# Patient Record
Sex: Male | Born: 1955 | Race: White | Hispanic: No | Marital: Married | Smoking: Never smoker
Health system: Southern US, Community
[De-identification: ages and names within clinical notes are randomized; demographics above are authoritative.]

## PROBLEM LIST (undated history)

## (undated) DIAGNOSIS — I10 Essential (primary) hypertension: Secondary | ICD-10-CM

## (undated) DIAGNOSIS — N289 Disorder of kidney and ureter, unspecified: Secondary | ICD-10-CM

## (undated) DIAGNOSIS — IMO0001 Reserved for inherently not codable concepts without codable children: Secondary | ICD-10-CM

## (undated) DIAGNOSIS — G4733 Obstructive sleep apnea (adult) (pediatric): Secondary | ICD-10-CM

## (undated) DIAGNOSIS — R748 Abnormal levels of other serum enzymes: Secondary | ICD-10-CM

## (undated) DIAGNOSIS — R161 Splenomegaly, not elsewhere classified: Secondary | ICD-10-CM

## (undated) DIAGNOSIS — R3915 Urgency of urination: Secondary | ICD-10-CM

## (undated) DIAGNOSIS — I351 Nonrheumatic aortic (valve) insufficiency: Secondary | ICD-10-CM

## (undated) DIAGNOSIS — E781 Pure hyperglyceridemia: Secondary | ICD-10-CM

## (undated) DIAGNOSIS — M1990 Unspecified osteoarthritis, unspecified site: Secondary | ICD-10-CM

## (undated) DIAGNOSIS — R7303 Prediabetes: Secondary | ICD-10-CM

## (undated) DIAGNOSIS — I2510 Atherosclerotic heart disease of native coronary artery without angina pectoris: Secondary | ICD-10-CM

## (undated) DIAGNOSIS — Z9989 Dependence on other enabling machines and devices: Secondary | ICD-10-CM

## (undated) DIAGNOSIS — Z87442 Personal history of urinary calculi: Secondary | ICD-10-CM

## (undated) DIAGNOSIS — N5089 Other specified disorders of the male genital organs: Secondary | ICD-10-CM

## (undated) HISTORY — PX: UVULOPALATOPHARYNGOPLASTY: SHX827

## (undated) HISTORY — PX: APPENDECTOMY: SHX54

## (undated) HISTORY — PX: TONSILLECTOMY: SUR1361

## (undated) HISTORY — PX: COLONOSCOPY: SHX174

## (undated) HISTORY — PX: LITHOTRIPSY: SUR834

---

## 2005-06-23 ENCOUNTER — Ambulatory Visit: Admission: RE | Admit: 2005-06-23 | Discharge: 2005-06-23 | Payer: Self-pay | Admitting: Otolaryngology

## 2005-07-03 ENCOUNTER — Ambulatory Visit: Payer: Self-pay | Admitting: Pulmonary Disease

## 2006-04-01 ENCOUNTER — Ambulatory Visit (HOSPITAL_COMMUNITY): Admission: RE | Admit: 2006-04-01 | Discharge: 2006-04-01 | Payer: Self-pay | Admitting: Unknown Physician Specialty

## 2006-12-29 ENCOUNTER — Ambulatory Visit: Payer: Self-pay | Admitting: Internal Medicine

## 2006-12-29 ENCOUNTER — Encounter (INDEPENDENT_AMBULATORY_CARE_PROVIDER_SITE_OTHER): Payer: Self-pay | Admitting: *Deleted

## 2006-12-29 ENCOUNTER — Ambulatory Visit (HOSPITAL_COMMUNITY): Admission: RE | Admit: 2006-12-29 | Discharge: 2006-12-29 | Payer: Self-pay | Admitting: Internal Medicine

## 2008-11-18 HISTORY — PX: KNEE ARTHROSCOPY: SUR90

## 2010-01-15 ENCOUNTER — Inpatient Hospital Stay (HOSPITAL_COMMUNITY): Admission: EM | Admit: 2010-01-15 | Discharge: 2010-01-20 | Payer: Self-pay | Admitting: Emergency Medicine

## 2010-01-15 ENCOUNTER — Ambulatory Visit (HOSPITAL_COMMUNITY): Admission: RE | Admit: 2010-01-15 | Discharge: 2010-01-15 | Payer: Self-pay | Admitting: Preventative Medicine

## 2010-01-16 ENCOUNTER — Encounter (INDEPENDENT_AMBULATORY_CARE_PROVIDER_SITE_OTHER): Payer: Self-pay | Admitting: General Surgery

## 2010-01-16 HISTORY — PX: OTHER SURGICAL HISTORY: SHX169

## 2010-07-17 ENCOUNTER — Encounter (HOSPITAL_COMMUNITY): Admission: RE | Admit: 2010-07-17 | Discharge: 2010-08-16 | Payer: Self-pay | Admitting: Orthopedic Surgery

## 2010-07-26 ENCOUNTER — Ambulatory Visit (HOSPITAL_COMMUNITY): Admission: RE | Admit: 2010-07-26 | Discharge: 2010-07-26 | Payer: Self-pay | Admitting: General Surgery

## 2010-08-20 ENCOUNTER — Ambulatory Visit (HOSPITAL_COMMUNITY): Admission: RE | Admit: 2010-08-20 | Discharge: 2010-08-20 | Payer: Self-pay | Admitting: Orthopedic Surgery

## 2010-09-12 ENCOUNTER — Encounter (HOSPITAL_COMMUNITY): Admission: RE | Admit: 2010-09-12 | Discharge: 2010-10-12 | Payer: Self-pay | Admitting: Orthopedic Surgery

## 2010-10-01 ENCOUNTER — Emergency Department (HOSPITAL_COMMUNITY): Admission: EM | Admit: 2010-10-01 | Discharge: 2010-10-02 | Payer: Self-pay | Admitting: Emergency Medicine

## 2010-10-02 ENCOUNTER — Ambulatory Visit (HOSPITAL_COMMUNITY): Admission: RE | Admit: 2010-10-02 | Discharge: 2010-10-02 | Payer: Self-pay | Admitting: Urology

## 2010-10-03 ENCOUNTER — Emergency Department (HOSPITAL_COMMUNITY): Admission: EM | Admit: 2010-10-03 | Discharge: 2010-10-03 | Payer: Self-pay | Admitting: Emergency Medicine

## 2010-11-27 ENCOUNTER — Ambulatory Visit (HOSPITAL_COMMUNITY)
Admission: RE | Admit: 2010-11-27 | Discharge: 2010-11-27 | Payer: Self-pay | Source: Home / Self Care | Attending: Urology | Admitting: Urology

## 2010-11-30 ENCOUNTER — Ambulatory Visit (HOSPITAL_COMMUNITY)
Admission: RE | Admit: 2010-11-30 | Discharge: 2010-11-30 | Payer: Self-pay | Source: Home / Self Care | Attending: Urology | Admitting: Urology

## 2011-01-29 LAB — URINE MICROSCOPIC-ADD ON

## 2011-01-29 LAB — URINALYSIS, ROUTINE W REFLEX MICROSCOPIC
Leukocytes, UA: NEGATIVE
Nitrite: NEGATIVE
Specific Gravity, Urine: 1.02 (ref 1.005–1.030)
Urobilinogen, UA: 0.2 mg/dL (ref 0.0–1.0)

## 2011-02-07 LAB — CROSSMATCH
ABO/RH(D): A NEG
Antibody Screen: NEGATIVE

## 2011-02-07 LAB — CBC
MCHC: 35.1 g/dL (ref 30.0–36.0)
MCV: 92.5 fL (ref 78.0–100.0)
Platelets: 260 10*3/uL (ref 150–400)

## 2011-02-07 LAB — DIFFERENTIAL
Eosinophils Relative: 0 % (ref 0–5)
Lymphocytes Relative: 17 % (ref 12–46)
Lymphs Abs: 2 10*3/uL (ref 0.7–4.0)
Neutro Abs: 7.9 10*3/uL — ABNORMAL HIGH (ref 1.7–7.7)

## 2011-02-07 LAB — URINALYSIS, ROUTINE W REFLEX MICROSCOPIC
Ketones, ur: NEGATIVE mg/dL
Leukocytes, UA: NEGATIVE
Nitrite: NEGATIVE

## 2011-02-07 LAB — COMPREHENSIVE METABOLIC PANEL
AST: 43 U/L — ABNORMAL HIGH (ref 0–37)
Albumin: 3.8 g/dL (ref 3.5–5.2)
CO2: 26 mEq/L (ref 19–32)
Calcium: 9.4 mg/dL (ref 8.4–10.5)
Creatinine, Ser: 1.33 mg/dL (ref 0.4–1.5)
GFR calc Af Amer: >60 mL/min (ref >60)
GFR calc non Af Amer: 56 mL/min — ABNORMAL LOW (ref >60)

## 2011-02-07 LAB — ABO/RH: ABO/RH(D): A NEG

## 2011-02-11 LAB — BASIC METABOLIC PANEL
BUN: 11 mg/dL (ref 6–23)
BUN: 9 mg/dL (ref 6–23)
CO2: 29 mEq/L (ref 19–32)
CO2: 31 mEq/L (ref 19–32)
Calcium: 8.6 mg/dL (ref 8.4–10.5)
Chloride: 100 mEq/L (ref 96–112)
Chloride: 103 mEq/L (ref 96–112)
Creatinine, Ser: 1.02 mg/dL (ref 0.4–1.5)
Creatinine, Ser: 1.05 mg/dL (ref 0.4–1.5)
GFR calc Af Amer: >60 mL/min (ref >60)
GFR calc Af Amer: >60 mL/min (ref >60)
GFR calc non Af Amer: >60 mL/min (ref >60)
Glucose, Bld: 109 mg/dL — ABNORMAL HIGH (ref 70–99)
Potassium: 4.1 mEq/L (ref 3.5–5.1)
Sodium: 137 mEq/L (ref 135–145)

## 2011-02-11 LAB — CBC
HCT: 37.6 % — ABNORMAL LOW (ref 39.0–52.0)
Hemoglobin: 14.2 g/dL (ref 13.0–17.0)
MCHC: 34.7 g/dL (ref 30.0–36.0)
MCV: 93.1 fL (ref 78.0–100.0)
MCV: 93.3 fL (ref 78.0–100.0)
Platelets: 210 10*3/uL (ref 150–400)
Platelets: 300 10*3/uL (ref 150–400)
RBC: 3.79 MIL/uL — ABNORMAL LOW (ref 4.22–5.81)
RBC: 4.38 MIL/uL (ref 4.22–5.81)
RDW: 12.8 % (ref 11.5–15.5)
WBC: 9.3 10*3/uL (ref 4.0–10.5)

## 2011-02-11 LAB — DIFFERENTIAL
Basophils Absolute: 0 10*3/uL (ref 0.0–0.1)
Basophils Absolute: 0 10*3/uL (ref 0.0–0.1)
Basophils Relative: 0 % (ref 0–1)
Basophils Relative: 0 % (ref 0–1)
Eosinophils Absolute: 0.1 10*3/uL (ref 0.0–0.7)
Eosinophils Absolute: 0.1 10*3/uL (ref 0.0–0.7)
Eosinophils Relative: 1 % (ref 0–5)
Lymphocytes Relative: 17 % (ref 12–46)
Monocytes Absolute: 1 10*3/uL (ref 0.1–1.0)
Monocytes Absolute: 1 10*3/uL (ref 0.1–1.0)
Monocytes Absolute: 1 10*3/uL (ref 0.1–1.0)
Monocytes Relative: 11 % (ref 3–12)
Monocytes Relative: 16 % — ABNORMAL HIGH (ref 3–12)

## 2011-02-11 LAB — PHOSPHORUS: Phosphorus: 3.2 mg/dL (ref 2.3–4.6)

## 2011-02-11 LAB — ALBUMIN: Albumin: 2.8 g/dL — ABNORMAL LOW (ref 3.5–5.2)

## 2011-04-05 NOTE — Op Note (Signed)
NAME:  [name], [name]              ACCOUNT NO.:  [id number]   MEDICAL RECORD NO.:  [id number]          PATIENT TYPE:  AMB   LOCATION:  DAY                           FACILITY:  [facility]   PHYSICIAN:  R. Roetta Sessions, M.D. DATE OF BIRTH:  1956-01-22   DATE OF PROCEDURE:  12/29/2006  DATE OF DISCHARGE:                               OPERATIVE REPORT   PROCEDURE:  Screening colonoscopy, cold biopsy.   INDICATIONS FOR PROCEDURE:  The patient is a 55 year old gentleman sent  over courtesy Dr. Yetta Numbers for colorectal cancer screening.  He is  devoid of any lower GI tract symptoms, he has never had his lower GI  tract imaged.  There is no family history colorectal neoplasia.  Colonoscopy is now being done as screening maneuver.  This approach has  discussed the patient at length.  Potential risks, benefits and was have  been reviewed, questions answered, he is agreeable.  Please see  documentation in the medical record.   PROCEDURE NOTE:  O2 saturation, blood pressure, pulse and respirations  monitored throughout the entire procedure.   CONSCIOUS SEDATION:  Versed 5 mg IV, Demerol 100 grams IV in divided  doses.   INSTRUMENT USED:  Pentax video chip system.   FINDINGS:  Digital rectal exam revealed no abnormalities.   ENDOSCOPIC FINDINGS:  Prep was good.  Examination of colonic mucosa was  undertaken from the rectosigmoid junction where scope was advanced  through the left, transverse, right colon to area of appendiceal  orifice, ileocecal valve and cecum.  These structures well seen  photographed for the record.  From this level scope was slowly  withdrawn.  All previously mucosal surfaces were again seen.  The  patient was noted have a diminutive 3 mm polyp in the mid sigmoid which  was cold biopsied/removed.  The remainder of colonic mucosa appeared  normal.  Scope was pulled down to the rectum.  Thorough examination of  rectal mucosa including a retroflex view of anal verge was  undertaken.  The patient had normal-appearing rectal mucosa.  The patient tolerated  the procedure well and was reacted in endoscopy.   IMPRESSION:  Normal rectum.  Diminutive sigmoid polyp, status post cold  biopsy removal.  Remainder of colon mucosa appeared normal.   RECOMMENDATIONS:  1. Follow-up on path.  2. Further recommendations to follow.      Jonathon Bellows, M.D.  Electronically Signed     RMR/MEDQ  D:  12/29/2006  T:  12/29/2006  Job:  [id number]   cc:   Kirk Ruths, M.D.  Fax: [phone]

## 2011-04-05 NOTE — Procedures (Signed)
NAME:  [name], [name]              ACCOUNT NO.:  [id number]   MEDICAL RECORD NO.:  [id number]          PATIENT TYPE:  OUT   LOCATION:  SLEEP LAB                     FACILITY:  [facility]   PHYSICIAN:  Marcelyn Bruins, M.D. [facility] DATE OF BIRTH:  26-Oct-1956   DATE OF STUDY:  06/23/2005                              NOCTURNAL POLYSOMNOGRAM   REFERRING PHYSICIAN:  Lucky Cowboy, MD   DATE OF STUDY:  June 23, 2005.   INDICATIONS FOR STUDY:  Hypersomnia with sleep apnea.   EPWORTH SCORE:  10.   SLEEP ARCHITECTURE:  The patient has total sleep time of 265 minutes with  adequate slow wave sleep but decreased REM. Sleep onset was prolonged at 45  minutes, and REM onset was fairly short at 48 minutes. Sleep efficiency was  only 69%.   IMPRESSION:  1.  Split night study reveals moderate obstructive sleep apnea with 59      obstructive events in the first 103 minutes of sleep. This gave the      patient a respiratory disturbance index of 34 events per hour and O2      desaturation as low as 85%. Events were not positional, but they were      associated with moderate to loud snoring. By protocol, the patient was      then placed on a medium Respironics mask of unknown style. CPAP pressure      was then started, and ultimately the patient required 7 cm of water      pressure in order to control both obstructive events and snoring.  2.  No clinically significant cardiac arrhythmias.                                            ______________________________  Marcelyn Bruins, M.D. [facility]  Diplomate, American Board of Sleep  Medicine     KC/MEDQ  D:  07/03/2005 11:39:14  T:  07/03/2005 12:33:00  Job:  [id number]

## 2011-09-19 ENCOUNTER — Ambulatory Visit: Payer: BC Managed Care – PPO | Attending: Otolaryngology | Admitting: Audiology

## 2011-09-19 DIAGNOSIS — Z0389 Encounter for observation for other suspected diseases and conditions ruled out: Secondary | ICD-10-CM | POA: Insufficient documentation

## 2011-09-19 DIAGNOSIS — Z0110 Encounter for examination of ears and hearing without abnormal findings: Secondary | ICD-10-CM | POA: Insufficient documentation

## 2014-03-10 ENCOUNTER — Other Ambulatory Visit: Payer: Self-pay | Admitting: Urology

## 2014-03-16 ENCOUNTER — Encounter (HOSPITAL_BASED_OUTPATIENT_CLINIC_OR_DEPARTMENT_OTHER): Payer: Self-pay | Admitting: *Deleted

## 2014-03-16 NOTE — Progress Notes (Signed)
NPO AFTER MN. ARRIVE AT 8469. NEEDS HG. WILL TAKE FENOFIBRATE AM DOS W/ SIPS OF WATER.

## 2014-03-18 ENCOUNTER — Encounter (HOSPITAL_BASED_OUTPATIENT_CLINIC_OR_DEPARTMENT_OTHER)
Admission: RE | Disposition: A | Discharge status: Home / Self Care | Payer: Self-pay | Source: Ambulatory Visit | Attending: Urology

## 2014-03-18 ENCOUNTER — Ambulatory Visit (HOSPITAL_BASED_OUTPATIENT_CLINIC_OR_DEPARTMENT_OTHER): Payer: BC Managed Care – PPO | Admitting: Anesthesiology

## 2014-03-18 ENCOUNTER — Encounter (HOSPITAL_BASED_OUTPATIENT_CLINIC_OR_DEPARTMENT_OTHER): Payer: BC Managed Care – PPO | Admitting: Anesthesiology

## 2014-03-18 ENCOUNTER — Ambulatory Visit (HOSPITAL_BASED_OUTPATIENT_CLINIC_OR_DEPARTMENT_OTHER)
Admission: RE | Admit: 2014-03-18 | Discharge: 2014-03-18 | Disposition: A | Discharge status: Home / Self Care | Payer: BC Managed Care – PPO | Source: Ambulatory Visit | Attending: Urology | Admitting: Urology

## 2014-03-18 ENCOUNTER — Encounter (HOSPITAL_BASED_OUTPATIENT_CLINIC_OR_DEPARTMENT_OTHER): Payer: Self-pay

## 2014-03-18 DIAGNOSIS — E7800 Pure hypercholesterolemia, unspecified: Secondary | ICD-10-CM | POA: Insufficient documentation

## 2014-03-18 DIAGNOSIS — N4340 Spermatocele of epididymis, unspecified: Secondary | ICD-10-CM | POA: Insufficient documentation

## 2014-03-18 DIAGNOSIS — N433 Hydrocele, unspecified: Secondary | ICD-10-CM

## 2014-03-18 DIAGNOSIS — G4730 Sleep apnea, unspecified: Secondary | ICD-10-CM | POA: Insufficient documentation

## 2014-03-18 DIAGNOSIS — Z79899 Other long term (current) drug therapy: Secondary | ICD-10-CM | POA: Insufficient documentation

## 2014-03-18 HISTORY — DX: Pure hyperglyceridemia: E78.1

## 2014-03-18 HISTORY — DX: Other specified disorders of the male genital organs: N50.89

## 2014-03-18 HISTORY — DX: Obstructive sleep apnea (adult) (pediatric): Z99.89

## 2014-03-18 HISTORY — DX: Urgency of urination: R39.15

## 2014-03-18 HISTORY — DX: Obstructive sleep apnea (adult) (pediatric): G47.33

## 2014-03-18 HISTORY — PX: HYDROCELE EXCISION: SHX482

## 2014-03-18 LAB — POCT HEMOGLOBIN-HEMACUE: Hemoglobin: 15.1 g/dL (ref 13.0–17.0)

## 2014-03-18 SURGERY — HYDROCELECTOMY
Anesthesia: General | Site: Scrotum | Laterality: Right

## 2014-03-18 MED ORDER — CEFAZOLIN SODIUM-DEXTROSE 2-3 GM-% IV SOLR
2.0000 g | INTRAVENOUS | Status: AC
  Administered 2014-03-18: 2 g via INTRAVENOUS
  Filled 2014-03-18: qty 50

## 2014-03-18 MED ORDER — FENTANYL CITRATE 0.05 MG/ML IJ SOLN
25.0000 ug | INTRAMUSCULAR | Status: DC | PRN
  Administered 2014-03-18: 25 ug via INTRAVENOUS
  Administered 2014-03-18: 50 ug via INTRAVENOUS
  Filled 2014-03-18: qty 1

## 2014-03-18 MED ORDER — LACTATED RINGERS IV SOLN
INTRAVENOUS | Status: DC
  Administered 2014-03-18 (×2): via INTRAVENOUS
  Filled 2014-03-18: qty 1000

## 2014-03-18 MED ORDER — FENTANYL CITRATE 0.05 MG/ML IJ SOLN
INTRAMUSCULAR | Status: AC
  Filled 2014-03-18: qty 4

## 2014-03-18 MED ORDER — OXYCODONE-ACETAMINOPHEN 5-325 MG PO TABS
1.0000 | ORAL_TABLET | ORAL | Status: DC | PRN
  Administered 2014-03-18: 1 via ORAL
  Filled 2014-03-18: qty 2

## 2014-03-18 MED ORDER — ONDANSETRON HCL 4 MG/2ML IJ SOLN
INTRAMUSCULAR | Status: DC | PRN
  Administered 2014-03-18: 4 mg via INTRAVENOUS

## 2014-03-18 MED ORDER — BACITRACIN-NEOMYCIN-POLYMYXIN 400-5-5000 EX OINT
TOPICAL_OINTMENT | CUTANEOUS | Status: DC | PRN
  Administered 2014-03-18: 1 via TOPICAL

## 2014-03-18 MED ORDER — LIDOCAINE HCL (CARDIAC) 20 MG/ML IV SOLN
INTRAVENOUS | Status: DC | PRN
  Administered 2014-03-18: 60 mg via INTRAVENOUS

## 2014-03-18 MED ORDER — OXYCODONE-ACETAMINOPHEN 10-325 MG PO TABS: 1.0000 | ORAL_TABLET | ORAL | Status: DC | PRN

## 2014-03-18 MED ORDER — FENTANYL CITRATE 0.05 MG/ML IJ SOLN
INTRAMUSCULAR | Status: DC | PRN
  Administered 2014-03-18 (×4): 50 ug via INTRAVENOUS

## 2014-03-18 MED ORDER — MIDAZOLAM HCL 5 MG/5ML IJ SOLN
INTRAMUSCULAR | Status: DC | PRN
  Administered 2014-03-18: 2 mg via INTRAVENOUS

## 2014-03-18 MED ORDER — FENTANYL CITRATE 0.05 MG/ML IJ SOLN
INTRAMUSCULAR | Status: AC
  Filled 2014-03-18: qty 2

## 2014-03-18 MED ORDER — BUPIVACAINE-EPINEPHRINE 0.5% -1:200000 IJ SOLN
INTRAMUSCULAR | Status: DC | PRN
  Administered 2014-03-18: 10 mL

## 2014-03-18 MED ORDER — PROMETHAZINE HCL 25 MG/ML IJ SOLN
6.2500 mg | INTRAMUSCULAR | Status: DC | PRN
  Filled 2014-03-18: qty 1

## 2014-03-18 MED ORDER — KETOROLAC TROMETHAMINE 30 MG/ML IJ SOLN
INTRAMUSCULAR | Status: DC | PRN
  Administered 2014-03-18: 30 mg via INTRAVENOUS

## 2014-03-18 MED ORDER — KETOROLAC TROMETHAMINE 30 MG/ML IJ SOLN
15.0000 mg | Freq: Once | INTRAMUSCULAR | Status: DC | PRN
  Filled 2014-03-18: qty 1

## 2014-03-18 MED ORDER — MIDAZOLAM HCL 2 MG/2ML IJ SOLN
INTRAMUSCULAR | Status: AC
  Filled 2014-03-18: qty 2

## 2014-03-18 MED ORDER — DEXAMETHASONE SODIUM PHOSPHATE 10 MG/ML IJ SOLN
INTRAMUSCULAR | Status: DC | PRN
  Administered 2014-03-18: 10 mg via INTRAVENOUS

## 2014-03-18 MED ORDER — PROPOFOL 10 MG/ML IV BOLUS
INTRAVENOUS | Status: DC | PRN
  Administered 2014-03-18: 200 mg via INTRAVENOUS

## 2014-03-18 MED ORDER — OXYCODONE-ACETAMINOPHEN 5-325 MG PO TABS
ORAL_TABLET | ORAL | Status: AC
  Filled 2014-03-18: qty 1

## 2014-03-18 MED ORDER — SODIUM CHLORIDE 0.9 % IR SOLN
Status: DC | PRN
  Administered 2014-03-18: 1

## 2014-03-18 SURGICAL SUPPLY — 45 items
BANDAGE GAUZE ELAST BULKY 4 IN (GAUZE/BANDAGES/DRESSINGS) ×2 IMPLANT
BLADE SURG 15 STRL LF DISP TIS (BLADE) ×1 IMPLANT
BLADE SURG 15 STRL SS (BLADE) ×1
BLADE SURG ROTATE 9660 (MISCELLANEOUS) ×2 IMPLANT
BNDG GAUZE ELAST 4 BULKY (GAUZE/BANDAGES/DRESSINGS) ×2 IMPLANT
CANISTER SUCTION 1200CC (MISCELLANEOUS) ×2 IMPLANT
CANISTER SUCTION 2500CC (MISCELLANEOUS) IMPLANT
CLEANER CAUTERY TIP 5X5 PAD (MISCELLANEOUS) ×1 IMPLANT
CLOTH BEACON ORANGE TIMEOUT ST (SAFETY) ×2 IMPLANT
COVER MAYO STAND STRL (DRAPES) ×2 IMPLANT
COVER TABLE BACK 60X90 (DRAPES) ×2 IMPLANT
DISSECTOR ROUND CHERRY 3/8 STR (MISCELLANEOUS) IMPLANT
DRAIN PENROSE 18X1/4 LTX STRL (WOUND CARE) IMPLANT
DRAPE PED LAPAROTOMY (DRAPES) ×2 IMPLANT
ELECT REM PT RETURN 9FT ADLT (ELECTROSURGICAL) ×2
ELECTRODE REM PT RTRN 9FT ADLT (ELECTROSURGICAL) ×1 IMPLANT
GLOVE BIO SURGEON STRL SZ8 (GLOVE) ×2 IMPLANT
GLOVE BIOGEL M 7.0 STRL (GLOVE) ×2 IMPLANT
GLOVE INDICATOR 8.0 STRL GRN (GLOVE) ×2 IMPLANT
GOWN PREVENTION PLUS LG XLONG (DISPOSABLE) IMPLANT
GOWN STRL REIN XL XLG (GOWN DISPOSABLE) IMPLANT
GOWN STRL REUS W/TWL 2XL LVL3 (GOWN DISPOSABLE) ×2 IMPLANT
GOWN STRL REUS W/TWL XL LVL3 (GOWN DISPOSABLE) ×2 IMPLANT
NEEDLE HYPO 25X1 1.5 SAFETY (NEEDLE) ×2 IMPLANT
NS IRRIG 500ML POUR BTL (IV SOLUTION) ×2 IMPLANT
PACK BASIN DAY SURGERY FS (CUSTOM PROCEDURE TRAY) ×2 IMPLANT
PAD CLEANER CAUTERY TIP 5X5 (MISCELLANEOUS) ×1
PENCIL BUTTON HOLSTER BLD 10FT (ELECTRODE) ×2 IMPLANT
SPONGE GAUZE 4X4 12PLY (GAUZE/BANDAGES/DRESSINGS) ×2 IMPLANT
SUPPORT SCROTAL LG STRP (MISCELLANEOUS) ×2 IMPLANT
SUT CHROMIC 3 0 SH 27 (SUTURE) ×6 IMPLANT
SUT ETHILON 3 0 PS 1 (SUTURE) IMPLANT
SUT SILK 4 0 (SUTURE) ×1
SUT SILK 4 0 TIES 17X18 (SUTURE) IMPLANT
SUT SILK 4-0 18XBRD TIE 12 (SUTURE) ×1 IMPLANT
SUT VIC AB 4-0 RB1 27 (SUTURE)
SUT VIC AB 4-0 RB1 27X BRD (SUTURE) IMPLANT
SUT VICRYL 6 0 RB 1 (SUTURE) IMPLANT
SYR CONTROL 10ML LL (SYRINGE) ×2 IMPLANT
TOWEL OR 17X24 6PK STRL BLUE (TOWEL DISPOSABLE) ×4 IMPLANT
TRAY DSU PREP LF (CUSTOM PROCEDURE TRAY) ×2 IMPLANT
TUBE CONNECTING 12X1/4 (SUCTIONS) ×2 IMPLANT
WATER STERILE IRR 3000ML UROMA (IV SOLUTION) IMPLANT
WATER STERILE IRR 500ML POUR (IV SOLUTION) IMPLANT
YANKAUER SUCT BULB TIP NO VENT (SUCTIONS) ×2 IMPLANT

## 2014-03-18 NOTE — Transfer of Care (Signed)
Immediate Anesthesia Transfer of Care Note  Patient: [name]  Procedure(s) Performed: Procedure(s):  RIGHT SPERMACELECTOMY (Right)  Patient Location: PACU  Anesthesia Type:General  Level of Consciousness: sedated  Airway & Oxygen Therapy: Patient Spontanous Breathing and Patient connected to nasal cannula oxygen  Post-op Assessment: Report given to PACU RN and Post -op Vital signs reviewed and stable  Post vital signs: stable  Complications: No apparent anesthesia complications

## 2014-03-18 NOTE — H&P (Signed)
History of Present Illness Right spermatocele: He was seen and evaluated by Dr. Terance Hart for this in 12/07. It transilluminated and the testicles were normal.    Microscopic hematuria: This was evaluated in 1/08 with a renal ultrasound which was normal and cystoscopy which also revealed no abnormality.  Interval history: Over the years he said he has begun to develop some discomfort. Over the past weekend he said he has noticed increased pressure. It is worsened by crossing his legs and when he sleeps. He thinks right hemiscrotal swelling has increased in size as well. It is not associated with any voiding symptoms. It's moderately severe.   Past Medical History Problems  1. History of hypercholesterolemia (V12.29) 2. History of sleep apnea (V13.89)  Surgical History Problems  1. History of Sinus Surgery 2. History of Tonsillectomy 3. History of Uvulectomy  Current Meds 1. Fenofibrate 160 MG Oral Tablet;  Therapy: (Recorded:20Apr2015) to Recorded 2. TraZODone HCl - 50 MG Oral Tablet;  Therapy: (Recorded:20Apr2015) to Recorded  Allergies Medication  1. No Known Drug Allergies  Family History Problems  1. Family history of Family Health Status Number Of Children   2 sons 2. Family history of Heart problem : Father  Social History Problems    Alcohol Use   Caffeine Use   3-5 per day   Family history of Death In The Family Mother   age 37, heart attack   Marital History - Currently Married   Never a smoker   Never smoker   Occupation:   Mudlogger   Denied: Tobacco Use (V15.82)   Two children  Review of Systems Genitourinary, constitutional, skin, eye, otolaryngeal, hematologic/lymphatic, cardiovascular, pulmonary, endocrine, musculoskeletal, gastrointestinal, neurological and psychiatric system(s) were reviewed and pertinent findings if present are noted.  Genitourinary: post-void dribbling, scrotal pain and scrotal swelling.  Gastrointestinal:  constipation.  ENT: sinus problems.  Musculoskeletal: back pain.    Vitals Vital Signs  Height: 6 ft 2 in Weight: 245 lb  BMI Calculated: 31.46 BSA Calculated: 2.37 Blood Pressure: 157 / 96 Temperature: 97.9 F Heart Rate: 94  Physical Exam Constitutional: Well nourished and well developed . No acute distress.  ENT:. The ears and nose are normal in appearance.  Neck: The appearance of the neck is normal and no neck mass is present.  Pulmonary: No respiratory distress and normal respiratory rhythm and effort.  Cardiovascular: Heart rate and rhythm are normal . No peripheral edema.  Abdomen: The abdomen is soft and nontender. No masses are palpated. No CVA tenderness. No hernias are palpable. No hepatosplenomegaly noted.  Genitourinary: Examination of the penis demonstrates no discharge, no masses, no lesions and a normal meatus. The penis is circumcised. The scrotum is without lesions. Examination of the right scrotum demonstrates a hydrocele. The right epididymis is palpably normal and non-tender. The left epididymis is palpably normal and non-tender. The right testis is non-tender and without masses. The left testis is non-tender and without masses.  Lymphatics: The femoral and inguinal nodes are not enlarged or tender.  Skin: Normal skin turgor, no visible rash and no visible skin lesions.  Neuro/Psych:. Mood and affect are appropriate.    Results/Data Urine  COLOR YELLOW  APPEARANCE CLEAR  SPECIFIC GRAVITY <1.005  pH 6.0  GLUCOSE NEG mg/dL BILIRUBIN NEG  KETONE NEG mg/dL BLOOD LARGE  PROTEIN NEG mg/dL UROBILINOGEN 0.2 mg/dL NITRITE NEG  LEUKOCYTE ESTERASE NEG  SQUAMOUS EPITHELIAL/HPF RARE  WBC 0-2 WBC/hpf RBC 0-2 RBC/hpf BACTERIA FEW  CRYSTALS NONE SEEN  CASTS NONE SEEN  Old records or history reviewed: Old chart as above.  The following clinical lab reports were reviewed:  UA: No red blood cells were seen microscopically.    Assessment  By exam he seems to have  a right hydrocele although by Dr. Elam Dutch evaluation and was described as a right spermatocele. I have discussed with the patient the fact that both of these conditions are treated in a similar fashion. We discussed aspiration which has a high probability of recurrence.  I also discussed surgical correction which would include scrotal exploration with a midline incision and either the removal of his hydrocele or spermatocele. We discussed the potential risks and complications, the probability of success, the outpatient nature of the procedure and the anticipated postoperative course. He understands and would like to proceed.   Plan  He will be scheduled for scrotal exploration and removal of either his right hydrocele or his right spermatocele.

## 2014-03-18 NOTE — Anesthesia Postprocedure Evaluation (Signed)
  Anesthesia Post-op Note  Patient: [name]  Procedure(s) Performed: Procedure(s) (LRB):  RIGHT SPERMACELECTOMY (Right)  Patient Location: PACU  Anesthesia Type: General  Level of Consciousness: awake and alert   Airway and Oxygen Therapy: Patient Spontanous Breathing  Post-op Pain: mild  Post-op Assessment: Post-op Vital signs reviewed, Patient's Cardiovascular Status Stable, Respiratory Function Stable, Patent Airway and No signs of Nausea or vomiting  Last Vitals:  Filed Vitals:   03/18/14 1245  BP: 145/81  Pulse: 61  Temp: 36.5 C  Resp: 18    Post-op Vital Signs: stable   Complications: No apparent anesthesia complications

## 2014-03-18 NOTE — Discharge Instructions (Signed)
°  HOME CARE INSTRUCTIONS FOR SCROTAL PROCEDURES ° °Wound Care & Hygiene: °You may apply an ice bag to the scrotum for the first 24 hours.  This may help decrease swelling and soreness.  You may have a dressing held in place by an athletic supporter.  You may remove the dressing in 24 hours and shower in 48 hours.  Continue to use the athletic supporter or tight briefs for at least a week. ° °Activity: °Rest today - not necessarily flat bed rest.  Just take it easy.  You should not do strenuous activities until your follow-up visit with your doctor.  You may resume light activity in 48 hours. ° °Return to Work: ° °Your doctor will advise you of this depending on the type of work you do ° °Diet: °Drink liquids or eat a light diet this evening.  You may resume a regular diet tomorrow. ° °General Expectations: °You may have a small amount of bleeding.  The scrotum may be swollen or bruised for about a week. ° °Call your Doctor if these occur: ° -persistent or heavy bleeding ° -temperature of 101 degrees or more ° -severe pain, not relieved by your pain medication ° °Return to Doctor's Office:  *** °Call to set up and appointment. ° °Patient Signature:  __________________________________________________ ° °Nurse's Signature:  __________________________________________________ ° °Post Anesthesia Home Care Instructions ° °Activity: °Get plenty of rest for the remainder of the day. A responsible adult should stay with you for 24 hours following the procedure.  °For the next 24 hours, DO NOT: °-Drive a car °-Operate machinery °-Drink alcoholic beverages °-Take any medication unless instructed by your physician °-Make any legal decisions or sign important papers. ° °Meals: °Start with liquid foods such as gelatin or soup. Progress to regular foods as tolerated. Avoid greasy, spicy, heavy foods. If nausea and/or vomiting occur, drink only clear liquids until the nausea and/or vomiting subsides. Call your physician if vomiting  continues. ° °Special Instructions/Symptoms: °Your throat may feel dry or sore from the anesthesia or the breathing tube placed in your throat during surgery. If this causes discomfort, gargle with warm salt water. The discomfort should disappear within 24 hours. ° °

## 2014-03-18 NOTE — Anesthesia Preprocedure Evaluation (Signed)
Anesthesia Evaluation  Patient identified by MRN, date of birth, ID band Patient awake    Reviewed: Allergy & Precautions, H&P , NPO status , Patient's Chart, lab work & pertinent test results  Airway Mallampati: II TM Distance: <3 FB Neck ROM: Full    Dental no notable dental hx.    Pulmonary sleep apnea and Continuous Positive Airway Pressure Ventilation ,  breath sounds clear to auscultation  Pulmonary exam normal       Cardiovascular negative cardio ROS  Rhythm:Regular Rate:Normal     Neuro/Psych negative neurological ROS  negative psych ROS   GI/Hepatic negative GI ROS, Neg liver ROS,   Endo/Other  negative endocrine ROS  Renal/GU negative Renal ROS  negative genitourinary   Musculoskeletal negative musculoskeletal ROS (+)   Abdominal   Peds negative pediatric ROS (+)  Hematology negative hematology ROS (+)   Anesthesia Other Findings   Reproductive/Obstetrics negative OB ROS                           Anesthesia Physical Anesthesia Plan  ASA: II  Anesthesia Plan: General   Post-op Pain Management:    Induction: Intravenous  Airway Management Planned: LMA  Additional Equipment:   Intra-op Plan:   Post-operative Plan: Extubation in OR  Informed Consent: I have reviewed the patients History and Physical, chart, labs and discussed the procedure including the risks, benefits and alternatives for the proposed anesthesia with the patient or authorized representative who has indicated his/her understanding and acceptance.   Dental advisory given  Plan Discussed with: CRNA and Surgeon  Anesthesia Plan Comments:         Anesthesia Quick Evaluation

## 2014-03-18 NOTE — Op Note (Signed)
PATIENT:  [name]  PRE-OPERATIVE DIAGNOSIS: Righ versus spermatocele  POST-OPERATIVE DIAGNOSIS:  Multiple right spermatoceles  PROCEDURE:  Procedure(s): Right spermatocelectomy  SURGEON:  Claybon Jabs  INDICATION: Mr. is a 58 year old male patient who initially was found to have what was felt to be a spermatocele on the right-hand side by Dr. Terance Hart in 12/07. Over the years he began to develop some discomfort in the right hemiscrotum and noticed an increase in size. It was causing discomfort when he crosses his legs and when he sleeps. On examination he appeared to have a right hydrocele. We therefore discussed surgical management of the cystic fluid collection in the right hemiscrotum. Or  ANESTHESIA:  General  EBL:  Minimal  DRAINS: None  LOCAL MEDICATIONS USED:  1/2% Marcaine with epinephrine  SPECIMEN:  None  DISPOSITION OF SPECIMEN:  N/A  Description of procedure: After informed consent the patient was brought to the major OR, placed on the table and administered general anesthesia. His genitalia was then sterilely prepped and draped. An official timeout was then performed.  A midline median raphae scrotal incision was then made and carried down over the right-sided fluid collection. The tissue over the cystic structure was cleared using a combination of sharp and blunt technique. The the cyst was then opened, drained of clear fluid and delivered through the incision. The excess after draining 1 cystic structure there appeared to be a second and it was drained as well. This allowed me to visualize several smaller cysts associated with the epididymis. Based on my intraoperative findings it appeared that these were all spermatoceles/epididymal cysts. The excess tissue was excised with the Bovie electrocautery and then I cauterized the edges. I then reapproximated the edges posteriorly behind the epididymis with a running, locking 3-0 chromic suture. The appendix testis was  removed with the Bovie.   His testicle was then replaced in the normal anatomic position and his right hemiscrotum. I then closed the deep scrotal tissue with running 3-0 chromic suture in a locking fashion. I injected half percent Marcaine with epinephrine in the subcutaneous tissue and closed the skin with running 3-0 chromic. Neosporin, a sterile gauze dressing, fluff Kerlix and a scrotal support were applied. The patient tolerated the procedure well no intraoperative complications. Needle sponge and instrument counts were correct at the end of the operation.   PLAN OF CARE: Discharge to home after PACU  PATIENT DISPOSITION:  PACU - hemodynamically stable.

## 2014-03-21 ENCOUNTER — Encounter (HOSPITAL_BASED_OUTPATIENT_CLINIC_OR_DEPARTMENT_OTHER): Payer: Self-pay | Admitting: Urology

## 2015-01-26 ENCOUNTER — Other Ambulatory Visit (HOSPITAL_COMMUNITY): Payer: Self-pay | Admitting: Orthopaedic Surgery

## 2015-01-27 ENCOUNTER — Encounter (HOSPITAL_COMMUNITY): Payer: Self-pay | Admitting: *Deleted

## 2015-01-29 MED ORDER — CEFAZOLIN SODIUM-DEXTROSE 2-3 GM-% IV SOLR
2.0000 g | INTRAVENOUS | Status: AC
  Administered 2015-01-30: 2 g via INTRAVENOUS
  Filled 2015-01-29: qty 50

## 2015-01-30 ENCOUNTER — Ambulatory Visit (HOSPITAL_COMMUNITY): Payer: Worker's Compensation

## 2015-01-30 ENCOUNTER — Encounter (HOSPITAL_COMMUNITY)
Admission: RE | Disposition: A | Discharge status: Home / Self Care | Payer: Self-pay | Source: Ambulatory Visit | Attending: Orthopaedic Surgery

## 2015-01-30 ENCOUNTER — Ambulatory Visit (HOSPITAL_COMMUNITY): Payer: Worker's Compensation | Admitting: Anesthesiology

## 2015-01-30 ENCOUNTER — Encounter (HOSPITAL_COMMUNITY): Payer: Self-pay | Admitting: *Deleted

## 2015-01-30 ENCOUNTER — Ambulatory Visit (HOSPITAL_COMMUNITY)
Admission: RE | Admit: 2015-01-30 | Discharge: 2015-01-31 | Disposition: A | Discharge status: Home / Self Care | Payer: Worker's Compensation | Source: Ambulatory Visit | Attending: Orthopaedic Surgery | Admitting: Orthopaedic Surgery

## 2015-01-30 DIAGNOSIS — S46219A Strain of muscle, fascia and tendon of other parts of biceps, unspecified arm, initial encounter: Secondary | ICD-10-CM

## 2015-01-30 DIAGNOSIS — G4730 Sleep apnea, unspecified: Secondary | ICD-10-CM | POA: Diagnosis not present

## 2015-01-30 DIAGNOSIS — S46211A Strain of muscle, fascia and tendon of other parts of biceps, right arm, initial encounter: Secondary | ICD-10-CM | POA: Insufficient documentation

## 2015-01-30 DIAGNOSIS — X58XXXA Exposure to other specified factors, initial encounter: Secondary | ICD-10-CM | POA: Diagnosis not present

## 2015-01-30 DIAGNOSIS — Z419 Encounter for procedure for purposes other than remedying health state, unspecified: Secondary | ICD-10-CM

## 2015-01-30 HISTORY — DX: Personal history of urinary calculi: Z87.442

## 2015-01-30 HISTORY — PX: DISTAL BICEPS TENDON REPAIR: SHX1461

## 2015-01-30 HISTORY — DX: Reserved for inherently not codable concepts without codable children: IMO0001

## 2015-01-30 LAB — CBC
HCT: 43.8 % (ref 39.0–52.0)
Hemoglobin: 15.5 g/dL (ref 13.0–17.0)
MCH: 31.3 pg (ref 26.0–34.0)
MCHC: 35.4 g/dL (ref 30.0–36.0)
MCV: 88.5 fL (ref 78.0–100.0)
Platelets: 231 10*3/uL (ref 150–400)
RBC: 4.95 MIL/uL (ref 4.22–5.81)
RDW: 12.9 % (ref 11.5–15.5)
WBC: 7.9 10*3/uL (ref 4.0–10.5)

## 2015-01-30 LAB — COMPREHENSIVE METABOLIC PANEL
ALBUMIN: 4 g/dL (ref 3.5–5.2)
ALT: 42 U/L (ref 0–53)
ANION GAP: 9 (ref 5–15)
AST: 28 U/L (ref 0–37)
Alkaline Phosphatase: 49 U/L (ref 39–117)
BILIRUBIN TOTAL: 0.8 mg/dL (ref 0.3–1.2)
BUN: 13 mg/dL (ref 6–23)
CALCIUM: 9.6 mg/dL (ref 8.4–10.5)
CO2: 26 mmol/L (ref 19–32)
CREATININE: 1.1 mg/dL (ref 0.50–1.35)
Chloride: 105 mmol/L (ref 96–112)
GFR calc Af Amer: 84 mL/min — ABNORMAL LOW (ref >90)
GFR calc non Af Amer: 72 mL/min — ABNORMAL LOW (ref >90)
GLUCOSE: 92 mg/dL (ref 70–99)
Potassium: 3.8 mmol/L (ref 3.5–5.1)
Sodium: 140 mmol/L (ref 135–145)
Total Protein: 7.2 g/dL (ref 6.0–8.3)

## 2015-01-30 LAB — PROTIME-INR
INR: 1.09 (ref 0.00–1.49)
Prothrombin Time: 14.2 seconds (ref 11.6–15.2)

## 2015-01-30 SURGERY — REPAIR, TENDON, BICEPS, DISTAL
Anesthesia: General | Site: Arm Upper | Laterality: Right

## 2015-01-30 MED ORDER — EPHEDRINE SULFATE 50 MG/ML IJ SOLN
INTRAMUSCULAR | Status: DC | PRN
  Administered 2015-01-30: 10 mg via INTRAVENOUS

## 2015-01-30 MED ORDER — OXYCODONE-ACETAMINOPHEN 10-325 MG PO TABS: 1.0000 | ORAL_TABLET | Freq: Four times a day (QID) | ORAL | Status: DC | PRN

## 2015-01-30 MED ORDER — METHOCARBAMOL 1000 MG/10ML IJ SOLN
500.0000 mg | Freq: Four times a day (QID) | INTRAMUSCULAR | Status: DC | PRN
  Filled 2015-01-30: qty 5

## 2015-01-30 MED ORDER — TRAZODONE HCL 50 MG PO TABS
50.0000 mg | ORAL_TABLET | Freq: Every evening | ORAL | Status: DC | PRN
  Filled 2015-01-30: qty 1

## 2015-01-30 MED ORDER — NEOMYCIN-POLYMYXIN-HC 1 % OT SOLN
3.0000 [drp] | Freq: Every day | OTIC | Status: DC | PRN
  Filled 2015-01-30: qty 10

## 2015-01-30 MED ORDER — METHOCARBAMOL 500 MG PO TABS
500.0000 mg | ORAL_TABLET | Freq: Four times a day (QID) | ORAL | Status: DC | PRN
  Administered 2015-01-30 – 2015-01-31 (×2): 500 mg via ORAL
  Filled 2015-01-30: qty 1

## 2015-01-30 MED ORDER — ARTIFICIAL TEARS OP OINT
TOPICAL_OINTMENT | OPHTHALMIC | Status: AC
  Filled 2015-01-30: qty 3.5

## 2015-01-30 MED ORDER — ROCURONIUM BROMIDE 50 MG/5ML IV SOLN
INTRAVENOUS | Status: AC
  Filled 2015-01-30: qty 1

## 2015-01-30 MED ORDER — BUPIVACAINE HCL (PF) 0.5 % IJ SOLN
INTRAMUSCULAR | Status: DC | PRN
  Administered 2015-01-30: 10 mL

## 2015-01-30 MED ORDER — FENTANYL CITRATE 0.05 MG/ML IJ SOLN
INTRAMUSCULAR | Status: AC
  Filled 2015-01-30: qty 2

## 2015-01-30 MED ORDER — ONDANSETRON HCL 4 MG/2ML IJ SOLN
INTRAMUSCULAR | Status: AC
  Filled 2015-01-30: qty 2

## 2015-01-30 MED ORDER — PHENYLEPHRINE HCL 10 MG/ML IJ SOLN
INTRAMUSCULAR | Status: AC
  Filled 2015-01-30: qty 1

## 2015-01-30 MED ORDER — GLYCOPYRROLATE 0.2 MG/ML IJ SOLN
INTRAMUSCULAR | Status: AC
  Filled 2015-01-30: qty 2

## 2015-01-30 MED ORDER — ONDANSETRON HCL 4 MG/2ML IJ SOLN
INTRAMUSCULAR | Status: DC | PRN
  Administered 2015-01-30: 4 mg via INTRAVENOUS

## 2015-01-30 MED ORDER — BUPIVACAINE HCL (PF) 0.5 % IJ SOLN
INTRAMUSCULAR | Status: AC
  Filled 2015-01-30: qty 10

## 2015-01-30 MED ORDER — PROPOFOL 10 MG/ML IV BOLUS
INTRAVENOUS | Status: AC
  Filled 2015-01-30: qty 20

## 2015-01-30 MED ORDER — PROMETHAZINE HCL 25 MG/ML IJ SOLN: 6.2500 mg | INTRAMUSCULAR | Status: DC | PRN

## 2015-01-30 MED ORDER — KETOROLAC TROMETHAMINE 30 MG/ML IJ SOLN
INTRAMUSCULAR | Status: AC
  Filled 2015-01-30: qty 1

## 2015-01-30 MED ORDER — MEPERIDINE HCL 25 MG/ML IJ SOLN: 6.2500 mg | INTRAMUSCULAR | Status: DC | PRN

## 2015-01-30 MED ORDER — FENTANYL CITRATE 0.05 MG/ML IJ SOLN
INTRAMUSCULAR | Status: AC
  Filled 2015-01-30: qty 5

## 2015-01-30 MED ORDER — ONDANSETRON HCL 4 MG PO TABS: 4.0000 mg | ORAL_TABLET | Freq: Four times a day (QID) | ORAL | Status: DC | PRN

## 2015-01-30 MED ORDER — METOCLOPRAMIDE HCL 5 MG/ML IJ SOLN
5.0000 mg | Freq: Three times a day (TID) | INTRAMUSCULAR | Status: DC | PRN
  Filled 2015-01-30: qty 2

## 2015-01-30 MED ORDER — KETOROLAC TROMETHAMINE 30 MG/ML IJ SOLN
30.0000 mg | Freq: Once | INTRAMUSCULAR | Status: AC
  Administered 2015-01-30: 30 mg via INTRAVENOUS

## 2015-01-30 MED ORDER — EPHEDRINE SULFATE 50 MG/ML IJ SOLN
INTRAMUSCULAR | Status: AC
  Filled 2015-01-30: qty 1

## 2015-01-30 MED ORDER — MIDAZOLAM HCL 2 MG/2ML IJ SOLN
INTRAMUSCULAR | Status: AC
  Filled 2015-01-30: qty 2

## 2015-01-30 MED ORDER — FENOFIBRATE 160 MG PO TABS
160.0000 mg | ORAL_TABLET | Freq: Every day | ORAL | Status: DC
  Administered 2015-01-31: 160 mg via ORAL
  Filled 2015-01-30: qty 1

## 2015-01-30 MED ORDER — METOCLOPRAMIDE HCL 5 MG PO TABS
5.0000 mg | ORAL_TABLET | Freq: Three times a day (TID) | ORAL | Status: DC | PRN
Start: 2015-01-30 — End: 2015-01-31
  Filled 2015-01-30: qty 2

## 2015-01-30 MED ORDER — STERILE WATER FOR INJECTION IJ SOLN
INTRAMUSCULAR | Status: AC
  Filled 2015-01-30: qty 10

## 2015-01-30 MED ORDER — IBUPROFEN 200 MG PO TABS: 200.0000 mg | ORAL_TABLET | Freq: Every day | ORAL | Status: DC | PRN

## 2015-01-30 MED ORDER — METHOCARBAMOL 500 MG PO TABS
ORAL_TABLET | ORAL | Status: AC
  Filled 2015-01-30: qty 1

## 2015-01-30 MED ORDER — ARTIFICIAL TEARS OP OINT
TOPICAL_OINTMENT | OPHTHALMIC | Status: DC | PRN
  Administered 2015-01-30: 1 via OPHTHALMIC

## 2015-01-30 MED ORDER — FENTANYL CITRATE 0.05 MG/ML IJ SOLN
INTRAMUSCULAR | Status: DC | PRN
  Administered 2015-01-30 (×2): 100 ug via INTRAVENOUS

## 2015-01-30 MED ORDER — MIDAZOLAM HCL 5 MG/5ML IJ SOLN
INTRAMUSCULAR | Status: DC | PRN
  Administered 2015-01-30: 2 mg via INTRAVENOUS

## 2015-01-30 MED ORDER — METHOCARBAMOL 500 MG PO TABS: 500.0000 mg | ORAL_TABLET | Freq: Four times a day (QID) | ORAL | Status: DC

## 2015-01-30 MED ORDER — LIDOCAINE HCL (CARDIAC) 20 MG/ML IV SOLN
INTRAVENOUS | Status: AC
  Filled 2015-01-30: qty 5

## 2015-01-30 MED ORDER — LACTATED RINGERS IV SOLN: INTRAVENOUS | Status: DC

## 2015-01-30 MED ORDER — VECURONIUM BROMIDE 10 MG IV SOLR
INTRAVENOUS | Status: AC
  Filled 2015-01-30: qty 10

## 2015-01-30 MED ORDER — OXYCODONE-ACETAMINOPHEN 10-325 MG PO TABS: 1.0000 | ORAL_TABLET | ORAL | Status: DC | PRN

## 2015-01-30 MED ORDER — PROPOFOL 10 MG/ML IV BOLUS
INTRAVENOUS | Status: DC | PRN
  Administered 2015-01-30: 200 mg via INTRAVENOUS

## 2015-01-30 MED ORDER — HYDROMORPHONE HCL 1 MG/ML IJ SOLN: 0.5000 mg | INTRAMUSCULAR | Status: DC | PRN

## 2015-01-30 MED ORDER — LIDOCAINE HCL (CARDIAC) 20 MG/ML IV SOLN
INTRAVENOUS | Status: DC | PRN
  Administered 2015-01-30: 50 mg via INTRAVENOUS

## 2015-01-30 MED ORDER — ONDANSETRON HCL 4 MG/2ML IJ SOLN: 4.0000 mg | Freq: Four times a day (QID) | INTRAMUSCULAR | Status: DC | PRN

## 2015-01-30 MED ORDER — SUCCINYLCHOLINE CHLORIDE 20 MG/ML IJ SOLN
INTRAMUSCULAR | Status: DC | PRN
  Administered 2015-01-30: 120 mg via INTRAVENOUS

## 2015-01-30 MED ORDER — 0.9 % SODIUM CHLORIDE (POUR BTL) OPTIME
TOPICAL | Status: DC | PRN
  Administered 2015-01-30: 1000 mL

## 2015-01-30 MED ORDER — FENTANYL CITRATE 0.05 MG/ML IJ SOLN
25.0000 ug | INTRAMUSCULAR | Status: DC | PRN
  Administered 2015-01-30 (×3): 50 ug via INTRAVENOUS

## 2015-01-30 MED ORDER — LACTATED RINGERS IV SOLN
INTRAVENOUS | Status: DC
  Administered 2015-01-30 (×2): via INTRAVENOUS

## 2015-01-30 SURGICAL SUPPLY — 70 items
BANDAGE ELASTIC 3 VELCRO ST LF (GAUZE/BANDAGES/DRESSINGS) ×3 IMPLANT
BANDAGE ELASTIC 4 VELCRO ST LF (GAUZE/BANDAGES/DRESSINGS) ×3 IMPLANT
BLADE SURG ROTATE 9660 (MISCELLANEOUS) ×3 IMPLANT
BNDG ESMARK 4X9 LF (GAUZE/BANDAGES/DRESSINGS) ×3 IMPLANT
BNDG GAUZE ELAST 4 BULKY (GAUZE/BANDAGES/DRESSINGS) ×6 IMPLANT
BUTTON DISTAL BICEPS (Orthopedic Implant) ×3 IMPLANT
CLIP TI MEDIUM 6 (CLIP) IMPLANT
CLIP TI WIDE RED SMALL 6 (CLIP) IMPLANT
CLOSURE WOUND 1/2 X4 (GAUZE/BANDAGES/DRESSINGS) ×2
CORDS BIPOLAR (ELECTRODE) ×3 IMPLANT
COVER SURGICAL LIGHT HANDLE (MISCELLANEOUS) ×3 IMPLANT
CUFF TOURNIQUET SINGLE 18IN (TOURNIQUET CUFF) ×3 IMPLANT
CUFF TOURNIQUET SINGLE 24IN (TOURNIQUET CUFF) IMPLANT
DECANTER SPIKE VIAL GLASS SM (MISCELLANEOUS) IMPLANT
DRAPE INCISE IOBAN 66X45 STRL (DRAPES) IMPLANT
DRAPE OEC MINIVIEW 54X84 (DRAPES) IMPLANT
DRAPE U-SHAPE 47X51 STRL (DRAPES) ×3 IMPLANT
DRSG ADAPTIC 3X8 NADH LF (GAUZE/BANDAGES/DRESSINGS) ×3 IMPLANT
DRSG PAD ABDOMINAL 8X10 ST (GAUZE/BANDAGES/DRESSINGS) ×3 IMPLANT
ELECT REM PT RETURN 9FT ADLT (ELECTROSURGICAL) ×3
ELECTRODE REM PT RTRN 9FT ADLT (ELECTROSURGICAL) ×1 IMPLANT
GAUZE SPONGE 4X4 12PLY STRL (GAUZE/BANDAGES/DRESSINGS) ×3 IMPLANT
GAUZE XEROFORM 1X8 LF (GAUZE/BANDAGES/DRESSINGS) ×3 IMPLANT
GLOVE BIOGEL PI IND STRL 8 (GLOVE) ×1 IMPLANT
GLOVE BIOGEL PI INDICATOR 8 (GLOVE) ×2
GLOVE ORTHO TXT STRL SZ7.5 (GLOVE) ×3 IMPLANT
GOWN STRL REUS W/ TWL LRG LVL3 (GOWN DISPOSABLE) ×1 IMPLANT
GOWN STRL REUS W/ TWL XL LVL3 (GOWN DISPOSABLE) ×1 IMPLANT
GOWN STRL REUS W/TWL LRG LVL3 (GOWN DISPOSABLE) ×2
GOWN STRL REUS W/TWL XL LVL3 (GOWN DISPOSABLE) ×2
INSERTER BUTTON (SYSTAGENIX WOUND MANAGEMENT) ×3 IMPLANT
KIT BASIN OR (CUSTOM PROCEDURE TRAY) ×3 IMPLANT
KIT ROOM TURNOVER OR (KITS) ×3 IMPLANT
LOOP VESSEL MAXI BLUE (MISCELLANEOUS) IMPLANT
MANIFOLD NEPTUNE II (INSTRUMENTS) IMPLANT
NEEDLE 1/2 CIR CATGUT .05X1.09 (NEEDLE) IMPLANT
NEEDLE HYPO 25GX1X1/2 BEV (NEEDLE) IMPLANT
NS IRRIG 1000ML POUR BTL (IV SOLUTION) ×3 IMPLANT
PACK ORTHO EXTREMITY (CUSTOM PROCEDURE TRAY) ×3 IMPLANT
PAD ARMBOARD 7.5X6 YLW CONV (MISCELLANEOUS) ×6 IMPLANT
PAD CAST 4YDX4 CTTN HI CHSV (CAST SUPPLIES) ×2 IMPLANT
PADDING CAST ABS 4INX4YD NS (CAST SUPPLIES) ×2
PADDING CAST ABS COTTON 4X4 ST (CAST SUPPLIES) ×1 IMPLANT
PADDING CAST COTTON 4X4 STRL (CAST SUPPLIES) ×4
PIN DRILL ACL TIGHTROPE 4MM (PIN) ×3 IMPLANT
SLING ARM FOAM STRAP XLG (SOFTGOODS) ×3 IMPLANT
SPLINT FIBERGLASS 4X30 (CAST SUPPLIES) ×3 IMPLANT
SPONGE GAUZE 4X4 12PLY STER LF (GAUZE/BANDAGES/DRESSINGS) ×3 IMPLANT
SPONGE LAP 4X18 X RAY DECT (DISPOSABLE) ×3 IMPLANT
STRIP CLOSURE SKIN 1/2X4 (GAUZE/BANDAGES/DRESSINGS) ×4 IMPLANT
SUT 2 FIBERLOOP 20 STRT BLUE (SUTURE)
SUT ETHIBOND NAB CT1 #1 30IN (SUTURE) IMPLANT
SUT ETHILON 3 0 PS 1 (SUTURE) ×6 IMPLANT
SUT FIBERWIRE #2 38 T-5 BLUE (SUTURE)
SUT FIBERWIRE 2-0 18 17.9 3/8 (SUTURE)
SUT PROLENE 4 0 PS 2 18 (SUTURE) IMPLANT
SUT SILK 3 0 (SUTURE) ×2
SUT SILK 3-0 18XBRD TIE 12 (SUTURE) ×1 IMPLANT
SUT VIC AB 3-0 FS2 27 (SUTURE) IMPLANT
SUTURE 2 FIBERLOOP 20 STRT BLU (SUTURE) IMPLANT
SUTURE FIBERWR #2 38 T-5 BLUE (SUTURE) IMPLANT
SUTURE FIBERWR 2-0 18 17.9 3/8 (SUTURE) IMPLANT
SYR CONTROL 10ML LL (SYRINGE) IMPLANT
TOWEL OR 17X24 6PK STRL BLUE (TOWEL DISPOSABLE) ×6 IMPLANT
TOWEL OR 17X26 10 PK STRL BLUE (TOWEL DISPOSABLE) ×3 IMPLANT
TUBE CONNECTING 12'X1/4 (SUCTIONS) ×1
TUBE CONNECTING 12X1/4 (SUCTIONS) ×2 IMPLANT
UNDERPAD 30X30 INCONTINENT (UNDERPADS AND DIAPERS) ×3 IMPLANT
WATER STERILE IRR 1000ML POUR (IV SOLUTION) IMPLANT
YANKAUER SUCT BULB TIP NO VENT (SUCTIONS) ×3 IMPLANT

## 2015-01-30 NOTE — H&P (Signed)
[name] is an 59 y.o. male.   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  Patient had persistent weakness, pain with attempts at flexion.  MRI scan has been obtained on 12/27/2014, which shows distal biceps tendon rupture with 5 cm proximal retraction.      SOCIAL HISTORY:  Married to his wife, [name].  Does not smoke.  Rarely drinks.   FAMILY HISTORY:   Noncontributory.   CURRENT MEDICATIONS:  Fenofibrate, also fish oil.  He has not had any antibiotics in the last 6 months.   ALLERGIES:  None.    Past Medical History  Diagnosis Date  . Scrotal swelling   . Hypertriglyceridemia   . OSA on CPAP     POST  SLEEP APNEA SURGERY --- PER SLEEP STUDY 06-26-15-2006  MODERATE OSA   USES CPAP DAILY  . Urgency of urination     OCCASIONAL  . Shortness of breath dyspnea     with exertion  . History of kidney stones     Past Surgical History  Procedure Laterality Date  . 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 Uvulopalatopharyngoplasty  2000 (APPROX)    W/  DEVIATED SEPTUM REPAIR  AND TONSILLECTOMY  . Knee arthroscopy Right 2010  . Colonoscopy    . Hydrocele excision Right 03/18/2014    Procedure:  RIGHT SPERMACELECTOMY;  Surgeon: Claybon Jabs, MD;  Location: [facility];  Service: Urology;  Laterality: Right;  . Lithotripsy    . Appendectomy    . Tonsillectomy      History reviewed. No pertinent family history. Social History:  reports that he has never smoked. He has never used smokeless tobacco. He reports that he drinks alcohol. He reports that he does not use illicit drugs.  Allergies: No Known Allergies  No prescriptions prior to admission    No results found for this or any previous  visit (from the past 48 hour(s)). No results found.  ROS  Positive for sleep apnea.  He does use CPAP.  There were no vitals taken for this visit. Physical Exam   Height 6 feet 2 inches, weight 255 pounds, blood pressure 135/87.  Lungs:  Clear.  Heart:  Regular rate and rhythm.  Shoulder range of motion is full.  Proximal long head of the biceps is palpable in the bicipital groove.  Distal biceps tendon region is tender.  Palpable stump with some proximal pull back.  Lacertus appears to be intact.  He has pain with full extension of the elbow last 10 degrees.  Weakness with biceps flexion.  Some proximal migration of the muscle belly.  Medial and ulnar nerve sensation is intact.     ASSESSMENT:  Distal biceps tendon rupture from 11/03/2014.     PLAN:  We discussed options.  Plan would be repair back to bone with tight rope technique into the bicipital tuberosity of the radius.  Possible strip of Achilles tendon needed to bridge the gap discussed.  All questions answered.  He understands and requests we proceed. [name] M 01/30/2015, 7:30 AM

## 2015-01-30 NOTE — Anesthesia Procedure Notes (Signed)
Procedure Name: Intubation Date/Time: 01/30/2015 4:58 PM Performed by: Barrington Ellison Pre-anesthesia Checklist: Patient identified, Emergency Drugs available, Suction available, Patient being monitored and Timeout performed Patient Re-evaluated:Patient Re-evaluated prior to inductionOxygen Delivery Method: Circle system utilized Preoxygenation: Pre-oxygenation with 100% oxygen Intubation Type: IV induction Ventilation: Mask ventilation with difficulty, Two handed mask ventilation required and Oral airway inserted - appropriate to patient size Laryngoscope Size: Mac and 3 Grade View: Grade III Tube type: Oral Tube size: 7.5 mm Number of attempts: 2 Airway Equipment and Method: Stylet Placement Confirmation: ETT inserted through vocal cords under direct vision,  positive ETCO2 and breath sounds checked- equal and bilateral Secured at: 24 cm Tube secured with: Tape Dental Injury: Teeth and Oropharynx as per pre-operative assessment  Difficulty Due To: Difficult Airway- due to large tongue Future Recommendations: Recommend- induction with short-acting agent, and alternative techniques readily available Comments: Intubation by Dr. Lissa Hoard, anesthesiologist

## 2015-01-30 NOTE — Transfer of Care (Signed)
Immediate Anesthesia Transfer of Care Note  Patient: [name]  Procedure(s) Performed: Procedure(s): DISTAL BICEPS TENDON REPAIR (Right)  Patient Location: PACU  Anesthesia Type:General  Level of Consciousness: lethargic and responds to stimulation  Airway & Oxygen Therapy: Patient Spontanous Breathing and Patient connected to nasal cannula oxygen  Post-op Assessment: Report given to RN  Post vital signs: Reviewed and stable  Last Vitals:  Filed Vitals:   01/30/15 1313  BP: 153/87  Pulse: 67  Temp: 36.4 C  Resp: 20    Complications: No apparent anesthesia complications

## 2015-01-30 NOTE — Brief Op Note (Signed)
01/30/2015  6:51 PM  PATIENT:  [name]  59 y.o. male  PRE-OPERATIVE DIAGNOSIS:  Right Distal Biceps Rupture  POST-OPERATIVE DIAGNOSIS:  Right Distal Biceps Rupture  PROCEDURE:  Procedure(s): DISTAL BICEPS TENDON REPAIR (Right)  SURGEON:  Surgeon(s) and Role:    * Marybelle Killings, MD - Primary  PHYSICIAN ASSISTANT:   ASSISTANTS: none   ANESTHESIA:   local and general  EBL:  Total I/O In: 1000 [I.V.:1000] Out: -   BLOOD ADMINISTERED:none  DRAINS: none   LOCAL MEDICATIONS USED:  MARCAINE     SPECIMEN:  No Specimen  DISPOSITION OF SPECIMEN:  N/A  COUNTS:  YES  TOURNIQUET:   Total Tourniquet Time Documented: Upper Arm (Right) - 65 minutes Total: Upper Arm (Right) - 65 minutes   DICTATION: .Other Dictation: Dictation Number [id number]  PLAN OF CARE: Admit for overnight observation  PATIENT DISPOSITION:  PACU - hemodynamically stable.   Delay start of Pharmacological VTE agent (>24hrs) due to surgical blood loss or risk of bleeding: not applicable

## 2015-01-30 NOTE — Anesthesia Postprocedure Evaluation (Signed)
  Anesthesia Post-op Note  Patient: [name]  Procedure(s) Performed: Procedure(s): DISTAL BICEPS TENDON REPAIR (Right)  Patient Location: PACU  Anesthesia Type: General   Level of Consciousness: awake, alert  and oriented  Airway and Oxygen Therapy: Patient Spontanous Breathing  Post-op Pain: moderate  Post-op Assessment: Post-op Vital signs reviewed  Post-op Vital Signs: Reviewed  Last Vitals:  Filed Vitals:   01/30/15 2015  BP: 140/92  Pulse: 58  Temp:   Resp: 12    Complications: No apparent anesthesia complications

## 2015-01-30 NOTE — Anesthesia Preprocedure Evaluation (Addendum)
Anesthesia Evaluation  Patient identified by MRN, date of birth, ID band Patient awake    Reviewed: Allergy & Precautions, NPO status , Patient's Chart, lab work & pertinent test results  Airway Mallampati: II  TM Distance: >3 FB Neck ROM: Full    Dental no notable dental hx. (+) Teeth Intact, Dental Advisory Given   Pulmonary shortness of breath and with exertion, sleep apnea and Continuous Positive Airway Pressure Ventilation ,  breath sounds clear to auscultation  Pulmonary exam normal       Cardiovascular negative cardio ROS  Rhythm:Regular Rate:Normal     Neuro/Psych negative psych ROS   GI/Hepatic negative GI ROS, Neg liver ROS,   Endo/Other  negative endocrine ROS  Renal/GU negative Renal ROS     Musculoskeletal   Abdominal (+) + obese,   Peds  Hematology negative hematology ROS (+)   Anesthesia Other Findings   Reproductive/Obstetrics                          Anesthesia Physical Anesthesia Plan  ASA: II  Anesthesia Plan: General   Post-op Pain Management:    Induction: Intravenous  Airway Management Planned: LMA  Additional Equipment: None  Intra-op Plan:   Post-operative Plan: Extubation in OR  Informed Consent: I have reviewed the patients History and Physical, chart, labs and discussed the procedure including the risks, benefits and alternatives for the proposed anesthesia with the patient or authorized representative who has indicated his/her understanding and acceptance.   Dental advisory given  Plan Discussed with: CRNA  Anesthesia Plan Comments: (Discussed post op block if necessary)     Anesthesia Quick Evaluation

## 2015-01-30 NOTE — Interval H&P Note (Signed)
History and Physical Interval Note:  01/30/2015 1:38 PM  [name]  has presented today for surgery, with the diagnosis of Right Distal Biceps Rupture  The various methods of treatment have been discussed with the patient and family. After consideration of risks, benefits and other options for treatment, the patient has consented to  Procedure(s): [place] (Right) as a surgical intervention .  The patient's history has been reviewed, patient examined, no change in status, stable for surgery.  I have reviewed the patient's chart and labs.  Questions were answered to the patient's satisfaction.     [name] C

## 2015-01-31 ENCOUNTER — Encounter (HOSPITAL_COMMUNITY): Payer: Self-pay | Admitting: Orthopaedic Surgery

## 2015-01-31 DIAGNOSIS — S46211A Strain of muscle, fascia and tendon of other parts of biceps, right arm, initial encounter: Secondary | ICD-10-CM | POA: Diagnosis not present

## 2015-01-31 MED ORDER — OXYCODONE-ACETAMINOPHEN 5-325 MG PO TABS
1.0000 | ORAL_TABLET | ORAL | Status: DC | PRN
  Administered 2015-01-31: 2 via ORAL
  Filled 2015-01-31: qty 2

## 2015-01-31 MED ORDER — OXYCODONE-ACETAMINOPHEN 5-325 MG PO TABS: 1.0000 | ORAL_TABLET | ORAL | Status: DC | PRN

## 2015-01-31 MED ORDER — METHOCARBAMOL 500 MG PO TABS: 500.0000 mg | ORAL_TABLET | Freq: Four times a day (QID) | ORAL | Status: DC | PRN

## 2015-01-31 NOTE — Progress Notes (Signed)
Subjective: 1 Day Post-Op Procedure(s) (LRB): DISTAL BICEPS TENDON REPAIR (Right) Patient reports pain as moderate.    Objective: Vital signs in last 24 hours: Temp:  [97.2 F (36.2 C)-98 F (36.7 C)] 98 F (36.7 C) (03/15 0416) Pulse Rate:  [57-88] 58 (03/15 0416) Resp:  [10-20] 16 (03/15 0416) BP: (111-156)/(77-101) 111/77 mmHg (03/15 0416) SpO2:  [93 %-99 %] 99 % (03/15 0416)  Intake/Output from previous day: 03/14 0701 - 03/15 0700 In: 1000 [I.V.:1000] Out: -  Intake/Output this shift:     Recent Labs  01/30/15 1300  HGB 15.5    Recent Labs  01/30/15 1300  WBC 7.9  RBC 4.95  HCT 43.8  PLT 231    Recent Labs  01/30/15 1300  NA 140  K 3.8  CL 105  CO2 26  BUN 13  CREATININE 1.10  GLUCOSE 92  CALCIUM 9.6    Recent Labs  01/30/15 1300  INR 1.09    Neurologically intact  Assessment/Plan: 1 Day Post-Op Procedure(s) (LRB): DISTAL BICEPS TENDON REPAIR (Right) Plan:  Discharge Home. Office one week.  YATES,MARK C 01/31/2015, 7:51 AM

## 2015-01-31 NOTE — Progress Notes (Signed)
Patient alert and oriented, mae's well, voiding adequate amount of urine, swallowing without difficulty, no c/o pain. Patient discharged home with family. Script and discharged instructions given to patient. Patient and family stated understanding of d/c instructions given and has an appointment with MD. Aisha Derreck Wiltsey RN 

## 2015-01-31 NOTE — Op Note (Signed)
[name], [name]              ACCOUNT NO.:  [id number]  MEDICAL RECORD NO.:  [id number]  LOCATION:  3C09C                        FACILITY:  [facility]  PHYSICIAN:  [name], M.D.    DATE OF BIRTH:  06-17-1956  DATE OF PROCEDURE:  01/30/2015 DATE OF DISCHARGE:                              OPERATIVE REPORT   PREOPERATIVE DIAGNOSIS:  Right distal biceps tendon rupture.  POSTOPERATIVE DIAGNOSIS:  Right distal biceps tendon rupture.  PROCEDURE:  Right distal biceps tendon repair.  TightRope suture x1.  SURGEON:  [name], M.D.  TOURNIQUET TIME:  1 hour and 10 minutes.  COMPLICATIONS:  None.  INDICATIONS:  This 59 year old male ruptured his biceps at work with lifting and had sharp pain, some ecchymosis.  He had delayed treatment, eventually had an MRI scan which showed distal biceps tendon tear with retraction of slightly more than 6 cm.  Due to persistent pain with activity and weakness, he requested repair.  Long discussion with the patient that it might not be able to stretch back down and might require either graft or repair might not be able to be performed.  PROCEDURE:  After standard prepping and draping, preoperative Ancef prophylaxis, the patient in the supine position on the arm table, proximal arm tourniquet was applied as far as possible.  Standard prepping including the finger tips with DuraPrep, Ancef, prophylaxis, time-out procedure, extremity, U sheet followed by extremity sheet was applied.  Sterile skin marker was used for a Lazy-S shaped incision across the antecubital fossa.  Incision was made in the transverse branch between the basilic vein and the cephalic vein had to be divided in the antecubital fossa for access.  There was extensive fibrosis over the biceps tendon and __________ was still intact.  The medial antebrachial cutaneous terminal branch with musculocutaneous nerve was freed up.  Along the medial aspect of the biceps tendon, the  brachial artery was visualized as well as the median nerve.  There was thickness of the stump of the biceps tendon.  Small incision was made over the fibrous tissue, and then the stump of the tendon was visualized.  With careful sharp dissection, the scar tissue was removed from around it and was gradually advanced until it was lacking about 2 cm.  It was wide about 24 mm in width and was elected to do a V-Y pull down, so U-shaped flap was cut, flipped down which gave an extra 2.5 cm and then multiple #1 Ethibond sutures were passed in a horizontal mattress repairing the proximal areas and at the junction of where it had been flipped down. Next, a FiberWire was used with the long straight needle starting at the proximal area for good 5-8 cm.  The weave was run down to the tendon reinforcing it to the tip.  It was sized at 9 distally, then slightly until it was applied through an 8 reamer.  Distally, the interval medial to the brachioradialis with supinator along the edge and careful avoidance of the neurovascular structures __________ did not need to be divided.  The biceps tubercle on the radius was palpated, scraped with a Freer to remove the soft tissue and then the pin for  the TightRope was drilled directly in the mid portion of it under direct visualization with 2 Hohmann retractors, 1 on each side, confirmed with C-arm and then over drilled just in the proximal cortex.  The __________weave was placed using the suture that had been cut at the end after it weaved all the way through the tendon removing the straight needle.  It was passed under direct vision through the awl with some difficulty, originally went down the shaft and set about  a small tiny hole that had been made with a K-wire.  It was pulled out repeated, replaced on the plastic blue handle holder.  Tension was held and then it was passed out, checked under fluoroscopy and the button was fired and then it was cinched  down directly against the bone, confirmed under fluoroscopy directly against the bone.  It was then gradually advanced switching tension and filleted very down into the hole, tied down securely after passing a single needle through the stump and then tying it together.  Elbow was able to reach full extension.  With elbow flexion and extension, the biceps tendon slid nicely in its normal interval.  There was not undue tension on any adjacent neurovascular structures.  The musculocutaneous branch was intact.  Brachial artery was intact.  Tourniquet was deflated. Operative field was dry.  The only area with some oozing was coming from the hole that had been drilled into the radius with slight oozing around the stump where the tendon went into the bone.  Copious irrigation, reapproximation with interrupted 2-0 and then 3-0 nylon interrupted sutures.  Marcaine infiltration in the skin.  Xeroform, 4x4s, posterior splint after Webril and ABDs were applied immobilizing the wrist and elbow at 80 degrees.  The patient tolerated the procedure well.  He was transferred to recovery room in stable condition from stay overnight.     [name], M.D.     MCY/MEDQ  D:  01/30/2015  T:  01/31/2015  Job:  [id number]

## 2016-11-19 ENCOUNTER — Telehealth: Payer: Self-pay | Admitting: Internal Medicine

## 2016-11-19 NOTE — Telephone Encounter (Signed)
Letter mailed to call.  

## 2016-11-19 NOTE — Telephone Encounter (Signed)
RECALL FOR TCS °

## 2018-02-26 DIAGNOSIS — Z23 Encounter for immunization: Secondary | ICD-10-CM | POA: Diagnosis not present

## 2018-02-26 DIAGNOSIS — E6609 Other obesity due to excess calories: Secondary | ICD-10-CM | POA: Diagnosis not present

## 2018-02-26 DIAGNOSIS — M545 Low back pain: Secondary | ICD-10-CM | POA: Diagnosis not present

## 2018-02-26 DIAGNOSIS — Z6834 Body mass index (BMI) 34.0-34.9, adult: Secondary | ICD-10-CM | POA: Diagnosis not present

## 2018-03-04 ENCOUNTER — Encounter: Payer: Self-pay | Admitting: Internal Medicine

## 2018-04-08 ENCOUNTER — Ambulatory Visit (INDEPENDENT_AMBULATORY_CARE_PROVIDER_SITE_OTHER): Payer: BLUE CROSS/BLUE SHIELD

## 2018-04-08 ENCOUNTER — Ambulatory Visit: Payer: Self-pay

## 2018-04-08 DIAGNOSIS — Z1211 Encounter for screening for malignant neoplasm of colon: Secondary | ICD-10-CM

## 2018-04-08 MED ORDER — NA SULFATE-K SULFATE-MG SULF 17.5-3.13-1.6 GM/177ML PO SOLN: 1.0000 | ORAL | 0 refills | Status: DC

## 2018-04-08 NOTE — Patient Instructions (Signed)
[name]  02-22-1956 MRN: [id number]     Procedure Date: 06/05/18 Time to register: 6:30am Place to register: [facility] Procedure Time: 7:30am Scheduled provider: R. Garfield Cornea, MD    PREPARATION FOR COLONOSCOPY WITH SUPREP BOWEL PREP KIT  Note: Suprep Bowel Prep Kit is a split-dose (2day) regimen. Consumption of BOTH 6-ounce bottles is required for a complete prep.  Please notify us immediately if you are diabetic, take iron supplements, or if you are on Coumadin or any other blood thinners.                                                                                                                                                    2 DAYS BEFORE PROCEDURE:  DATE: 06/03/18  DAY: Wednesday Begin clear liquid diet AFTER your lunch meal. NO SOLID FOODS after this point.  1 DAY BEFORE PROCEDURE:  DATE: 06/04/18   DAY: Thursday Continue clear liquids the entire day - NO SOLID FOOD.     At 6:00pm: Complete steps 1 through 4 below, using ONE (1) 6-ounce bottle, before going to bed. Step 1:  Pour ONE (1) 6-ounce bottle of SUPREP liquid into the mixing container.  Step 2:  Add cool drinking water to the 16 ounce line on the container and mix.  Note: Dilute the solution concentrate as directed prior to use. Step 3:  DRINK ALL the liquid in the container. Step 4:  You MUST drink an additional two (2) or more 16 ounce containers of water  over the next one (1) hour.   Continue clear liquids only, until midnight. Do not eat or drink anything after midnight. EXCEPTION: If you take medications for your heart, blood pressure, or breathing, you may take these medications with a small amount of clear liquid.   DAY OF PROCEDURE:   DATE: 06/05/18   DAY: Friday    5 hours before your procedure at 2:30am: Step 1:  Pour ONE (1) 6-ounce bottle of SUPREP liquid into the mixing container.  Step 2:  Add cool drinking water to the 16 ounce line on the container and  mix.  Note: Dilute the solution concentrate as directed prior to use. Step 3:  DRINK ALL the liquid in the container. Step 4:  You MUST drink an additional two (2) or more 16 ounce containers of water over the next one (1) hour. You MUST complete the final glass of water at least 3 hours before your colonoscopy.   Nothing by mouth past 4:30am  You may take your morning medications with sip of water unless we have instructed otherwise.    Please see below for Dietary Information.  CLEAR LIQUIDS INCLUDE:  Water Jello (NOT red in color)   Ice Popsicles (NOT red in color)   Tea (sugar ok, no milk/cream) Powdered fruit flavored drinks  Coffee (sugar ok, no milk/cream) Gatorade/ Lemonade/ Kool-Aid  (NOT red in color)   Juice: apple, white grape, white cranberry Soft drinks  Clear bullion, consomme, broth (fat free beef/chicken/vegetable)  Carbonated beverages (any kind)  Strained chicken noodle soup Hard Candy   Remember: Clear liquids are liquids that will allow you to see your fingers on the other side of a clear glass. Be sure liquids are NOT red in color, and not cloudy, but CLEAR.  DO NOT EAT OR DRINK ANY OF THE FOLLOWING:  Dairy products of any kind   Cranberry juice Tomato juice / V8 juice   Grapefruit juice Orange juice     Red grape juice  Do not eat any solid foods, including such foods as: cereal, oatmeal, yogurt, fruits, vegetables, creamed soups, eggs, bread, crackers, pureed foods in a blender, etc.   HELPFUL HINTS FOR DRINKING PREP SOLUTION:   Make sure prep is extremely cold. Mix and refrigerate the the morning of the prep. You may also put in the freezer.   You may try mixing some Crystal Light or Country Time Lemonade if you prefer. Mix in small amounts; add more if necessary.  Try drinking through a straw  Rinse mouth with water or a mouthwash between glasses, to remove after-taste.  Try sipping on a cold beverage /ice/ popsicles between glasses of  prep.  Place a piece of sugar-free hard candy in mouth between glasses.  If you become nauseated, try consuming smaller amounts, or stretch out the time between glasses. Stop for 30-60 minutes, then slowly start back drinking.     OTHER INSTRUCTIONS  You will need a responsible adult at least 62 years of age to accompany you and drive you home. This person must remain in the waiting room during your procedure. The hospital will cancel your procedure if you do not have a responsible adult with you.   1. Wear loose fitting clothing that is easily removed. 2. Leave jewelry and other valuables at home.  3. Remove all body piercing jewelry and leave at home. 4. Total time from sign-in until discharge is approximately 2-3 hours. 5. You should go home directly after your procedure and rest. You can resume normal activities the day after your procedure. 6. The day of your procedure you should not:  Drive  Make legal decisions  Operate machinery  Drink alcohol  Return to work   You may call the office (Dept: [phone]) before 5:00pm, or page the doctor on call [phone]) after 5:00pm, for further instructions, if necessary.   Insurance Information YOU WILL NEED TO CHECK WITH YOUR INSURANCE COMPANY FOR THE BENEFITS OF COVERAGE YOU HAVE FOR THIS PROCEDURE.  UNFORTUNATELY, NOT ALL INSURANCE COMPANIES HAVE BENEFITS TO COVER ALL OR PART OF THESE TYPES OF PROCEDURES.  IT IS YOUR RESPONSIBILITY TO CHECK YOUR BENEFITS, HOWEVER, WE WILL BE GLAD TO ASSIST YOU WITH ANY CODES YOUR INSURANCE COMPANY MAY NEED.    PLEASE NOTE THAT MOST INSURANCE COMPANIES WILL NOT COVER A SCREENING COLONOSCOPY FOR PEOPLE UNDER THE AGE OF 50  IF YOU HAVE BCBS INSURANCE, YOU MAY HAVE BENEFITS FOR A SCREENING COLONOSCOPY BUT IF POLYPS ARE FOUND THE DIAGNOSIS WILL CHANGE AND THEN YOU MAY HAVE A DEDUCTIBLE THAT WILL NEED TO BE MET. SO PLEASE MAKE SURE YOU CHECK YOUR BENEFITS FOR A SCREENING COLONOSCOPY AS WELL AS A  DIAGNOSTIC COLONOSCOPY.

## 2018-04-08 NOTE — Progress Notes (Addendum)
Gastroenterology Pre-Procedure Review  Request Date:04/08/18 Requesting Physician: Larene Pickett- Delman Cheadle PA-C/Dr.Fusco ( no previous tcs)  PATIENT REVIEW QUESTIONS: The patient responded to the following health history questions as indicated:    1. Diabetes Melitis: no 2. Joint replacements in the past 12 months: no 3. Major health problems in the past 3 months: no 4. Has an artificial valve or MVP: no 5. Has a defibrillator: no 6. Has been advised in past to take antibiotics in advance of a procedure like teeth cleaning: no 7. Family history of colon cancer: no  8. Alcohol Use: no 9. History of sleep apnea: yes (cpap)  10. History of coronary artery or other vascular stents placed within the last 12 months: no 11. History of any prior anesthesia complications: no    MEDICATIONS & ALLERGIES:    Patient reports the following regarding taking any blood thinners:   Plavix? no Aspirin? no Coumadin? no Brilinta? no Xarelto? no Eliquis? no Pradaxa? no Savaysa? no Effient? no  Patient confirms/reports the following medications:  Current Outpatient Medications  Medication Sig Dispense Refill  . fenofibrate 160 MG tablet Take 160 mg by mouth daily.     Marland Kitchen ibuprofen (ADVIL,MOTRIN) 200 MG tablet Take 200 mg by mouth daily as needed (pain).    . Omega-3 Fatty Acids (FISH OIL) 1200 MG CAPS Take 1,200 mg by mouth daily.    . traZODone (DESYREL) 50 MG tablet Take 50 mg by mouth at bedtime as needed for sleep.      No current facility-administered medications for this visit.     Patient confirms/reports the following allergies:  No Known Allergies  No orders of the defined types were placed in this encounter.   AUTHORIZATION INFORMATION Primary Insurance: [place],  [place] #:[id number] Pre-Cert / Auth required: no Pre-Cert / Auth #: Helene Kelp [id number]   SCHEDULE INFORMATION: Procedure has been scheduled as follows:  Date: 06/05/18, Time: 7:30 Location: [facility] Dr.Rourk  This  Gastroenterology Pre-Precedure Review Form is being routed to the following provider(s): Neil Crouch, PA

## 2018-04-09 NOTE — Progress Notes (Signed)
Ok to schedule. Notify endo of sleep apnea. 

## 2018-04-10 NOTE — Progress Notes (Signed)
Spoke with Melanie in endo, she said we only have to notify them if it is a [facility] pt

## 2018-05-20 DIAGNOSIS — Z0001 Encounter for general adult medical examination with abnormal findings: Secondary | ICD-10-CM | POA: Diagnosis not present

## 2018-05-20 DIAGNOSIS — Z6834 Body mass index (BMI) 34.0-34.9, adult: Secondary | ICD-10-CM | POA: Diagnosis not present

## 2018-05-20 DIAGNOSIS — E782 Mixed hyperlipidemia: Secondary | ICD-10-CM | POA: Diagnosis not present

## 2018-05-20 DIAGNOSIS — L209 Atopic dermatitis, unspecified: Secondary | ICD-10-CM | POA: Diagnosis not present

## 2018-05-20 DIAGNOSIS — E6609 Other obesity due to excess calories: Secondary | ICD-10-CM | POA: Diagnosis not present

## 2018-05-20 DIAGNOSIS — E669 Obesity, unspecified: Secondary | ICD-10-CM | POA: Diagnosis not present

## 2018-05-20 DIAGNOSIS — H6091 Unspecified otitis externa, right ear: Secondary | ICD-10-CM | POA: Diagnosis not present

## 2018-05-27 DIAGNOSIS — E785 Hyperlipidemia, unspecified: Secondary | ICD-10-CM | POA: Diagnosis not present

## 2018-05-27 DIAGNOSIS — Z125 Encounter for screening for malignant neoplasm of prostate: Secondary | ICD-10-CM | POA: Diagnosis not present

## 2018-05-27 DIAGNOSIS — R945 Abnormal results of liver function studies: Secondary | ICD-10-CM | POA: Diagnosis not present

## 2018-05-27 DIAGNOSIS — E782 Mixed hyperlipidemia: Secondary | ICD-10-CM | POA: Diagnosis not present

## 2018-06-05 ENCOUNTER — Other Ambulatory Visit: Payer: Self-pay

## 2018-06-05 ENCOUNTER — Encounter (HOSPITAL_COMMUNITY): Payer: Self-pay | Admitting: *Deleted

## 2018-06-05 ENCOUNTER — Encounter (HOSPITAL_COMMUNITY)
Admission: RE | Disposition: A | Discharge status: Home / Self Care | Payer: Self-pay | Source: Ambulatory Visit | Attending: Internal Medicine

## 2018-06-05 ENCOUNTER — Ambulatory Visit (HOSPITAL_COMMUNITY)
Admission: RE | Admit: 2018-06-05 | Discharge: 2018-06-05 | Disposition: A | Discharge status: Home / Self Care | Payer: BLUE CROSS/BLUE SHIELD | Source: Ambulatory Visit | Attending: Internal Medicine | Admitting: Internal Medicine

## 2018-06-05 DIAGNOSIS — G4733 Obstructive sleep apnea (adult) (pediatric): Secondary | ICD-10-CM | POA: Insufficient documentation

## 2018-06-05 DIAGNOSIS — K5730 Diverticulosis of large intestine without perforation or abscess without bleeding: Secondary | ICD-10-CM | POA: Diagnosis not present

## 2018-06-05 DIAGNOSIS — Z79899 Other long term (current) drug therapy: Secondary | ICD-10-CM | POA: Diagnosis not present

## 2018-06-05 DIAGNOSIS — D122 Benign neoplasm of ascending colon: Secondary | ICD-10-CM | POA: Diagnosis not present

## 2018-06-05 DIAGNOSIS — Z1211 Encounter for screening for malignant neoplasm of colon: Secondary | ICD-10-CM | POA: Diagnosis not present

## 2018-06-05 DIAGNOSIS — D124 Benign neoplasm of descending colon: Secondary | ICD-10-CM | POA: Diagnosis not present

## 2018-06-05 DIAGNOSIS — K621 Rectal polyp: Secondary | ICD-10-CM | POA: Diagnosis not present

## 2018-06-05 DIAGNOSIS — E781 Pure hyperglyceridemia: Secondary | ICD-10-CM | POA: Diagnosis not present

## 2018-06-05 HISTORY — PX: COLONOSCOPY: SHX5424

## 2018-06-05 HISTORY — PX: POLYPECTOMY: SHX5525

## 2018-06-05 SURGERY — COLONOSCOPY
Anesthesia: Moderate Sedation

## 2018-06-05 MED ORDER — ONDANSETRON HCL 4 MG/2ML IJ SOLN
INTRAMUSCULAR | Status: AC
  Filled 2018-06-05: qty 2

## 2018-06-05 MED ORDER — SODIUM CHLORIDE 0.9 % IV SOLN
INTRAVENOUS | Status: DC
  Administered 2018-06-05: 1000 mL via INTRAVENOUS

## 2018-06-05 MED ORDER — MEPERIDINE HCL 100 MG/ML IJ SOLN
INTRAMUSCULAR | Status: AC
  Filled 2018-06-05: qty 2

## 2018-06-05 MED ORDER — MEPERIDINE HCL 100 MG/ML IJ SOLN
INTRAMUSCULAR | Status: DC | PRN
  Administered 2018-06-05 (×2): 25 mg via INTRAVENOUS

## 2018-06-05 MED ORDER — MIDAZOLAM HCL 5 MG/5ML IJ SOLN
INTRAMUSCULAR | Status: DC | PRN
  Administered 2018-06-05: 1 mg via INTRAVENOUS
  Administered 2018-06-05: 2 mg via INTRAVENOUS
  Administered 2018-06-05: 1 mg via INTRAVENOUS

## 2018-06-05 MED ORDER — MIDAZOLAM HCL 5 MG/5ML IJ SOLN
INTRAMUSCULAR | Status: AC
  Filled 2018-06-05: qty 10

## 2018-06-05 NOTE — Op Note (Signed)
[facility] Patient Name: [name] Procedure Date: 06/05/2018 7:32 AM MRN: [id number] Date of Birth: November 04, 1956 Attending MD: Norvel Richards , MD CSN: [id number] Age: 62 Admit Type: Outpatient Procedure:                Colonoscopy Indications:              Screening for colorectal malignant neoplasm Providers:                Norvel Richards, MD, Lurline Del, RN, Nelma Rothman, Technician Referring MD:              Medicines:                Midazolam 4 mg IV, Meperidine 50 mg IV Complications:            No immediate complications. Estimated Blood Loss:     Estimated blood loss was minimal. Procedure:                Pre-Anesthesia Assessment:                           - Prior to the procedure, a History and Physical                            was performed, and patient medications and                            allergies were reviewed. The patient's tolerance of                            previous anesthesia was also reviewed. The risks                            and benefits of the procedure and the sedation                            options and risks were discussed with the patient.                            All questions were answered, and informed consent                            was obtained. Prior Anticoagulants: The patient has                            taken no previous anticoagulant or antiplatelet                            agents. ASA Grade Assessment: II - A patient with                            mild systemic disease. After reviewing the risks  and benefits, the patient was deemed in                            satisfactory condition to undergo the procedure.                           After obtaining informed consent, the colonoscope                            was passed under direct vision. Throughout the                            procedure, the patient's blood pressure, pulse, and               oxygen saturations were monitored continuously. The                            CF-HQ190L ([id number]) scope was introduced through                            the anus and advanced to the the ileocolonic                            anastomosis. The colonoscopy was performed without                            difficulty. The patient tolerated the procedure                            well. The quality of the bowel preparation was                            adequate. Scope In: 7:43:10 AM Scope Out: 8:06:22 AM Scope Withdrawal Time: 0 hours 16 minutes 25 seconds  Total Procedure Duration: 0 hours 23 minutes 12 seconds  Findings:      The perianal and digital rectal examinations were normal.      Multiple medium-mouthed diverticula were found in the sigmoid colon and       descending colon.      Three semi-pedunculated polyps were found in the rectum, descending       colon and ascending colon. The polyps were 7 to 10 mm in size. These       polyps were removed with a hot snare. Resection and retrieval were       complete. Estimated blood loss was minimal. larger ascending colon polyp       was cold snared as current did not travel to the loop for hot snare       polypectomy. I elected to go ahead and place one clip on this       polypectomy site.      Four sessile polyps were found in the descending colon and ascending       colon. The polyps were 5 to 6 mm in size. These polyps were removed with       a cold snare. Resection and retrieval were complete. Estimated blood       loss was minimal.  The exam was otherwise without abnormality on direct and retroflexion       views. Impression:               - Diverticulosis in the sigmoid colon and in the                            descending colon.                           - Three 7 to 10 mm polyps in the rectum, in the                            descending colon and in the ascending colon,                            removed with a  hot snare. Resected and retrieved.                           - Four 5 to 6 mm polyps in the descending colon and                            in the ascending colon, removed with a cold snare.                            Resected and retrieved.                           - The examination was otherwise normal on direct                            and retroflexion views. Moderate Sedation:      Moderate (conscious) sedation was administered by the endoscopy nurse       and supervised by the endoscopist. The following parameters were       monitored: oxygen saturation, heart rate, blood pressure, respiratory       rate, EKG, adequacy of pulmonary ventilation, and response to care.       Total physician intraservice time was 28 minutes. Recommendation:           - Patient has a contact number available for                            emergencies. The signs and symptoms of potential                            delayed complications were discussed with the                            patient. Return to normal activities tomorrow.                            Written discharge instructions were provided to the  patient.                           - Resume previous diet.                           - Continue present medications.                           - Repeat colonoscopy date to be determined after                            pending pathology results are reviewed for                            surveillance based on pathology results.                           - Return to GI office (date not yet determined). No                            MRI until clip gone. Procedure Code(s):        --- Professional ---                           [phone], Colonoscopy, flexible; with removal of                            tumor(s), polyp(s), or other lesion(s) by snare                            technique                           G0500, Moderate sedation services provided by the                             same physician or other qualified health care                            professional performing a gastrointestinal                            endoscopic service that sedation supports,                            requiring the presence of an independent trained                            observer to assist in the monitoring of the                            patient's level of consciousness and physiological                            status; initial 15 minutes of intra-service time;  patient age 42 years or older (additional time may                            be reported with [phone], as appropriate)                           [phone], Moderate sedation services provided by the                            same physician or other qualified health care                            professional performing the diagnostic or                            therapeutic service that the sedation supports,                            requiring the presence of an independent trained                            observer to assist in the monitoring of the                            patient's level of consciousness and physiological                            status; each additional 15 minutes intraservice                            time (List separately in addition to code for                            primary service) Diagnosis Code(s):        --- Professional ---                           Z12.11, Encounter for screening for malignant                            neoplasm of colon                           K62.1, Rectal polyp                           D12.4, Benign neoplasm of descending colon                           D12.2, Benign neoplasm of ascending colon                           K57.30, Diverticulosis of large intestine without                            perforation  or abscess without bleeding CPT copyright 2017 [employer]. All rights reserved. The codes  documented in this report are preliminary and upon coder review may  be revised to meet current compliance requirements. Cristopher Estimable. Caidan Hubbert, MD Norvel Richards, MD 06/05/2018 8:16:13 AM This report has been signed electronically. Number of Addenda: 0

## 2018-06-05 NOTE — Discharge Instructions (Signed)
Colon Polyps Polyps are tissue growths inside the body. Polyps can grow in many places, including the large intestine (colon). A polyp may be a round bump or a mushroom-shaped growth. You could have one polyp or several. Most colon polyps are noncancerous (benign). However, some colon polyps can become cancerous over time. What are the causes? The exact cause of colon polyps is not known. What increases the risk? This condition is more likely to develop in people who:  Have a family history of colon cancer or colon polyps.  Are older than 49 or older than 45 if they are African American.  Have inflammatory bowel disease, such as ulcerative colitis or Crohn disease.  Are overweight.  Smoke cigarettes.  Do not get enough exercise.  Drink too much alcohol.  Eat a diet that is: ? High in fat and red meat. ? Low in fiber.  Had childhood cancer that was treated with abdominal radiation.  What are the signs or symptoms? Most polyps do not cause symptoms. If you have symptoms, they may include:  Blood coming from your rectum when having a bowel movement.  Blood in your stool.The stool may look dark red or black.  A change in bowel habits, such as constipation or diarrhea.  How is this diagnosed? This condition is diagnosed with a colonoscopy. This is a procedure that uses a lighted, flexible scope to look at the inside of your colon. How is this treated? Treatment for this condition involves removing any polyps that are found. Those polyps will then be tested for cancer. If cancer is found, your health care provider will talk to you about options for colon cancer treatment. Follow these instructions at home: Diet  Eat plenty of fiber, such as fruits, vegetables, and whole grains.  Eat foods that are high in calcium and vitamin D, such as milk, cheese, yogurt, eggs, liver, fish, and broccoli.  Limit foods high in fat, red meats, and processed meats, such as hot dogs, sausage,  bacon, and lunch meats.  Maintain a healthy weight, or lose weight if recommended by your health care provider. General instructions  Do not smoke cigarettes.  Do not drink alcohol excessively.  Keep all follow-up visits as told by your health care provider. This is important. This includes keeping regularly scheduled colonoscopies. Talk to your health care provider about when you need a colonoscopy.  Exercise every day or as told by your health care provider. Contact a health care provider if:  You have new or worsening bleeding during a bowel movement.  You have new or increased blood in your stool.  You have a change in bowel habits.  You unexpectedly lose weight. This information is not intended to replace advice given to you by your health care provider. Make sure you discuss any questions you have with your health care provider. Document Released: 07/31/2004 Document Revised: 04/11/2016 Document Reviewed: 09/25/2015 Elsevier Interactive Patient Education  [employer]. Diverticulosis Diverticulosis is a condition that develops when small pouches (diverticula) form in the wall of the large intestine (colon). The colon is where water is absorbed and stool is formed. The pouches form when the inside layer of the colon pushes through weak spots in the outer layers of the colon. You may have a few pouches or many of them. What are the causes? The cause of this condition is not known. What increases the risk? The following factors may make you more likely to develop this condition:  Being older than age  5. Your risk for this condition increases with age. Diverticulosis is rare among people younger than age 17. By age 71, many people have it.  Eating a low-fiber diet.  Having frequent constipation.  Being overweight.  Not getting enough exercise.  Smoking.  Taking over-the-counter pain medicines, like aspirin and ibuprofen.  Having a family history of  diverticulosis.  What are the signs or symptoms? In most people, there are no symptoms of this condition. If you do have symptoms, they may include:  Bloating.  Cramps in the abdomen.  Constipation or diarrhea.  Pain in the lower left side of the abdomen.  How is this diagnosed? This condition is most often diagnosed during an exam for other colon problems. Because diverticulosis usually has no symptoms, it often cannot be diagnosed independently. This condition may be diagnosed by:  Using a flexible scope to examine the colon (colonoscopy).  Taking an X-ray of the colon after dye has been put into the colon (barium enema).  Doing a CT scan.  How is this treated? You may not need treatment for this condition if you have never developed an infection related to diverticulosis. If you have had an infection before, treatment may include:  Eating a high-fiber diet. This may include eating more fruits, vegetables, and grains.  Taking a fiber supplement.  Taking a live bacteria supplement (probiotic).  Taking medicine to relax your colon.  Taking antibiotic medicines.  Follow these instructions at home:  Drink 6-8 glasses of water or more each day to prevent constipation.  Try not to strain when you have a bowel movement.  If you have had an infection before: ? Eat more fiber as directed by your health care provider or your diet and nutrition specialist (dietitian). ? Take a fiber supplement or probiotic, if your health care provider approves.  Take over-the-counter and prescription medicines only as told by your health care provider.  If you were prescribed an antibiotic, take it as told by your health care provider. Do not stop taking the antibiotic even if you start to feel better.  Keep all follow-up visits as told by your health care provider. This is important. Contact a health care provider if:  You have pain in your abdomen.  You have bloating.  You have  cramps.  You have not had a bowel movement in 3 days. Get help right away if:  Your pain gets worse.  Your bloating becomes very bad.  You have a fever or chills, and your symptoms suddenly get worse.  You vomit.  You have bowel movements that are bloody or black.  You have bleeding from your rectum. Summary  Diverticulosis is a condition that develops when small pouches (diverticula) form in the wall of the large intestine (colon).  You may have a few pouches or many of them.  This condition is most often diagnosed during an exam for other colon problems.  If you have had an infection related to diverticulosis, treatment may include increasing the fiber in your diet, taking supplements, or taking medicines. This information is not intended to replace advice given to you by your health care provider. Make sure you discuss any questions you have with your health care provider. Document Released: 08/01/2004 Document Revised: 09/23/2016 Document Reviewed: 09/23/2016 Elsevier Interactive Patient Education  2017 [place].  Colonoscopy Discharge Instructions  Read the instructions outlined below and refer to this sheet in the next few weeks. These discharge instructions provide you with general information on caring  for yourself after you leave the hospital. Your doctor may also give you specific instructions. While your treatment has been planned according to the most current medical practices available, unavoidable complications occasionally occur. If you have any problems or questions after discharge, call Dr. Gala Romney at [phone]. ACTIVITY  You may resume your regular activity, but move at a slower pace for the next 24 hours.   Take frequent rest periods for the next 24 hours.   Walking will help get rid of the air and reduce the bloated feeling in your belly (abdomen).   No driving for 24 hours (because of the medicine (anesthesia) used during the test).    Do not sign any  important legal documents or operate any machinery for 24 hours (because of the anesthesia used during the test).  NUTRITION  Drink plenty of fluids.   You may resume your normal diet as instructed by your doctor.   Begin with a light meal and progress to your normal diet. Heavy or fried foods are harder to digest and may make you feel sick to your stomach (nauseated).   Avoid alcoholic beverages for 24 hours or as instructed.  MEDICATIONS  You may resume your normal medications unless your doctor tells you otherwise.  WHAT YOU CAN EXPECT TODAY  Some feelings of bloating in the abdomen.   Passage of more gas than usual.   Spotting of blood in your stool or on the toilet paper.  IF YOU HAD POLYPS REMOVED DURING THE COLONOSCOPY:  No aspirin products for 7 days or as instructed.   No alcohol for 7 days or as instructed.   Eat a soft diet for the next 24 hours.  FINDING OUT THE RESULTS OF YOUR TEST Not all test results are available during your visit. If your test results are not back during the visit, make an appointment with your caregiver to find out the results. Do not assume everything is normal if you have not heard from your caregiver or the medical facility. It is important for you to follow up on all of your test results.  SEEK IMMEDIATE MEDICAL ATTENTION IF:  You have more than a spotting of blood in your stool.   Your belly is swollen (abdominal distention).   You are nauseated or vomiting.   You have a temperature over 101.   You have abdominal pain or discomfort that is severe or gets worse throughout the day.   Diverticulosis and colon polyp information provided  No future MRI until clip gone  Further recommendations to follow pending review of pathology report

## 2018-06-05 NOTE — H&P (Signed)
'@LOGO' @   Primary Care Physician:  [facility] Primary Gastroenterologist:  Dr. Gala Romney  Pre-Procedure History & Physical: HPI:  [name] is a 62 y.o. male is here for a screening colonoscopy. Patient reports no colonoscopy 10 years ago. No bowel symptoms. No family history of colon cancer.  Past Medical History:  Diagnosis Date  . History of kidney stones   . Hypertriglyceridemia   . OSA on CPAP    POST  SLEEP APNEA SURGERY --- PER SLEEP STUDY 06-26-15-2006  MODERATE OSA   USES CPAP DAILY  . Scrotal swelling   . Shortness of breath dyspnea    with exertion  . Urgency of urination    OCCASIONAL    Past Surgical History:  Procedure Laterality Date  . APPENDECTOMY    . COLONOSCOPY    . DISTAL BICEPS TENDON REPAIR Right 01/30/2015   Procedure: DISTAL BICEPS TENDON REPAIR;  Surgeon: Marybelle Killings, MD;  Location: [facility];  Service: Orthopedics;  Laterality: Right;  . EXPLORATION LAPAROTOMY/  PARTIAL COLECTOMY WITH RESECTION TERMINAL ILEUM (APPENDICEAL MASS)  01-16-2010   BENIGN NEOPLASM APPENDIX  . HYDROCELE EXCISION Right 03/18/2014   Procedure:  RIGHT SPERMACELECTOMY;  Surgeon: Claybon Jabs, MD;  Location: [facility];  Service: Urology;  Laterality: Right;  . KNEE ARTHROSCOPY Right 2010  . LITHOTRIPSY    . TONSILLECTOMY    . UVULOPALATOPHARYNGOPLASTY  2000 (APPROX)   W/  DEVIATED SEPTUM REPAIR  AND TONSILLECTOMY    Prior to Admission medications   Medication Sig Start Date End Date Taking? Authorizing Provider  fenofibrate 160 MG tablet Take 160 mg by mouth daily.    Yes [provider]  neomycin-polymyxin-hydrocortisone (CORTISPORIN) 3.5-10000-1 OTIC suspension Place 2 drops into the right ear 4 (four) times daily as needed (for pain).  05/20/18  Yes [provider]  Omega-3 Fatty Acids (FISH OIL) 1200 MG CAPS Take 1,200 mg by mouth daily.   Yes [provider]  traZODone (DESYREL) 50 MG tablet Take 50 mg by  mouth at bedtime.    Yes [provider]  ibuprofen (ADVIL,MOTRIN) 200 MG tablet Take 200 mg by mouth every 6 (six) hours as needed for headache or moderate pain.     [provider]  Na Sulfate-K Sulfate-Mg Sulf (SUPREP BOWEL PREP KIT) 17.5-3.13-1.6 GM/177ML SOLN Take 1 kit by mouth as directed. 04/08/18   Mahala Menghini, PA-C    Allergies as of 04/08/2018  . (No Known Allergies)    History reviewed. No pertinent family history.  Social History   Socioeconomic History  . Marital status: Married    Spouse name: Not on file  . Number of children: Not on file  . Years of education: Not on file  . Highest education level: Not on file  Occupational History  . Not on file  Social Needs  . Financial resource strain: Not on file  . Food insecurity:    Worry: Not on file    Inability: Not on file  . Transportation needs:    Medical: Not on file    Non-medical: Not on file  Tobacco Use  . Smoking status: Never Smoker  . Smokeless tobacco: Never Used  Substance and Sexual Activity  . Alcohol use: Yes    Comment: RARE  . Drug use: No  . Sexual activity: Not on file  Lifestyle  . Physical activity:    Days per week: Not on file    Minutes per session: Not  on file  . Stress: Not on file  Relationships  . Social connections:    Talks on phone: Not on file    Gets together: Not on file    Attends religious service: Not on file    Active member of club or organization: Not on file    Attends meetings of clubs or organizations: Not on file    Relationship status: Not on file  . Intimate partner violence:    Fear of current or ex partner: Not on file    Emotionally abused: Not on file    Physically abused: Not on file    Forced sexual activity: Not on file  Other Topics Concern  . Not on file  Social History Narrative  . Not on file    Review of Systems: See HPI, otherwise negative ROS  Physical Exam: BP (!) 161/96   Pulse (!) 59   Temp 97.8 F (36.6  C) (Oral)   Resp 18   Ht '6\' 2"'  (1.88 m)   Wt 260 lb (117.9 kg)   SpO2 99%   BMI 33.38 kg/m  General:   Alert,  Well-developed, well-nourished, pleasant and cooperative in NAD Lungs:  Clear throughout to auscultation.   No wheezes, crackles, or rhonchi. No acute distress. Heart:  Regular rate and rhythm; no murmurs, clicks, rubs,  or gallops. Abdomen:  Soft, nontender and nondistended. No masses, hepatosplenomegaly or hernias noted. Normal bowel sounds, without guarding, and without rebound.    Impression/Plan: [name] is now here to undergo a screening colonoscopy.  Average risk screening examination.  Risks, benefits, limitations, imponderables and alternatives regarding colonoscopy have been reviewed with the patient. Questions have been answered. All parties agreeable.     Notice:  This dictation was prepared with Dragon dictation along with smaller phrase technology. Any transcriptional errors that result from this process are unintentional and may not be corrected upon review.

## 2018-06-10 ENCOUNTER — Encounter: Payer: Self-pay | Admitting: Internal Medicine

## 2018-06-10 ENCOUNTER — Encounter (HOSPITAL_COMMUNITY): Payer: Self-pay | Admitting: Internal Medicine

## 2018-07-06 ENCOUNTER — Other Ambulatory Visit (HOSPITAL_COMMUNITY): Payer: Self-pay | Admitting: Internal Medicine

## 2018-07-06 DIAGNOSIS — R945 Abnormal results of liver function studies: Secondary | ICD-10-CM

## 2018-07-06 DIAGNOSIS — R7989 Other specified abnormal findings of blood chemistry: Secondary | ICD-10-CM

## 2018-07-12 ENCOUNTER — Emergency Department (HOSPITAL_COMMUNITY): Payer: BLUE CROSS/BLUE SHIELD

## 2018-07-12 ENCOUNTER — Other Ambulatory Visit: Payer: Self-pay

## 2018-07-12 ENCOUNTER — Emergency Department (HOSPITAL_COMMUNITY)
Admission: EM | Admit: 2018-07-12 | Discharge: 2018-07-12 | Disposition: A | Discharge status: Home / Self Care | Payer: BLUE CROSS/BLUE SHIELD | Attending: Emergency Medicine | Admitting: Emergency Medicine

## 2018-07-12 ENCOUNTER — Encounter (HOSPITAL_COMMUNITY): Payer: Self-pay

## 2018-07-12 DIAGNOSIS — R1010 Upper abdominal pain, unspecified: Secondary | ICD-10-CM | POA: Diagnosis not present

## 2018-07-12 DIAGNOSIS — R748 Abnormal levels of other serum enzymes: Secondary | ICD-10-CM

## 2018-07-12 DIAGNOSIS — R739 Hyperglycemia, unspecified: Secondary | ICD-10-CM | POA: Diagnosis not present

## 2018-07-12 DIAGNOSIS — R1012 Left upper quadrant pain: Secondary | ICD-10-CM | POA: Diagnosis not present

## 2018-07-12 DIAGNOSIS — E1165 Type 2 diabetes mellitus with hyperglycemia: Secondary | ICD-10-CM | POA: Diagnosis not present

## 2018-07-12 DIAGNOSIS — R945 Abnormal results of liver function studies: Secondary | ICD-10-CM | POA: Diagnosis not present

## 2018-07-12 DIAGNOSIS — R7989 Other specified abnormal findings of blood chemistry: Secondary | ICD-10-CM | POA: Diagnosis not present

## 2018-07-12 DIAGNOSIS — Z79899 Other long term (current) drug therapy: Secondary | ICD-10-CM | POA: Insufficient documentation

## 2018-07-12 DIAGNOSIS — R1011 Right upper quadrant pain: Secondary | ICD-10-CM | POA: Diagnosis not present

## 2018-07-12 DIAGNOSIS — R109 Unspecified abdominal pain: Secondary | ICD-10-CM

## 2018-07-12 HISTORY — DX: Disorder of kidney and ureter, unspecified: N28.9

## 2018-07-12 LAB — LIPASE, BLOOD: Lipase: 25 U/L (ref 11–51)

## 2018-07-12 LAB — CBC WITH DIFFERENTIAL/PLATELET
BASOS PCT: 0 %
Basophils Absolute: 0 10*3/uL (ref 0.0–0.1)
Eosinophils Absolute: 0 10*3/uL (ref 0.0–0.7)
Eosinophils Relative: 0 %
HEMATOCRIT: 44.3 % (ref 39.0–52.0)
Hemoglobin: 15.3 g/dL (ref 13.0–17.0)
Lymphocytes Relative: 9 %
Lymphs Abs: 1.2 10*3/uL (ref 0.7–4.0)
MCH: 32.3 pg (ref 26.0–34.0)
MCHC: 34.5 g/dL (ref 30.0–36.0)
MCV: 93.5 fL (ref 78.0–100.0)
MONO ABS: 0.8 10*3/uL (ref 0.1–1.0)
MONOS PCT: 6 %
NEUTROS ABS: 11.8 10*3/uL — AB (ref 1.7–7.7)
Neutrophils Relative %: 85 %
Platelets: 203 10*3/uL (ref 150–400)
RBC: 4.74 MIL/uL (ref 4.22–5.81)
RDW: 13.1 % (ref 11.5–15.5)
WBC: 13.8 10*3/uL — ABNORMAL HIGH (ref 4.0–10.5)

## 2018-07-12 LAB — COMPREHENSIVE METABOLIC PANEL
ALBUMIN: 4.3 g/dL (ref 3.5–5.0)
ALT: 83 U/L — ABNORMAL HIGH (ref 0–44)
AST: 51 U/L — AB (ref 15–41)
Alkaline Phosphatase: 56 U/L (ref 38–126)
Anion gap: 8 (ref 5–15)
BILIRUBIN TOTAL: 1 mg/dL (ref 0.3–1.2)
BUN: 16 mg/dL (ref 8–23)
CO2: 27 mmol/L (ref 22–32)
Calcium: 9.5 mg/dL (ref 8.9–10.3)
Chloride: 104 mmol/L (ref 98–111)
Creatinine, Ser: 1.03 mg/dL (ref 0.61–1.24)
GFR calc Af Amer: >60 mL/min (ref >60)
GFR calc non Af Amer: >60 mL/min (ref >60)
GLUCOSE: 162 mg/dL — AB (ref 70–99)
POTASSIUM: 3.7 mmol/L (ref 3.5–5.1)
Sodium: 139 mmol/L (ref 135–145)
TOTAL PROTEIN: 7.9 g/dL (ref 6.5–8.1)

## 2018-07-12 LAB — URINALYSIS, ROUTINE W REFLEX MICROSCOPIC
BACTERIA UA: NONE SEEN
Bilirubin Urine: NEGATIVE
Glucose, UA: NEGATIVE mg/dL
Ketones, ur: NEGATIVE mg/dL
Leukocytes, UA: NEGATIVE
NITRITE: NEGATIVE
PROTEIN: NEGATIVE mg/dL
Specific Gravity, Urine: 1.019 (ref 1.005–1.030)
pH: 8 (ref 5.0–8.0)

## 2018-07-12 LAB — TROPONIN I

## 2018-07-12 MED ORDER — FAMOTIDINE IN NACL 20-0.9 MG/50ML-% IV SOLN
20.0000 mg | Freq: Once | INTRAVENOUS | Status: AC
  Administered 2018-07-12: 20 mg via INTRAVENOUS
  Filled 2018-07-12: qty 50

## 2018-07-12 MED ORDER — OXYCODONE-ACETAMINOPHEN 5-325 MG PO TABS: 1.0000 | ORAL_TABLET | ORAL | 0 refills | Status: DC | PRN

## 2018-07-12 MED ORDER — ONDANSETRON HCL 4 MG/2ML IJ SOLN
4.0000 mg | Freq: Once | INTRAMUSCULAR | Status: AC
  Administered 2018-07-12: 4 mg via INTRAVENOUS
  Filled 2018-07-12: qty 2

## 2018-07-12 MED ORDER — ONDANSETRON HCL 8 MG PO TABS: 8.0000 mg | ORAL_TABLET | ORAL | 0 refills | Status: DC | PRN

## 2018-07-12 MED ORDER — SODIUM CHLORIDE 0.9 % IV BOLUS
1000.0000 mL | Freq: Once | INTRAVENOUS | Status: AC
  Administered 2018-07-12: 1000 mL via INTRAVENOUS

## 2018-07-12 MED ORDER — PANTOPRAZOLE SODIUM 40 MG PO TBEC: 40.0000 mg | DELAYED_RELEASE_TABLET | Freq: Every day | ORAL | 0 refills | Status: DC

## 2018-07-12 NOTE — Discharge Instructions (Addendum)
Your liver tests and glucose are slightly elevated.  Medication for pain, nausea, upset stomach.  Follow-up with your primary care doctor.

## 2018-07-12 NOTE — ED Triage Notes (Signed)
Pt arrives from home via POV c/o upper abdominal pain. Pt unable to rest last night and states pain began around 2200 last night.

## 2018-07-12 NOTE — ED Provider Notes (Signed)
[facility] EMERGENCY DEPARTMENT Provider Note   CSN: [id number] Arrival date & time: 07/12/18  0607     History   Chief Complaint Chief Complaint  Patient presents with  . Abdominal Pain    HPI [name] is a 62 y.o. male.  Bilateral upper abdominal pain radiating to the back since approximately 2200 last night.  No previous gallbladder pathology.  No substernal chest pain, dyspnea, fever, sweats, chills, diaphoresis, nausea, vomiting.  He reports constipation and is taking a stool softener recenttly, but no good results.  Severity of symptoms is moderate.  Nothing makes symptoms better or worse.     Past Medical History:  Diagnosis Date  . History of kidney stones   . Hypertriglyceridemia   . OSA on CPAP    POST  SLEEP APNEA SURGERY --- PER SLEEP STUDY 06-26-15-2006  MODERATE OSA   USES CPAP DAILY  . Renal disorder   . Scrotal swelling   . Shortness of breath dyspnea    with exertion  . Urgency of urination    OCCASIONAL    Patient Active Problem List   Diagnosis Date Noted  . Rupture of distal biceps tendon 01/30/2015  . Hydrocele, right 03/18/2014    Past Surgical History:  Procedure Laterality Date  . APPENDECTOMY    . COLONOSCOPY    . COLONOSCOPY N/A 06/05/2018   Procedure: COLONOSCOPY;  Surgeon: Daneil Dolin, MD;  [facility];  Service: Endoscopy;  Laterality: N/A;  7:30  . DISTAL BICEPS TENDON REPAIR Right 01/30/2015   Procedure: DISTAL BICEPS TENDON REPAIR;  Surgeon: Marybelle Killings, MD;  Location: [place];  Service: Orthopedics;  Laterality: Right;  . EXPLORATION LAPAROTOMY/  PARTIAL COLECTOMY WITH RESECTION TERMINAL ILEUM (APPENDICEAL MASS)  01-16-2010   BENIGN NEOPLASM APPENDIX  . HYDROCELE EXCISION Right 03/18/2014   Procedure:  RIGHT SPERMACELECTOMY;  Surgeon: Claybon Jabs, MD;  Location: [facility] [place];  Service: Urology;  Laterality: Right;  . KNEE ARTHROSCOPY Right 2010  . LITHOTRIPSY    . POLYPECTOMY  06/05/2018   Procedure: POLYPECTOMY;  Surgeon: Daneil Dolin, MD;  [facility];  Service: Endoscopy;;  ascending and descending  . TONSILLECTOMY    . UVULOPALATOPHARYNGOPLASTY  2000 (APPROX)   W/  DEVIATED SEPTUM REPAIR  AND TONSILLECTOMY        Home Medications    Prior to Admission medications   Medication Sig Start Date End Date Taking? Authorizing Provider  clotrimazole-betamethasone (LOTRISONE) cream  06/24/18  Yes [provider]  fenofibrate 160 MG tablet Take 160 mg by mouth daily.    Yes [provider]  neomycin-polymyxin-hydrocortisone (CORTISPORIN) 3.5-10000-1 OTIC suspension Place 2 drops into the right ear 4 (four) times daily as needed (for pain).  05/20/18  Yes [provider]  Omega-3 Fatty Acids (FISH OIL) 1200 MG CAPS Take 1,200 mg by mouth daily.   Yes [provider]  oxyCODONE-acetaminophen (PERCOCET/ROXICET) 5-325 MG tablet Take 1 tablet by mouth every 4 (four) hours as needed for severe pain.   Yes [provider]  traZODone (DESYREL) 50 MG tablet Take 50 mg by mouth at bedtime.    Yes [provider]  ibuprofen (ADVIL,MOTRIN) 200 MG tablet Take 200 mg by mouth every 6 (six) hours as needed for headache or moderate pain.     [provider]  Na Sulfate-K Sulfate-Mg Sulf (SUPREP BOWEL PREP KIT) 17.5-3.13-1.6 GM/177ML SOLN Take 1 kit by mouth as directed. 04/08/18   Bobby Rumpf,  Laureen Ochs, PA-C  ondansetron (ZOFRAN) 8 MG tablet Take 1 tablet (8 mg total) by mouth every 4 (four) hours as needed. 07/12/18   Nat Christen, MD  oxyCODONE-acetaminophen (PERCOCET/ROXICET) 5-325 MG tablet Take 1 tablet by mouth every 4 (four) hours as needed for severe pain. 07/12/18   Nat Christen, MD  pantoprazole (PROTONIX) 40 MG tablet Take 1 tablet (40 mg total) by mouth daily. 07/12/18   Nat Christen, MD    Family History History reviewed. No pertinent family history.  Social History Social History   Tobacco Use  . Smoking status:  Never Smoker  . Smokeless tobacco: Never Used  Substance Use Topics  . Alcohol use: Not Currently    Comment: RARE  . Drug use: No     Allergies   Patient has no known allergies.   Review of Systems Review of Systems  All other systems reviewed and are negative.    Physical Exam Updated Vital Signs Ht '6\' 2"'  (1.88 m)   Wt 117.9 kg   BMI 33.38 kg/m   Physical Exam  Constitutional: He is oriented to person, place, and time. He appears well-developed and well-nourished.  HENT:  Head: Normocephalic and atraumatic.  Eyes: Conjunctivae are normal.  Neck: Neck supple.  Cardiovascular: Normal rate and regular rhythm.  Pulmonary/Chest: Effort normal and breath sounds normal.  Abdominal: Soft. Bowel sounds are normal.  Minimal upper abdominal tenderness.  Musculoskeletal: Normal range of motion.  Neurological: He is alert and oriented to person, place, and time.  Skin: Skin is warm and dry.  Psychiatric: He has a normal mood and affect. His behavior is normal.  Nursing note and vitals reviewed.    ED Treatments / Results  Labs (all labs ordered are listed, but only abnormal results are displayed) Labs Reviewed  COMPREHENSIVE METABOLIC PANEL - Abnormal; Notable for the following components:      Result Value   Glucose, Bld 162 (*)    AST 51 (*)    ALT 83 (*)    All other components within normal limits  CBC WITH DIFFERENTIAL/PLATELET - Abnormal; Notable for the following components:   WBC 13.8 (*)    Neutro Abs 11.8 (*)    All other components within normal limits  URINALYSIS, ROUTINE W REFLEX MICROSCOPIC - Abnormal; Notable for the following components:   APPearance HAZY (*)    Hgb urine dipstick MODERATE (*)    All other components within normal limits  LIPASE, BLOOD  TROPONIN I    EKG EKG Interpretation  Date/Time:  Sunday July 12 2018 07:30:39 EDT Ventricular Rate:  61 PR Interval:    QRS Duration: 158 QT Interval:  447 QTC Calculation: 451 R  Axis:   -38 Text Interpretation:  Sinus rhythm Prolonged PR interval Right bundle branch block Baseline wander in lead(s) V3 Confirmed by Nat Christen [phone]) on 07/12/2018 10:08:43 AM Also confirmed by Nat Christen [phone])  on 07/12/2018 11:29:49 AM   Radiology Dg Abd 2 Views  Result Date: 07/12/2018 CLINICAL DATA:  Upper abdominal pain beginning last night. EXAM: ABDOMEN - 2 VIEW COMPARISON:  11/27/2010 FINDINGS: Moderate amount of fecal matter but within normal limits. Few small bowel air-fluid levels but without dilatation. This can be normal or can be seen with enteritis or mild reactive ileus. No free air. No abnormal calcifications or significant bone findings. IMPRESSION: Examination is probably within normal limits. Moderate amount of fecal matter but within the range of normal. Few small bowel air-fluid levels without dilatation of debatable  significance. See above Electronically Signed   By: Nelson Chimes M.D.   On: 07/12/2018 07:03    Procedures Procedures (including critical care time)  Medications Ordered in ED Medications  ondansetron (ZOFRAN) injection 4 mg (4 mg Intravenous Given 07/12/18 0736)  sodium chloride 0.9 % bolus 1,000 mL (0 mLs Intravenous Stopped 07/12/18 0819)  famotidine (PEPCID) IVPB 20 mg premix (0 mg Intravenous Stopped 07/12/18 0819)     Initial Impression / Assessment and Plan / ED Course  I have reviewed the triage vital signs and the nursing notes.  Pertinent labs & imaging results that were available during my care of the patient were reviewed by me and considered in my medical decision making (see chart for details).     Patient reports upper abdominal pain for the last multiple hours.  His liver enzymes are slightly elevated along with his glucose.  Lipase normal.  He is scheduled for an ultrasound of his gallbladder this week.  He was given IV fluids, IV Pepcid, IV Zofran in the ED.  Discharge medications Protonix 40 mg, Zofran 8 mg, Percocet.  He will  follow-up with his primary care doctor for the lab abnormalities and the ultrasound  Final Clinical Impressions(s) / ED Diagnoses   Final diagnoses:  Abdominal pain, unspecified abdominal location  Liver enzyme elevation  Hyperglycemia    ED Discharge Orders         Ordered    pantoprazole (PROTONIX) 40 MG tablet  Daily     07/12/18 0954    ondansetron (ZOFRAN) 8 MG tablet  Every 4 hours PRN     07/12/18 0954    oxyCODONE-acetaminophen (PERCOCET/ROXICET) 5-325 MG tablet  Every 4 hours PRN     07/12/18 0954           Nat Christen, MD 07/12/18 1428

## 2018-07-12 NOTE — ED Notes (Signed)
ED Provider at bedside. 

## 2018-07-16 ENCOUNTER — Ambulatory Visit (HOSPITAL_COMMUNITY)
Admission: RE | Admit: 2018-07-16 | Discharge: 2018-07-16 | Disposition: A | Discharge status: Home / Self Care | Payer: BLUE CROSS/BLUE SHIELD | Source: Ambulatory Visit | Attending: Internal Medicine | Admitting: Internal Medicine

## 2018-07-16 DIAGNOSIS — R945 Abnormal results of liver function studies: Secondary | ICD-10-CM | POA: Insufficient documentation

## 2018-07-16 DIAGNOSIS — K8020 Calculus of gallbladder without cholecystitis without obstruction: Secondary | ICD-10-CM | POA: Insufficient documentation

## 2018-07-16 DIAGNOSIS — R7989 Other specified abnormal findings of blood chemistry: Secondary | ICD-10-CM

## 2018-07-16 DIAGNOSIS — K7689 Other specified diseases of liver: Secondary | ICD-10-CM | POA: Diagnosis not present

## 2018-07-31 DIAGNOSIS — K829 Disease of gallbladder, unspecified: Secondary | ICD-10-CM | POA: Diagnosis not present

## 2018-07-31 DIAGNOSIS — G4733 Obstructive sleep apnea (adult) (pediatric): Secondary | ICD-10-CM | POA: Diagnosis not present

## 2018-07-31 DIAGNOSIS — Z681 Body mass index (BMI) 19 or less, adult: Secondary | ICD-10-CM | POA: Diagnosis not present

## 2018-07-31 DIAGNOSIS — Z1389 Encounter for screening for other disorder: Secondary | ICD-10-CM | POA: Diagnosis not present

## 2018-08-04 ENCOUNTER — Encounter: Payer: Self-pay | Admitting: General Surgery

## 2018-08-04 ENCOUNTER — Ambulatory Visit: Payer: BLUE CROSS/BLUE SHIELD | Admitting: General Surgery

## 2018-08-04 VITALS — BP 149/92 | HR 64 | Temp 97.7°F | Resp 20 | Wt 259.0 lb

## 2018-08-04 DIAGNOSIS — K8020 Calculus of gallbladder without cholecystitis without obstruction: Secondary | ICD-10-CM

## 2018-08-04 NOTE — Patient Instructions (Signed)

## 2018-08-04 NOTE — H&P (Signed)
[name]; [id number]; 03-17-1956   HPI Patient is a 62 year old white male who was referred to my care by Dr. Gerarda Fraction for evaluation treatment of cholelithiasis.  Patient had an episode of right upper quadrant and epigastric pain which required an ER visit.  He was noted on ultrasound to have cholelithiasis and steatosis.  He also had mild elevation in his liver enzyme test.  Patient denies any fever, chills, jaundice.  Does have intermittent fatty food intolerance.  He currently has 0 out of 10 abdominal pain.  This was his first episode. Past Medical History:  Diagnosis Date  . History of kidney stones   . Hypertriglyceridemia   . OSA on CPAP    POST  SLEEP APNEA SURGERY --- PER SLEEP STUDY 06-26-15-2006  MODERATE OSA   USES CPAP DAILY  . Renal disorder   . Scrotal swelling   . Shortness of breath dyspnea    with exertion  . Urgency of urination    OCCASIONAL    Past Surgical History:  Procedure Laterality Date  . APPENDECTOMY    . COLONOSCOPY    . COLONOSCOPY N/A 06/05/2018   Procedure: COLONOSCOPY;  Surgeon: Daneil Dolin, MD;  [facility];  Service: Endoscopy;  Laterality: N/A;  7:30  . DISTAL BICEPS TENDON REPAIR Right 01/30/2015   Procedure: DISTAL BICEPS TENDON REPAIR;  Surgeon: Marybelle Killings, MD;  Location: [place];  Service: Orthopedics;  Laterality: Right;  . EXPLORATION LAPAROTOMY/  PARTIAL COLECTOMY WITH RESECTION TERMINAL ILEUM (APPENDICEAL MASS)  01-16-2010   BENIGN NEOPLASM APPENDIX  . HYDROCELE EXCISION Right 03/18/2014   Procedure:  RIGHT SPERMACELECTOMY;  Surgeon: Claybon Jabs, MD;  Location: [facility];  Service: Urology;  Laterality: Right;  . KNEE ARTHROSCOPY Right 2010  . LITHOTRIPSY    . POLYPECTOMY  06/05/2018   Procedure: POLYPECTOMY;  Surgeon: Daneil Dolin, MD;  [facility];  Service: Endoscopy;;  ascending and descending  . TONSILLECTOMY    . UVULOPALATOPHARYNGOPLASTY  2000 (APPROX)   W/  DEVIATED SEPTUM  REPAIR  AND TONSILLECTOMY    History reviewed. No pertinent family history.  Current Outpatient Medications on File Prior to Visit  Medication Sig Dispense Refill  . fenofibrate 160 MG tablet Take 160 mg by mouth daily.     . Omega-3 Fatty Acids (FISH OIL) 1200 MG CAPS Take 1,200 mg by mouth daily.    . traZODone (DESYREL) 50 MG tablet Take 50 mg by mouth at bedtime.     . clotrimazole-betamethasone (LOTRISONE) cream     . Na Sulfate-K Sulfate-Mg Sulf (SUPREP BOWEL PREP KIT) 17.5-3.13-1.6 GM/177ML SOLN Take 1 kit by mouth as directed. (Patient not taking: Reported on 08/04/2018) 1 Bottle 0  . neomycin-polymyxin-hydrocortisone (CORTISPORIN) 3.5-10000-1 OTIC suspension Place 2 drops into the right ear 4 (four) times daily as needed (for pain).   0  . ondansetron (ZOFRAN) 8 MG tablet Take 1 tablet (8 mg total) by mouth every 4 (four) hours as needed. (Patient not taking: Reported on 08/04/2018) 10 tablet 0  . oxyCODONE-acetaminophen (PERCOCET/ROXICET) 5-325 MG tablet Take 1 tablet by mouth every 4 (four) hours as needed for severe pain.    Marland Kitchen oxyCODONE-acetaminophen (PERCOCET/ROXICET) 5-325 MG tablet Take 1 tablet by mouth every 4 (four) hours as needed for severe pain. (Patient not taking: Reported on 08/04/2018) 15 tablet 0  . pantoprazole (PROTONIX) 40 MG tablet Take 1 tablet (40 mg total) by mouth daily. (Patient not taking: Reported on 08/04/2018) 10  tablet 0   No current facility-administered medications on file prior to visit.     No Known Allergies  Social History   Substance and Sexual Activity  Alcohol Use Not Currently   Comment: RARE    Social History   Tobacco Use  Smoking Status Never Smoker  Smokeless Tobacco Never Used    Review of Systems  Constitutional: Negative.   HENT: Negative.   Eyes: Negative.   Respiratory: Negative.   Cardiovascular: Negative.   Gastrointestinal: Positive for abdominal pain.  Genitourinary: Negative.   Musculoskeletal: Positive for back  pain.  Skin: Negative.   Neurological: Negative.   Endo/Heme/Allergies: Negative.   Psychiatric/Behavioral: Negative.     Objective   Vitals:   08/04/18 0908  BP: (!) 149/92  Pulse: 64  Resp: 20  Temp: 97.7 F (36.5 C)    Physical Exam  Constitutional: He is oriented to person, place, and time. He appears well-developed and well-nourished. He does not appear ill.  HENT:  Head: Normocephalic and atraumatic.  Eyes: No scleral icterus.  Cardiovascular: Normal rate, regular rhythm and normal heart sounds. Exam reveals no gallop and no friction rub.  No murmur heard. Pulmonary/Chest: Effort normal and breath sounds normal. No stridor. No respiratory distress. He has no wheezes. He has no rhonchi. He has no rales.  Abdominal: Soft. Normal appearance and bowel sounds are normal. He exhibits no distension and no mass. There is no tenderness. There is negative Murphy's sign.  Neurological: He is alert and oriented to person, place, and time.  Skin: Skin is warm and dry.  Vitals reviewed. Dr. Nolon Rod notes reviewed.  Ultrasound report reviewed.  Assessment  Biliary colic, cholelithiasis, elevated liver enzyme tests Plan   Patient is scheduled for laparoscopic cholecystectomy with liver biopsy on 08/07/2018.  The risks and benefits of the procedure including bleeding, infection, hepatobiliary injury, and the possibility of an open procedure were fully explained to the patient, who gave informed consent.

## 2018-08-04 NOTE — Progress Notes (Signed)
[name]; [id number]; 06/17/1956   HPI Patient is a 62 year old white male who was referred to my care by Dr. Gerarda Fraction for evaluation treatment of cholelithiasis.  Patient had an episode of right upper quadrant and epigastric pain which required an ER visit.  He was noted on ultrasound to have cholelithiasis and steatosis.  He also had mild elevation in his liver enzyme test.  Patient denies any fever, chills, jaundice.  Does have intermittent fatty food intolerance.  He currently has 0 out of 10 abdominal pain.  This was his first episode. Past Medical History:  Diagnosis Date  . History of kidney stones   . Hypertriglyceridemia   . OSA on CPAP    POST  SLEEP APNEA SURGERY --- PER SLEEP STUDY 06-26-15-2006  MODERATE OSA   USES CPAP DAILY  . Renal disorder   . Scrotal swelling   . Shortness of breath dyspnea    with exertion  . Urgency of urination    OCCASIONAL    Past Surgical History:  Procedure Laterality Date  . APPENDECTOMY    . COLONOSCOPY    . COLONOSCOPY N/A 06/05/2018   Procedure: COLONOSCOPY;  Surgeon: Daneil Dolin, MD;  [facility];  Service: Endoscopy;  Laterality: N/A;  7:30  . DISTAL BICEPS TENDON REPAIR Right 01/30/2015   Procedure: DISTAL BICEPS TENDON REPAIR;  Surgeon: Marybelle Killings, MD;  Location: [place];  Service: Orthopedics;  Laterality: Right;  . EXPLORATION LAPAROTOMY/  PARTIAL COLECTOMY WITH RESECTION TERMINAL ILEUM (APPENDICEAL MASS)  01-16-2010   BENIGN NEOPLASM APPENDIX  . HYDROCELE EXCISION Right 03/18/2014   Procedure:  RIGHT SPERMACELECTOMY;  Surgeon: Claybon Jabs, MD;  Location: [facility];  Service: Urology;  Laterality: Right;  . KNEE ARTHROSCOPY Right 2010  . LITHOTRIPSY    . POLYPECTOMY  06/05/2018   Procedure: POLYPECTOMY;  Surgeon: Daneil Dolin, MD;  [facility];  Service: Endoscopy;;  ascending and descending  . TONSILLECTOMY    . UVULOPALATOPHARYNGOPLASTY  2000 (APPROX)   W/  DEVIATED SEPTUM  REPAIR  AND TONSILLECTOMY    History reviewed. No pertinent family history.  Current Outpatient Medications on File Prior to Visit  Medication Sig Dispense Refill  . fenofibrate 160 MG tablet Take 160 mg by mouth daily.     . Omega-3 Fatty Acids (FISH OIL) 1200 MG CAPS Take 1,200 mg by mouth daily.    . traZODone (DESYREL) 50 MG tablet Take 50 mg by mouth at bedtime.     . clotrimazole-betamethasone (LOTRISONE) cream     . Na Sulfate-K Sulfate-Mg Sulf (SUPREP BOWEL PREP KIT) 17.5-3.13-1.6 GM/177ML SOLN Take 1 kit by mouth as directed. (Patient not taking: Reported on 08/04/2018) 1 Bottle 0  . neomycin-polymyxin-hydrocortisone (CORTISPORIN) 3.5-10000-1 OTIC suspension Place 2 drops into the right ear 4 (four) times daily as needed (for pain).   0  . ondansetron (ZOFRAN) 8 MG tablet Take 1 tablet (8 mg total) by mouth every 4 (four) hours as needed. (Patient not taking: Reported on 08/04/2018) 10 tablet 0  . oxyCODONE-acetaminophen (PERCOCET/ROXICET) 5-325 MG tablet Take 1 tablet by mouth every 4 (four) hours as needed for severe pain.    Marland Kitchen oxyCODONE-acetaminophen (PERCOCET/ROXICET) 5-325 MG tablet Take 1 tablet by mouth every 4 (four) hours as needed for severe pain. (Patient not taking: Reported on 08/04/2018) 15 tablet 0  . pantoprazole (PROTONIX) 40 MG tablet Take 1 tablet (40 mg total) by mouth daily. (Patient not taking: Reported on 08/04/2018) 10  tablet 0   No current facility-administered medications on file prior to visit.     No Known Allergies  Social History   Substance and Sexual Activity  Alcohol Use Not Currently   Comment: RARE    Social History   Tobacco Use  Smoking Status Never Smoker  Smokeless Tobacco Never Used    Review of Systems  Constitutional: Negative.   HENT: Negative.   Eyes: Negative.   Respiratory: Negative.   Cardiovascular: Negative.   Gastrointestinal: Positive for abdominal pain.  Genitourinary: Negative.   Musculoskeletal: Positive for back  pain.  Skin: Negative.   Neurological: Negative.   Endo/Heme/Allergies: Negative.   Psychiatric/Behavioral: Negative.     Objective   Vitals:   08/04/18 0908  BP: (!) 149/92  Pulse: 64  Resp: 20  Temp: 97.7 F (36.5 C)    Physical Exam  Constitutional: He is oriented to person, place, and time. He appears well-developed and well-nourished. He does not appear ill.  HENT:  Head: Normocephalic and atraumatic.  Eyes: No scleral icterus.  Cardiovascular: Normal rate, regular rhythm and normal heart sounds. Exam reveals no gallop and no friction rub.  No murmur heard. Pulmonary/Chest: Effort normal and breath sounds normal. No stridor. No respiratory distress. He has no wheezes. He has no rhonchi. He has no rales.  Abdominal: Soft. Normal appearance and bowel sounds are normal. He exhibits no distension and no mass. There is no tenderness. There is negative Murphy's sign.  Neurological: He is alert and oriented to person, place, and time.  Skin: Skin is warm and dry.  Vitals reviewed. Dr. Fusco's notes reviewed.  Ultrasound report reviewed.  Assessment  Biliary colic, cholelithiasis, elevated liver enzyme tests Plan   Patient is scheduled for laparoscopic cholecystectomy with liver biopsy on 08/07/2018.  The risks and benefits of the procedure including bleeding, infection, hepatobiliary injury, and the possibility of an open procedure were fully explained to the patient, who gave informed consent.  

## 2018-08-06 ENCOUNTER — Encounter (HOSPITAL_COMMUNITY)
Admission: RE | Admit: 2018-08-06 | Discharge: 2018-08-06 | Disposition: A | Discharge status: Home / Self Care | Payer: BLUE CROSS/BLUE SHIELD | Source: Ambulatory Visit | Attending: General Surgery | Admitting: General Surgery

## 2018-08-06 ENCOUNTER — Encounter (HOSPITAL_COMMUNITY): Payer: Self-pay

## 2018-08-07 ENCOUNTER — Ambulatory Visit (HOSPITAL_COMMUNITY)
Admission: RE | Admit: 2018-08-07 | Discharge: 2018-08-07 | Disposition: A | Discharge status: Home / Self Care | Payer: BLUE CROSS/BLUE SHIELD | Source: Ambulatory Visit | Attending: General Surgery | Admitting: General Surgery

## 2018-08-07 ENCOUNTER — Encounter (HOSPITAL_COMMUNITY): Payer: Self-pay | Admitting: *Deleted

## 2018-08-07 ENCOUNTER — Ambulatory Visit (HOSPITAL_COMMUNITY): Payer: BLUE CROSS/BLUE SHIELD | Admitting: Anesthesiology

## 2018-08-07 ENCOUNTER — Encounter (HOSPITAL_COMMUNITY)
Admission: RE | Disposition: A | Discharge status: Home / Self Care | Payer: Self-pay | Source: Ambulatory Visit | Attending: General Surgery

## 2018-08-07 DIAGNOSIS — K8020 Calculus of gallbladder without cholecystitis without obstruction: Secondary | ICD-10-CM

## 2018-08-07 DIAGNOSIS — Z79899 Other long term (current) drug therapy: Secondary | ICD-10-CM | POA: Diagnosis not present

## 2018-08-07 DIAGNOSIS — K8064 Calculus of gallbladder and bile duct with chronic cholecystitis without obstruction: Secondary | ICD-10-CM | POA: Insufficient documentation

## 2018-08-07 DIAGNOSIS — E781 Pure hyperglyceridemia: Secondary | ICD-10-CM | POA: Insufficient documentation

## 2018-08-07 DIAGNOSIS — K8050 Calculus of bile duct without cholangitis or cholecystitis without obstruction: Secondary | ICD-10-CM | POA: Diagnosis not present

## 2018-08-07 DIAGNOSIS — R748 Abnormal levels of other serum enzymes: Secondary | ICD-10-CM

## 2018-08-07 DIAGNOSIS — G4733 Obstructive sleep apnea (adult) (pediatric): Secondary | ICD-10-CM | POA: Diagnosis not present

## 2018-08-07 DIAGNOSIS — K760 Fatty (change of) liver, not elsewhere classified: Secondary | ICD-10-CM | POA: Diagnosis not present

## 2018-08-07 DIAGNOSIS — Z9989 Dependence on other enabling machines and devices: Secondary | ICD-10-CM | POA: Diagnosis not present

## 2018-08-07 DIAGNOSIS — K8010 Calculus of gallbladder with chronic cholecystitis without obstruction: Secondary | ICD-10-CM | POA: Diagnosis not present

## 2018-08-07 HISTORY — PX: CHOLECYSTECTOMY: SHX55

## 2018-08-07 HISTORY — PX: LIVER BIOPSY: SHX301

## 2018-08-07 SURGERY — LAPAROSCOPIC CHOLECYSTECTOMY
Anesthesia: General

## 2018-08-07 MED ORDER — ONDANSETRON HCL 4 MG/2ML IJ SOLN: 4.0000 mg | Freq: Once | INTRAMUSCULAR | Status: DC | PRN

## 2018-08-07 MED ORDER — ONDANSETRON HCL 4 MG/2ML IJ SOLN
INTRAMUSCULAR | Status: DC | PRN
  Administered 2018-08-07: 4 mg via INTRAVENOUS

## 2018-08-07 MED ORDER — FENTANYL CITRATE (PF) 250 MCG/5ML IJ SOLN
INTRAMUSCULAR | Status: AC
  Filled 2018-08-07: qty 5

## 2018-08-07 MED ORDER — DEXAMETHASONE SODIUM PHOSPHATE 4 MG/ML IJ SOLN
INTRAMUSCULAR | Status: AC
  Filled 2018-08-07: qty 2

## 2018-08-07 MED ORDER — SODIUM CHLORIDE 0.9 % IR SOLN
Status: DC | PRN
  Administered 2018-08-07: 1000 mL

## 2018-08-07 MED ORDER — PROPOFOL 10 MG/ML IV BOLUS
INTRAVENOUS | Status: AC
  Filled 2018-08-07: qty 40

## 2018-08-07 MED ORDER — HYDROCODONE-ACETAMINOPHEN 7.5-325 MG PO TABS: 1.0000 | ORAL_TABLET | Freq: Once | ORAL | Status: DC | PRN

## 2018-08-07 MED ORDER — ONDANSETRON HCL 4 MG/2ML IJ SOLN
INTRAMUSCULAR | Status: AC
  Filled 2018-08-07: qty 2

## 2018-08-07 MED ORDER — PROPOFOL 10 MG/ML IV BOLUS
INTRAVENOUS | Status: DC | PRN
  Administered 2018-08-07: 40 mg via INTRAVENOUS
  Administered 2018-08-07: 160 mg via INTRAVENOUS

## 2018-08-07 MED ORDER — SUGAMMADEX SODIUM 500 MG/5ML IV SOLN
INTRAVENOUS | Status: DC | PRN
  Administered 2018-08-07: 300 mg via INTRAVENOUS

## 2018-08-07 MED ORDER — LIDOCAINE HCL (PF) 1 % IJ SOLN
INTRAMUSCULAR | Status: AC
  Filled 2018-08-07: qty 5

## 2018-08-07 MED ORDER — HYDROCODONE-ACETAMINOPHEN 5-325 MG PO TABS: 1.0000 | ORAL_TABLET | ORAL | 0 refills | Status: DC | PRN

## 2018-08-07 MED ORDER — KETOROLAC TROMETHAMINE 30 MG/ML IJ SOLN
30.0000 mg | Freq: Once | INTRAMUSCULAR | Status: AC
  Administered 2018-08-07: 30 mg via INTRAVENOUS

## 2018-08-07 MED ORDER — BUPIVACAINE LIPOSOME 1.3 % IJ SUSP
INTRAMUSCULAR | Status: AC
  Filled 2018-08-07: qty 20

## 2018-08-07 MED ORDER — CHLORHEXIDINE GLUCONATE CLOTH 2 % EX PADS: 6.0000 | MEDICATED_PAD | Freq: Once | CUTANEOUS | Status: DC

## 2018-08-07 MED ORDER — HEMOSTATIC AGENTS (NO CHARGE) OPTIME
TOPICAL | Status: DC | PRN
  Administered 2018-08-07 (×2): 1 via TOPICAL

## 2018-08-07 MED ORDER — FENTANYL CITRATE (PF) 100 MCG/2ML IJ SOLN
INTRAMUSCULAR | Status: DC | PRN
  Administered 2018-08-07: 50 ug via INTRAVENOUS
  Administered 2018-08-07 (×2): 25 ug via INTRAVENOUS
  Administered 2018-08-07: 100 ug via INTRAVENOUS
  Administered 2018-08-07: 50 ug via INTRAVENOUS

## 2018-08-07 MED ORDER — POVIDONE-IODINE 10 % EX OINT
TOPICAL_OINTMENT | CUTANEOUS | Status: AC
  Filled 2018-08-07: qty 1

## 2018-08-07 MED ORDER — HYDROMORPHONE HCL 1 MG/ML IJ SOLN: 0.2500 mg | INTRAMUSCULAR | Status: DC | PRN

## 2018-08-07 MED ORDER — ROCURONIUM BROMIDE 100 MG/10ML IV SOLN
INTRAVENOUS | Status: DC | PRN
  Administered 2018-08-07: 40 mg via INTRAVENOUS

## 2018-08-07 MED ORDER — MEPERIDINE HCL 50 MG/ML IJ SOLN: 6.2500 mg | INTRAMUSCULAR | Status: DC | PRN

## 2018-08-07 MED ORDER — LACTATED RINGERS IV SOLN
INTRAVENOUS | Status: DC
  Administered 2018-08-07: 09:00:00 via INTRAVENOUS
  Administered 2018-08-07: 1000 mL via INTRAVENOUS

## 2018-08-07 MED ORDER — KETOROLAC TROMETHAMINE 30 MG/ML IJ SOLN
30.0000 mg | Freq: Once | INTRAMUSCULAR | Status: DC | PRN
  Filled 2018-08-07: qty 1

## 2018-08-07 MED ORDER — CIPROFLOXACIN IN D5W 400 MG/200ML IV SOLN
400.0000 mg | INTRAVENOUS | Status: AC
  Administered 2018-08-07: 400 mg via INTRAVENOUS
  Filled 2018-08-07: qty 200

## 2018-08-07 MED ORDER — POVIDONE-IODINE 10 % OINT PACKET
TOPICAL_OINTMENT | CUTANEOUS | Status: DC | PRN
  Administered 2018-08-07: 1 via TOPICAL

## 2018-08-07 MED ORDER — BUPIVACAINE LIPOSOME 1.3 % IJ SUSP
INTRAMUSCULAR | Status: DC | PRN
  Administered 2018-08-07: 14 mL

## 2018-08-07 MED ORDER — DEXAMETHASONE SODIUM PHOSPHATE 4 MG/ML IJ SOLN
INTRAMUSCULAR | Status: DC | PRN
  Administered 2018-08-07: 8 mg via INTRAVENOUS

## 2018-08-07 SURGICAL SUPPLY — 52 items
APPLICATOR ARISTA FLEXITIP XL (MISCELLANEOUS) ×4 IMPLANT
APPLIER CLIP ROT 10 11.4 M/L (STAPLE) ×4
BAG RETRIEVAL 10 (BASKET) ×1
BAG RETRIEVAL 10MM (BASKET) ×1
CHLORAPREP W/TINT 26ML (MISCELLANEOUS) ×4 IMPLANT
CLIP APPLIE ROT 10 11.4 M/L (STAPLE) ×2 IMPLANT
CLOTH BEACON ORANGE TIMEOUT ST (SAFETY) ×4 IMPLANT
COVER LIGHT HANDLE STERIS (MISCELLANEOUS) ×8 IMPLANT
ELECT REM PT RETURN 9FT ADLT (ELECTROSURGICAL) ×4
ELECTRODE REM PT RTRN 9FT ADLT (ELECTROSURGICAL) ×2 IMPLANT
FILTER SMOKE EVAC LAPAROSHD (FILTER) ×4 IMPLANT
GLOVE BIO SURGEON STRL SZ7 (GLOVE) ×4 IMPLANT
GLOVE BIOGEL PI IND STRL 6.5 (GLOVE) ×2 IMPLANT
GLOVE BIOGEL PI IND STRL 7.0 (GLOVE) ×2 IMPLANT
GLOVE BIOGEL PI INDICATOR 6.5 (GLOVE) ×2
GLOVE BIOGEL PI INDICATOR 7.0 (GLOVE) ×2
GLOVE SURG SS PI 6.5 STRL IVOR (GLOVE) ×4 IMPLANT
GLOVE SURG SS PI 7.5 STRL IVOR (GLOVE) ×4 IMPLANT
GOWN STRL REUS W/ TWL XL LVL3 (GOWN DISPOSABLE) ×2 IMPLANT
GOWN STRL REUS W/TWL LRG LVL3 (GOWN DISPOSABLE) ×8 IMPLANT
GOWN STRL REUS W/TWL XL LVL3 (GOWN DISPOSABLE) ×2
HEMOSTAT ARISTA ABSORB 3G PWDR (MISCELLANEOUS) ×4 IMPLANT
HEMOSTAT SNOW SURGICEL 2X4 (HEMOSTASIS) ×4 IMPLANT
INST SET LAPROSCOPIC AP (KITS) ×4 IMPLANT
IV NS IRRIG 3000ML ARTHROMATIC (IV SOLUTION) IMPLANT
KIT TURNOVER KIT A (KITS) ×4 IMPLANT
MANIFOLD NEPTUNE II (INSTRUMENTS) ×4 IMPLANT
NEEDLE BIOPSY 14X6 SOFT TISS (NEEDLE) ×4 IMPLANT
NEEDLE HYPO 18GX1.5 BLUNT FILL (NEEDLE) ×4 IMPLANT
NEEDLE HYPO 22GX1.5 SAFETY (NEEDLE) ×4 IMPLANT
NEEDLE INSUFFLATION 14GA 120MM (NEEDLE) ×4 IMPLANT
NS IRRIG 1000ML POUR BTL (IV SOLUTION) ×4 IMPLANT
PACK LAP CHOLE LZT030E (CUSTOM PROCEDURE TRAY) ×4 IMPLANT
PAD ARMBOARD 7.5X6 YLW CONV (MISCELLANEOUS) ×4 IMPLANT
SET BASIN LINEN APH (SET/KITS/TRAYS/PACK) ×4 IMPLANT
SET TUBE IRRIG SUCTION NO TIP (IRRIGATION / IRRIGATOR) IMPLANT
SLEEVE ENDOPATH XCEL 5M (ENDOMECHANICALS) ×4 IMPLANT
SPONGE GAUZE 2X2 8PLY STER LF (GAUZE/BANDAGES/DRESSINGS) ×4
SPONGE GAUZE 2X2 8PLY STRL LF (GAUZE/BANDAGES/DRESSINGS) ×12 IMPLANT
STAPLER VISISTAT (STAPLE) ×4 IMPLANT
SUT VICRYL 0 UR6 27IN ABS (SUTURE) ×4 IMPLANT
SYR 20CC LL (SYRINGE) ×4 IMPLANT
SYS BAG RETRIEVAL 10MM (BASKET) ×2
SYSTEM BAG RETRIEVAL 10MM (BASKET) ×2 IMPLANT
TAPE CLOTH SURG 4X10 WHT LF (GAUZE/BANDAGES/DRESSINGS) ×4 IMPLANT
TROCAR ENDO BLADELESS 11MM (ENDOMECHANICALS) ×4 IMPLANT
TROCAR XCEL NON-BLD 5MMX100MML (ENDOMECHANICALS) ×4 IMPLANT
TROCAR XCEL UNIV SLVE 11M 100M (ENDOMECHANICALS) ×4 IMPLANT
TUBE CONNECTING 12'X1/4 (SUCTIONS) ×1
TUBE CONNECTING 12X1/4 (SUCTIONS) ×3 IMPLANT
TUBING INSUFFLATION (TUBING) ×4 IMPLANT
WARMER LAPAROSCOPE (MISCELLANEOUS) ×4 IMPLANT

## 2018-08-07 NOTE — Anesthesia Preprocedure Evaluation (Signed)
Anesthesia Evaluation  Patient identified by MRN, date of birth, ID band Patient awake    Reviewed: Allergy & Precautions, H&P , NPO status , Patient's Chart, lab work & pertinent test results, reviewed documented beta blocker date and time   Airway Mallampati: II  TM Distance: >3 FB Neck ROM: full    Dental no notable dental hx.    Pulmonary neg pulmonary ROS, shortness of breath, sleep apnea and Continuous Positive Airway Pressure Ventilation ,    Pulmonary exam normal breath sounds clear to auscultation       Cardiovascular Exercise Tolerance: Good negative cardio ROS   Rhythm:regular Rate:Normal     Neuro/Psych negative neurological ROS  negative psych ROS   GI/Hepatic negative GI ROS, Neg liver ROS,   Endo/Other  negative endocrine ROS  Renal/GU Renal diseasenegative Renal ROS  negative genitourinary   Musculoskeletal   Abdominal   Peds  Hematology negative hematology ROS (+)   Anesthesia Other Findings   Reproductive/Obstetrics negative OB ROS                             Anesthesia Physical Anesthesia Plan  ASA: II  Anesthesia Plan: General   Post-op Pain Management:    Induction:   PONV Risk Score and Plan:   Airway Management Planned:   Additional Equipment:   Intra-op Plan:   Post-operative Plan:   Informed Consent: I have reviewed the patients History and Physical, chart, labs and discussed the procedure including the risks, benefits and alternatives for the proposed anesthesia with the patient or authorized representative who has indicated his/her understanding and acceptance.   Dental Advisory Given  Plan Discussed with: CRNA  Anesthesia Plan Comments:         Anesthesia Quick Evaluation

## 2018-08-07 NOTE — Op Note (Signed)
Patient:  GENNIE DIB  DOB:  1956/08/02  MRN:  [id number]   Preop Diagnosis: Biliary colic, cholelithiasis, elevated liver tests  Postop Diagnosis: Same  Procedure: Laparoscopic cholecystectomy, liver biopsy  Surgeon: Aviva Signs, MD  Anes: General endotracheal  Indications: Patient is a 63 year old white male who presents with biliary colic secondary to cholelithiasis.  He is also noted to have elevated liver enzyme tests and steatosis.  The risks and benefits of the procedures including bleeding, infection, hepatobiliary injury, and the possibility of an open procedure were fully explained to the patient, who gave informed consent.  Procedure note: The patient was placed in supine position.  After induction of general endotracheal anesthesia, the abdomen was prepped and draped using the usual sterile technique with DuraPrep.  Surgical site confirmation was performed.  A supraumbilical incision was made down to the fascia.  A Veress needle was introduced into the abdominal cavity and confirmation of placement was done using the saline drop test.  The abdomen was then insufflated to 15 mmHg pressure.  An 11 mm trocar was introduced into the abdominal cavity under direct visualization without difficulty.  An 11 mm trocar was introduced into the abdominal cavity under direct visualization without difficulty.  The patient was placed in reverse Trendelenburg position and an additional 11 mm trocar was placed in the epigastric region and 5 mm trochars were placed the right upper quadrant and right flank regions.  Liver was inspected and appeared to be within normal limits.  A Tru-Cut needle biopsy of the right lobe of the liver was performed.  This was sent to pathology for further examination.  No bleeding was noted at the end of the procedure.  The gallbladder was then retracted in a dynamic fashion in order to provide a critical view of the triangle of Calot.  The cystic duct was first  identified.  Its juncture to the infundibulum was fully identified.  Endoclips were placed proximally and distally on the cystic duct, and the cystic duct was divided.  This was likewise done to the cystic artery.  The gallbladder was freed away from the gallbladder fossa using Bovie electrocautery.  The gallbladder was delivered through the epigastric trocar site using an Endo Catch bag.  The gallbladder fossa was inspected and no abnormal bleeding or bile leakage was noted.  Arista and Surgicel were placed in the gallbladder fossa.  All fluid and air were then evacuated from the abdominal cavity prior to the removal of the trochars.  All wounds were irrigated with normal saline.  All wounds were injected with 0.5% Sensorcaine.  The supraumbilical fascia as well as epigastric fascia were reapproximated using 0 Vicryl interrupted sutures.  All skin incisions were closed using staples.  Betadine ointment and dry sterile dressings were applied.  All tape and needle counts were correct at the end of the procedure.  The patient was extubated in the operating room and transferred to PACU in stable condition.  Complications: None  EBL: Minimal  Specimen: Gallbladder, liver biopsy

## 2018-08-07 NOTE — Discharge Instructions (Signed)
Laparoscopic Cholecystectomy, Care After °This sheet gives you information about how to care for yourself after your procedure. Your health care provider may also give you more specific instructions. If you have problems or questions, contact your health care provider. °What can I expect after the procedure? °After the procedure, it is common to have: °· Pain at your incision sites. You will be given medicines to control this pain. °· Mild nausea or vomiting. °· Bloating and possible shoulder pain from the air-like gas that was used during the procedure. ° °Follow these instructions at home: °Incision care ° °· Follow instructions from your health care provider about how to take care of your incisions. Make sure you: °? Wash your hands with soap and water before you change your bandage (dressing). If soap and water are not available, use hand sanitizer. °? Change your dressing as told by your health care provider. °? Leave stitches (sutures), skin glue, or adhesive strips in place. These skin closures may need to be in place for 2 weeks or longer. If adhesive strip edges start to loosen and curl up, you may trim the loose edges. Do not remove adhesive strips completely unless your health care provider tells you to do that. °· Do not take baths, swim, or use a hot tub until your health care provider approves. Ask your health care provider if you can take showers. You may only be allowed to take sponge baths for bathing. °· Check your incision area every day for signs of infection. Check for: °? More redness, swelling, or pain. °? More fluid or blood. °? Warmth. °? Pus or a bad smell. °Activity °· Do not drive or use heavy machinery while taking prescription pain medicine. °· Do not lift anything that is heavier than 10 lb (4.5 kg) until your health care provider approves. °· Do not play contact sports until your health care provider approves. °· Do not drive for 24 hours if you were given a medicine to help you relax  (sedative). °· Rest as needed. Do not return to work or school until your health care provider approves. °General instructions °· Take over-the-counter and prescription medicines only as told by your health care provider. °· To prevent or treat constipation while you are taking prescription pain medicine, your health care provider may recommend that you: °? Drink enough fluid to keep your urine clear or pale yellow. °? Take over-the-counter or prescription medicines. °? Eat foods that are high in fiber, such as fresh fruits and vegetables, whole grains, and beans. °? Limit foods that are high in fat and processed sugars, such as fried and sweet foods. °Contact a health care provider if: °· You develop a rash. °· You have more redness, swelling, or pain around your incisions. °· You have more fluid or blood coming from your incisions. °· Your incisions feel warm to the touch. °· You have pus or a bad smell coming from your incisions. °· You have a fever. °· One or more of your incisions breaks open. °Get help right away if: °· You have trouble breathing. °· You have chest pain. °· You have increasing pain in your shoulders. °· You faint or feel dizzy when you stand. °· You have severe pain in your abdomen. °· You have nausea or vomiting that lasts for more than one day. °· You have leg pain. °This information is not intended to replace advice given to you by your health care provider. Make sure you discuss any questions you   have with your health care provider. Document Released: 11/04/2005 Document Revised: 05/25/2016 Document Reviewed: 04/22/2016 Elsevier Interactive Patient Education  2018 [facility] Anesthesia, Adult, Care After These instructions provide you with information about caring for yourself after your procedure. Your health care provider may also give you more specific instructions. Your treatment has been planned according to current medical practices, but problems sometimes occur.  Call your health care provider if you have any problems or questions after your procedure. What can I expect after the procedure? After the procedure, it is common to have:  Vomiting.  A sore throat.  Mental slowness.  It is common to feel:  Nauseous.  Cold or shivery.  Sleepy.  Tired.  Sore or achy, even in parts of your body where you did not have surgery.  Follow these instructions at home: For at least 24 hours after the procedure:  Do not: ? Participate in activities where you could fall or become injured. ? Drive. ? Use heavy machinery. ? Drink alcohol. ? Take sleeping pills or medicines that cause drowsiness. ? Make important decisions or sign legal documents. ? Take care of children on your own.  Rest. Eating and drinking  If you vomit, drink water, juice, or soup when you can drink without vomiting.  Drink enough fluid to keep your urine clear or pale yellow.  Make sure you have little or no nausea before eating solid foods.  Follow the diet recommended by your health care provider. General instructions  Have a responsible adult stay with you until you are awake and alert.  Return to your normal activities as told by your health care provider. Ask your health care provider what activities are safe for you.  Take over-the-counter and prescription medicines only as told by your health care provider.  If you smoke, do not smoke without supervision.  Keep all follow-up visits as told by your health care provider. This is important. Contact a health care provider if:  You continue to have nausea or vomiting at home, and medicines are not helpful.  You cannot drink fluids or start eating again.  You cannot urinate after 8-12 hours.  You develop a skin rash.  You have fever.  You have increasing redness at the site of your procedure. Get help right away if:  You have difficulty breathing.  You have chest pain.  You have unexpected  bleeding.  You feel that you are having a life-threatening or urgent problem. This information is not intended to replace advice given to you by your health care provider. Make sure you discuss any questions you have with your health care provider. Document Released: 02/10/2001 Document Revised: 04/08/2016 Document Reviewed: 10/19/2015 Elsevier Interactive Patient Education  [employer].

## 2018-08-07 NOTE — Anesthesia Procedure Notes (Signed)
Procedure Name: Intubation Date/Time: 08/07/2018 7:40 AM Performed by: Vista Deck, CRNA Pre-anesthesia Checklist: Patient identified, Emergency Drugs available, Suction available, Patient being monitored and Timeout performed Patient Re-evaluated:Patient Re-evaluated prior to induction Oxygen Delivery Method: Circle system utilized Preoxygenation: Pre-oxygenation with 100% oxygen Induction Type: IV induction and Cricoid Pressure applied Laryngoscope Size: Glidescope and 3 Grade View: Grade I Tube type: Oral Tube size: 7.0 mm Number of attempts: 2 Airway Equipment and Method: Video-laryngoscopy,  Stylet and Oral airway Placement Confirmation: ETT inserted through vocal cords under direct vision,  positive ETCO2 and breath sounds checked- equal and bilateral Secured at: 23 cm Tube secured with: Tape Dental Injury: Teeth and Oropharynx as per pre-operative assessment  Comments: Poor visualization with Mac 4. Easy mask ventilation. Good view with Glide scope

## 2018-08-07 NOTE — Interval H&P Note (Signed)
History and Physical Interval Note:  08/07/2018 7:17 AM  [name]  has presented today for surgery, with the diagnosis of cholelithiasis  The various methods of treatment have been discussed with the patient and family. After consideration of risks, benefits and other options for treatment, the patient has consented to  Procedure(s): LAPAROSCOPIC CHOLECYSTECTOMY (N/A) as a surgical intervention .  The patient's history has been reviewed, patient examined, no change in status, stable for surgery.  I have reviewed the patient's chart and labs.  Questions were answered to the patient's satisfaction.     Aviva Signs

## 2018-08-07 NOTE — Anesthesia Postprocedure Evaluation (Signed)
Anesthesia Post Note  Patient: CLABORN JANUSZ  Procedure(s) Performed: LAPAROSCOPIC CHOLECYSTECTOMY (N/A ) LIVER BIOPSY  Patient location during evaluation: Short Stay Anesthesia Type: General Level of consciousness: awake and alert and patient cooperative Pain management: satisfactory to patient Vital Signs Assessment: post-procedure vital signs reviewed and stable Respiratory status: spontaneous breathing Cardiovascular status: stable Postop Assessment: no apparent nausea or vomiting Anesthetic complications: no     Last Vitals:  Vitals:   08/07/18 0835 08/07/18 0928  BP: (!) 146/105 104/66  Pulse:  (!) 59  Resp: 14 16  Temp: 36.6 C 36.8 C  SpO2: 99% 92%    Last Pain:  Vitals:   08/07/18 0928  TempSrc: Oral  PainSc: 3                  Hazelee Harbold

## 2018-08-07 NOTE — Transfer of Care (Signed)
Immediate Anesthesia Transfer of Care Note  Patient: [name]  Procedure(s) Performed: LAPAROSCOPIC CHOLECYSTECTOMY (N/A ) LIVER BIOPSY  Patient Location: PACU  Anesthesia Type:General  Level of Consciousness: sedated and patient cooperative  Airway & Oxygen Therapy: Patient Spontanous Breathing and Patient connected to nasal cannula oxygen  Post-op Assessment: Report given to RN and Post -op Vital signs reviewed and stable  Post vital signs: Reviewed and stable  Last Vitals:  Vitals Value Taken Time  BP 146/105 08/07/2018  8:35 AM  Temp    Pulse 67 08/07/2018  8:35 AM  Resp 16 08/07/2018  8:35 AM  SpO2 95 % 08/07/2018  8:35 AM  Vitals shown include unvalidated device data.  Last Pain:  Vitals:   08/07/18 0704  TempSrc: Oral      Patients Stated Pain Goal: 8 (56/78/89 3388)  Complications: No apparent anesthesia complications

## 2018-08-10 ENCOUNTER — Encounter (HOSPITAL_COMMUNITY): Payer: Self-pay | Admitting: General Surgery

## 2018-08-18 ENCOUNTER — Encounter: Payer: Self-pay | Admitting: General Surgery

## 2018-08-18 ENCOUNTER — Ambulatory Visit (INDEPENDENT_AMBULATORY_CARE_PROVIDER_SITE_OTHER): Payer: Self-pay | Admitting: General Surgery

## 2018-08-18 VITALS — BP 149/86 | HR 61 | Temp 98.2°F | Resp 20 | Wt 248.0 lb

## 2018-08-18 DIAGNOSIS — Z09 Encounter for follow-up examination after completed treatment for conditions other than malignant neoplasm: Secondary | ICD-10-CM

## 2018-08-18 NOTE — Progress Notes (Signed)
Subjective:     [name]  Status post laparoscopic cholecystectomy.  Doing well.  Did have some right shoulder pain but that has since resolved.  No nausea or vomiting noted. Objective:    BP (!) 149/86 (BP Location: Left Arm, Patient Position: Sitting, Cuff Size: Large)   Pulse 61   Temp 98.2 F (36.8 C) (Temporal)   Resp 20   Wt 248 lb (112.5 kg)   BMI 31.84 kg/m   General:  alert, cooperative and no distress  Abdomen soft, incisions healing well.  Staples removed, Steri-Strips applied. Final pathology consistent with diagnosis.     Assessment:    Doing well postoperatively.    Plan:   May resume normal activity.  Follow-up here as needed

## 2018-08-24 DIAGNOSIS — G4733 Obstructive sleep apnea (adult) (pediatric): Secondary | ICD-10-CM | POA: Diagnosis not present

## 2018-09-24 DIAGNOSIS — G4733 Obstructive sleep apnea (adult) (pediatric): Secondary | ICD-10-CM | POA: Diagnosis not present

## 2018-09-30 DIAGNOSIS — E669 Obesity, unspecified: Secondary | ICD-10-CM | POA: Diagnosis not present

## 2018-09-30 DIAGNOSIS — G5702 Lesion of sciatic nerve, left lower limb: Secondary | ICD-10-CM | POA: Diagnosis not present

## 2018-09-30 DIAGNOSIS — Z1389 Encounter for screening for other disorder: Secondary | ICD-10-CM | POA: Diagnosis not present

## 2018-09-30 DIAGNOSIS — Z6831 Body mass index (BMI) 31.0-31.9, adult: Secondary | ICD-10-CM | POA: Diagnosis not present

## 2018-10-06 DIAGNOSIS — G4733 Obstructive sleep apnea (adult) (pediatric): Secondary | ICD-10-CM | POA: Diagnosis not present

## 2018-10-24 DIAGNOSIS — G4733 Obstructive sleep apnea (adult) (pediatric): Secondary | ICD-10-CM | POA: Diagnosis not present

## 2018-11-05 DIAGNOSIS — Z1389 Encounter for screening for other disorder: Secondary | ICD-10-CM | POA: Diagnosis not present

## 2018-11-05 DIAGNOSIS — H6991 Unspecified Eustachian tube disorder, right ear: Secondary | ICD-10-CM | POA: Diagnosis not present

## 2018-11-05 DIAGNOSIS — Z681 Body mass index (BMI) 19 or less, adult: Secondary | ICD-10-CM | POA: Diagnosis not present

## 2018-11-07 IMAGING — DX DG ABDOMEN 2V
4 series · 4 of 4 positions shown · non-contrast
Comparison: 11/27/2010

CLINICAL DATA: Upper abdominal pain beginning last night.

EXAM:
ABDOMEN - 2 VIEW

[abdomen erect (1 of 2)]
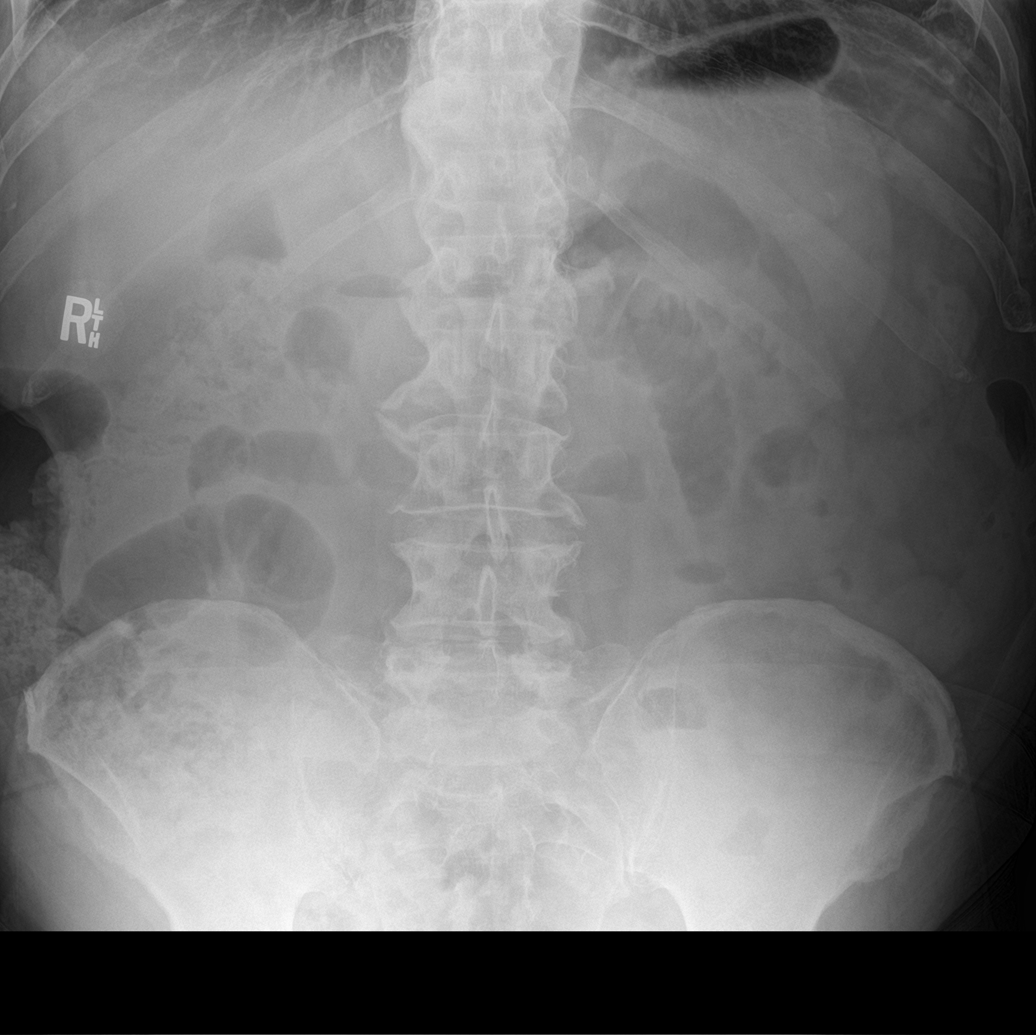

[abdomen supine (1 of 2)]
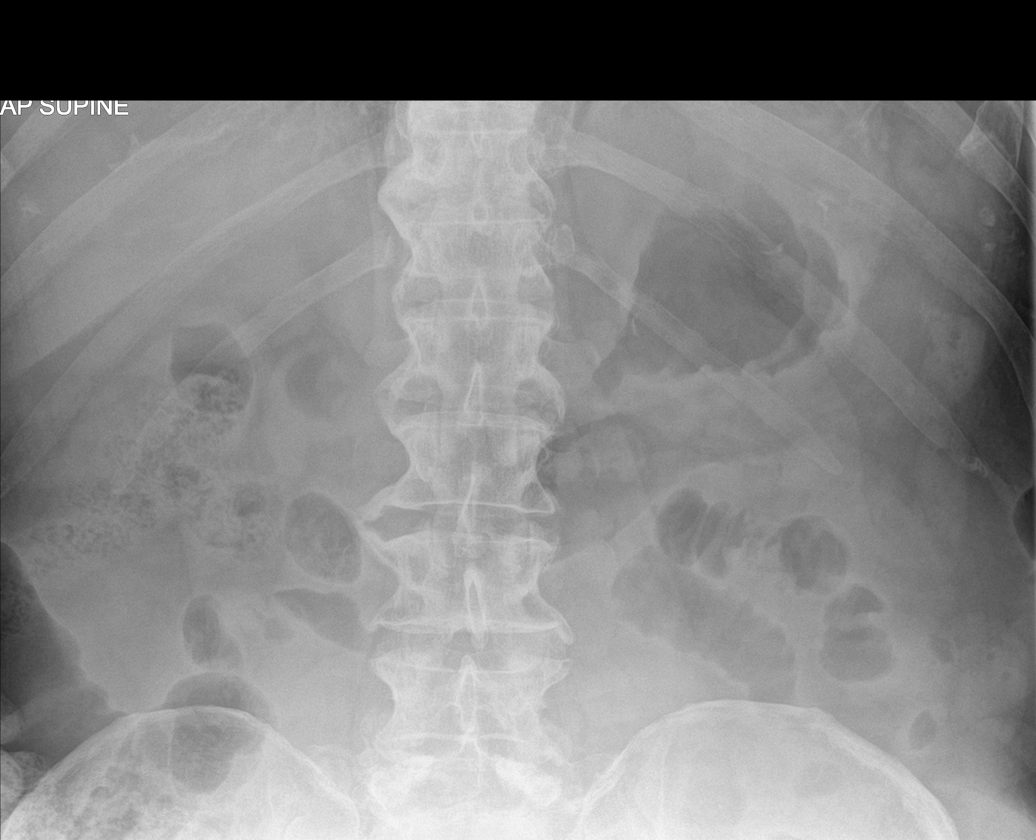

[abdomen supine (2 of 2)]
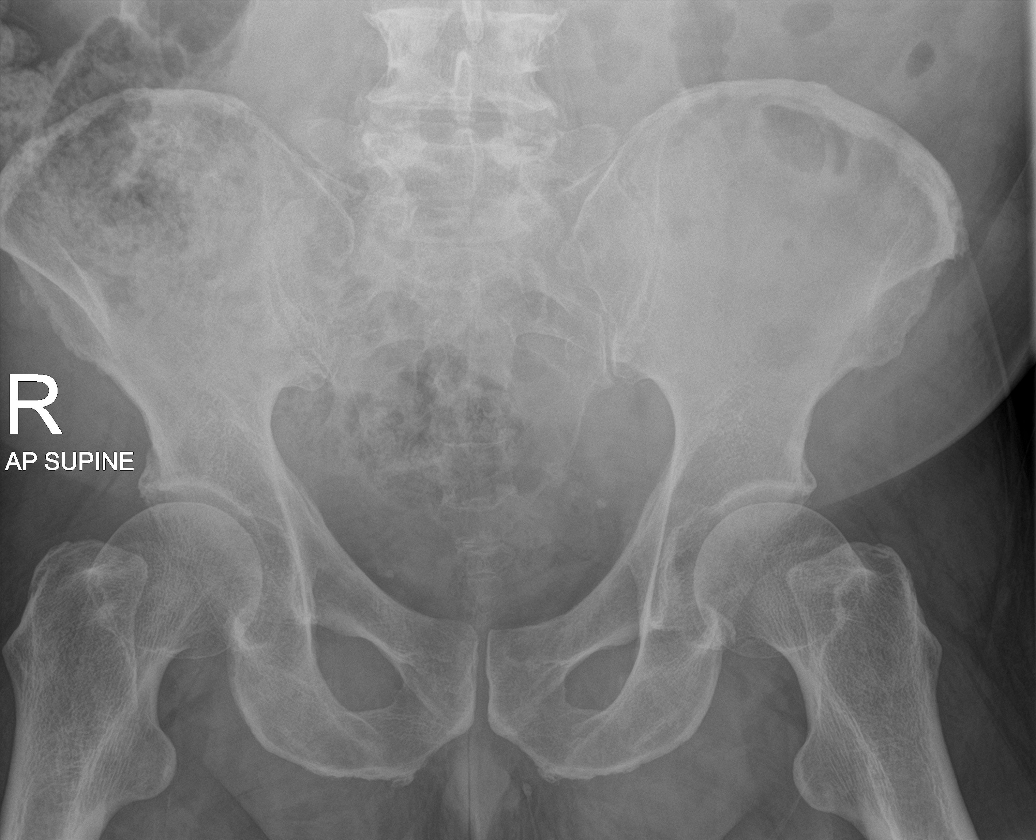

[abdomen erect (2 of 2)]
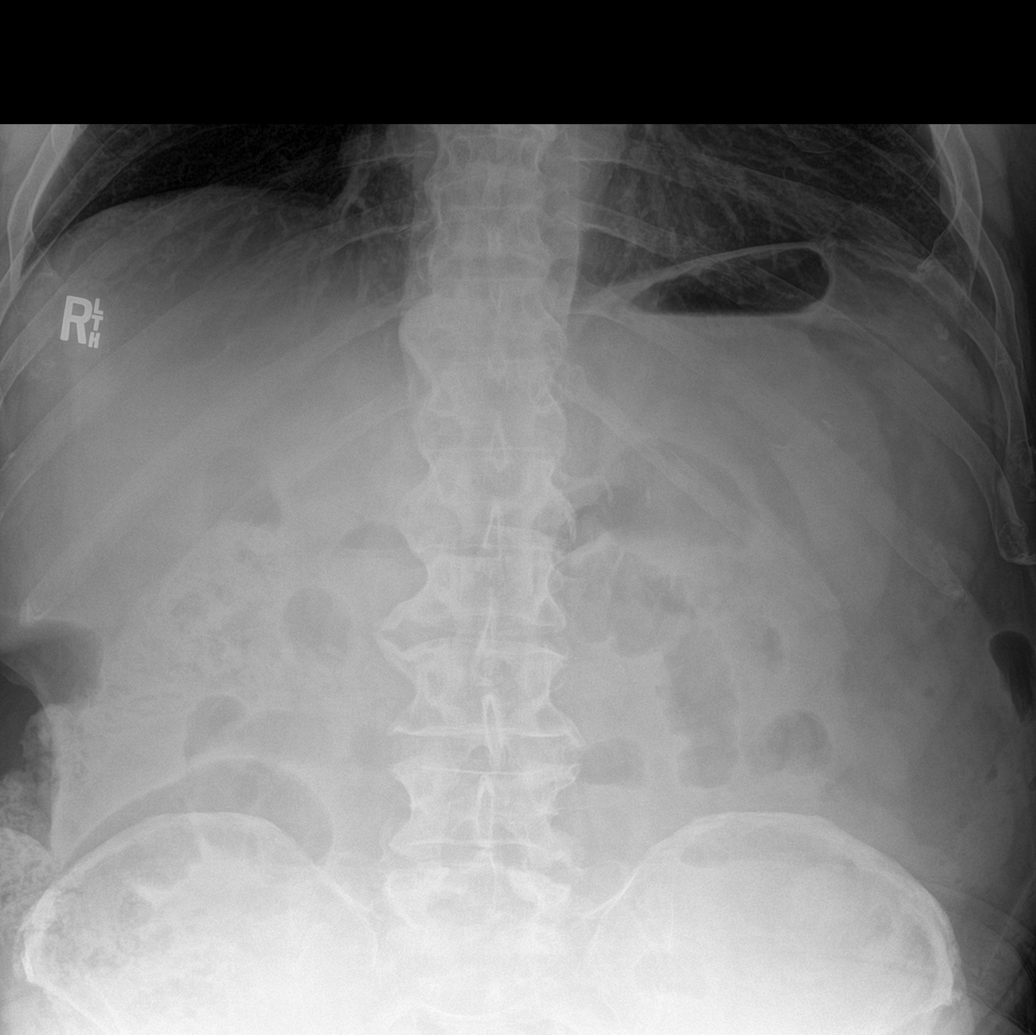

[4 of 4 positions shown; findings below may reference images not displayed]

FINDINGS: Moderate amount of fecal matter but within normal limits. Few small
bowel air-fluid levels but without dilatation. This can be normal or
can be seen with enteritis or mild reactive ileus. No free air. No
abnormal calcifications or significant bone findings.
IMPRESSION: Examination is probably within normal limits. Moderate amount of
fecal matter but within the range of normal. Few small bowel
air-fluid levels without dilatation of debatable significance. See
above

## 2018-11-24 DIAGNOSIS — G4733 Obstructive sleep apnea (adult) (pediatric): Secondary | ICD-10-CM | POA: Diagnosis not present

## 2018-12-07 ENCOUNTER — Ambulatory Visit (INDEPENDENT_AMBULATORY_CARE_PROVIDER_SITE_OTHER): Payer: BLUE CROSS/BLUE SHIELD | Admitting: Otolaryngology

## 2018-12-07 DIAGNOSIS — H9201 Otalgia, right ear: Secondary | ICD-10-CM | POA: Diagnosis not present

## 2018-12-07 DIAGNOSIS — H9041 Sensorineural hearing loss, unilateral, right ear, with unrestricted hearing on the contralateral side: Secondary | ICD-10-CM | POA: Diagnosis not present

## 2018-12-07 DIAGNOSIS — H9311 Tinnitus, right ear: Secondary | ICD-10-CM | POA: Diagnosis not present

## 2018-12-07 DIAGNOSIS — H6121 Impacted cerumen, right ear: Secondary | ICD-10-CM | POA: Diagnosis not present

## 2018-12-25 DIAGNOSIS — G4733 Obstructive sleep apnea (adult) (pediatric): Secondary | ICD-10-CM | POA: Diagnosis not present

## 2019-01-23 DIAGNOSIS — G4733 Obstructive sleep apnea (adult) (pediatric): Secondary | ICD-10-CM | POA: Diagnosis not present

## 2019-02-18 DIAGNOSIS — G4700 Insomnia, unspecified: Secondary | ICD-10-CM | POA: Diagnosis not present

## 2019-02-23 DIAGNOSIS — G4733 Obstructive sleep apnea (adult) (pediatric): Secondary | ICD-10-CM | POA: Diagnosis not present

## 2019-03-25 DIAGNOSIS — G4733 Obstructive sleep apnea (adult) (pediatric): Secondary | ICD-10-CM | POA: Diagnosis not present

## 2019-04-25 DIAGNOSIS — G4733 Obstructive sleep apnea (adult) (pediatric): Secondary | ICD-10-CM | POA: Diagnosis not present

## 2019-05-18 DIAGNOSIS — E6609 Other obesity due to excess calories: Secondary | ICD-10-CM | POA: Diagnosis not present

## 2019-05-18 DIAGNOSIS — H00033 Abscess of eyelid right eye, unspecified eyelid: Secondary | ICD-10-CM | POA: Diagnosis not present

## 2019-05-18 DIAGNOSIS — Z1389 Encounter for screening for other disorder: Secondary | ICD-10-CM | POA: Diagnosis not present

## 2019-05-18 DIAGNOSIS — Z6833 Body mass index (BMI) 33.0-33.9, adult: Secondary | ICD-10-CM | POA: Diagnosis not present

## 2019-05-18 DIAGNOSIS — B353 Tinea pedis: Secondary | ICD-10-CM | POA: Diagnosis not present

## 2019-05-25 DIAGNOSIS — G4733 Obstructive sleep apnea (adult) (pediatric): Secondary | ICD-10-CM | POA: Diagnosis not present

## 2019-05-28 ENCOUNTER — Other Ambulatory Visit: Payer: Self-pay

## 2019-05-28 DIAGNOSIS — R6889 Other general symptoms and signs: Secondary | ICD-10-CM | POA: Diagnosis not present

## 2019-05-28 DIAGNOSIS — Z20822 Contact with and (suspected) exposure to covid-19: Secondary | ICD-10-CM

## 2019-06-03 LAB — NOVEL CORONAVIRUS, NAA: SARS-CoV-2, NAA: NOT DETECTED

## 2019-06-11 DIAGNOSIS — M25561 Pain in right knee: Secondary | ICD-10-CM | POA: Diagnosis not present

## 2019-06-14 ENCOUNTER — Ambulatory Visit (INDEPENDENT_AMBULATORY_CARE_PROVIDER_SITE_OTHER): Payer: BC Managed Care – PPO | Admitting: Otolaryngology

## 2019-06-14 ENCOUNTER — Other Ambulatory Visit: Payer: Self-pay

## 2019-06-14 DIAGNOSIS — H6123 Impacted cerumen, bilateral: Secondary | ICD-10-CM

## 2019-06-14 DIAGNOSIS — H9041 Sensorineural hearing loss, unilateral, right ear, with unrestricted hearing on the contralateral side: Secondary | ICD-10-CM | POA: Diagnosis not present

## 2019-12-16 DIAGNOSIS — E6609 Other obesity due to excess calories: Secondary | ICD-10-CM | POA: Diagnosis not present

## 2019-12-16 DIAGNOSIS — Z1389 Encounter for screening for other disorder: Secondary | ICD-10-CM | POA: Diagnosis not present

## 2019-12-16 DIAGNOSIS — H9201 Otalgia, right ear: Secondary | ICD-10-CM | POA: Diagnosis not present

## 2019-12-16 DIAGNOSIS — Z6833 Body mass index (BMI) 33.0-33.9, adult: Secondary | ICD-10-CM | POA: Diagnosis not present

## 2020-02-07 DIAGNOSIS — M25562 Pain in left knee: Secondary | ICD-10-CM | POA: Diagnosis not present

## 2020-02-07 DIAGNOSIS — M1711 Unilateral primary osteoarthritis, right knee: Secondary | ICD-10-CM | POA: Diagnosis not present

## 2020-02-07 IMAGING — US US ABDOMEN COMPLETE
1 series · 13 of 25 positions shown · non-contrast
Comparison: CT abdomen pelvis of 11/30/2010

CLINICAL DATA: Abnormal liver function tests, history of kidney
stones and renal disease

EXAM:
ABDOMEN ULTRASOUND COMPLETE

[Series 1: us abdomen complete · 0.22mm/px · 13 of 117 slices shown]
[im 1/117]
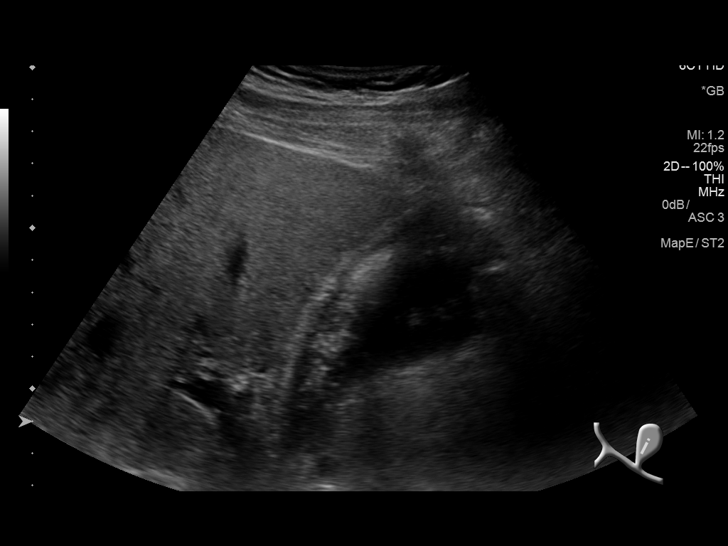
[im 10/117]
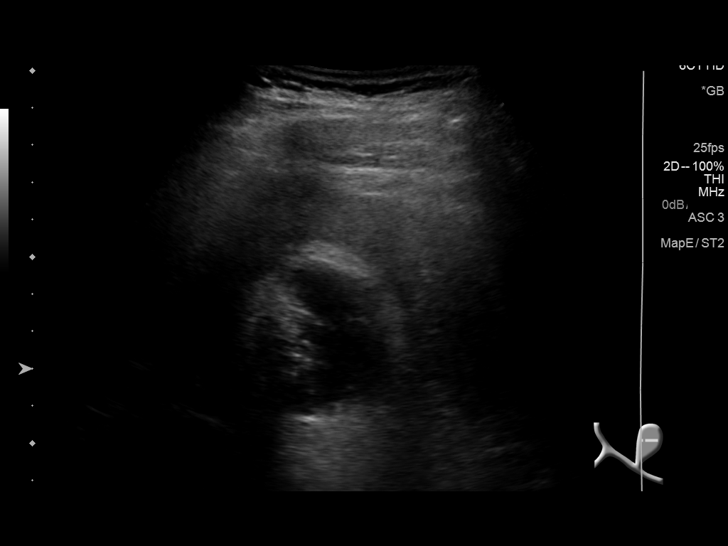
[im 20/117]
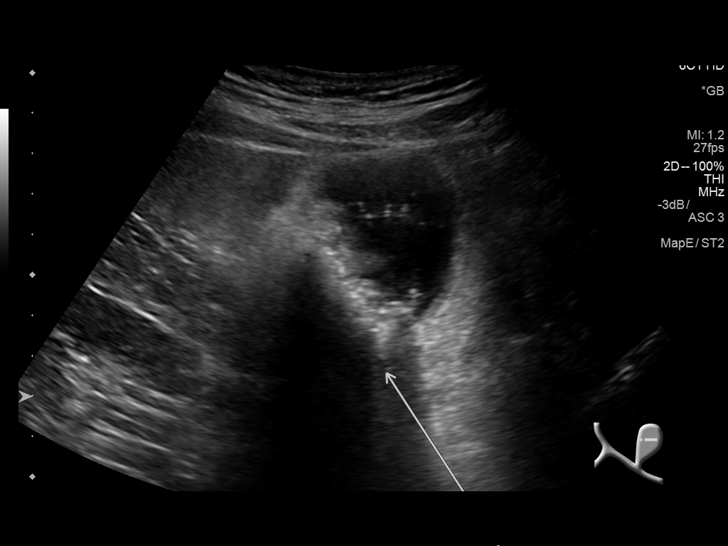
[im 30/117]
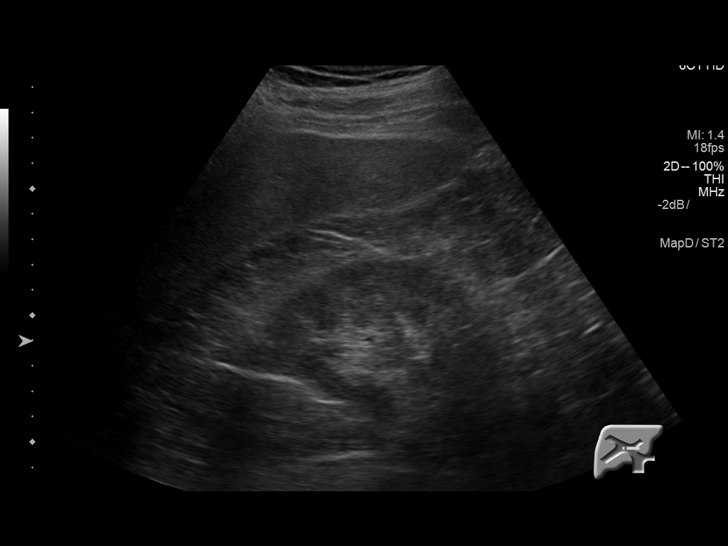
[im 39/117]
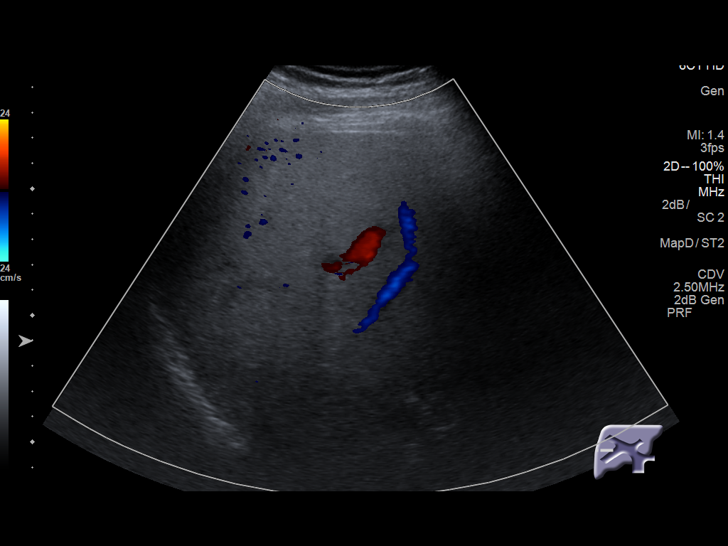
[im 49/117]
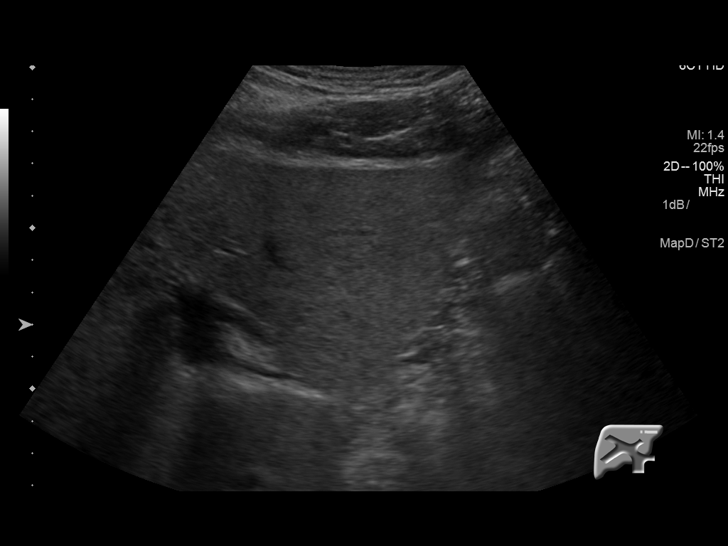
[im 59/117]
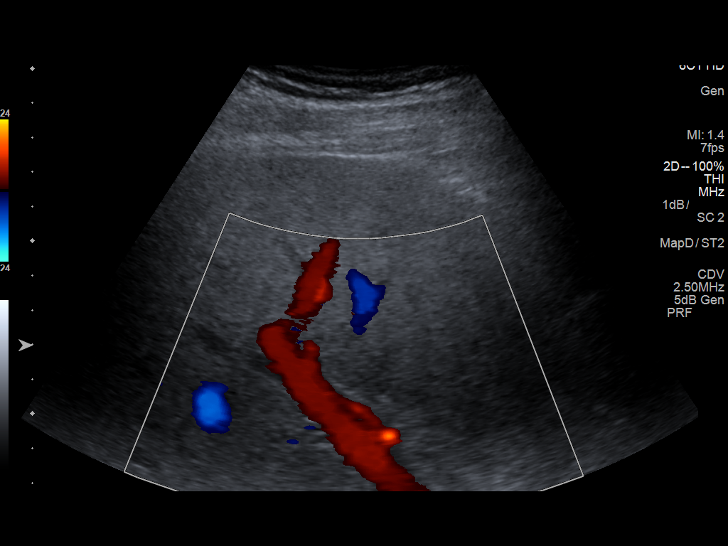
[im 68/117]
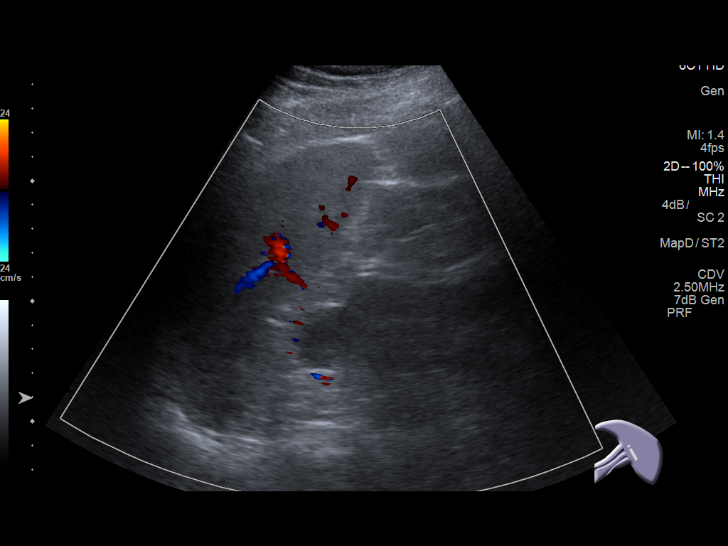
[im 78/117]
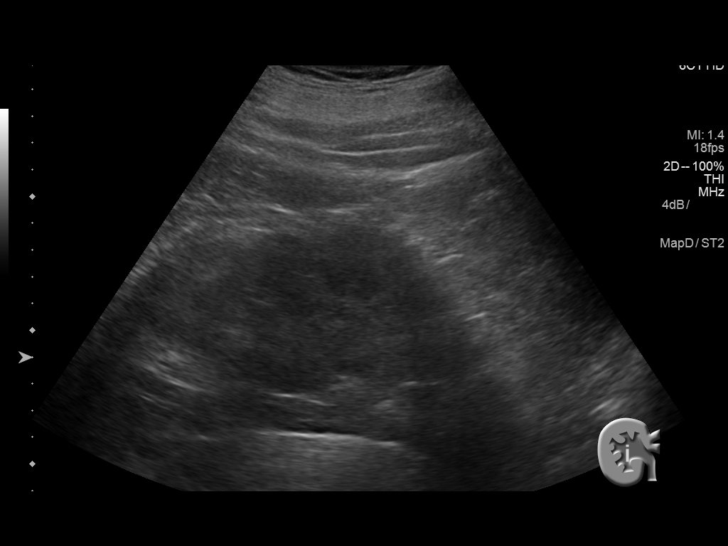
[im 88/117]
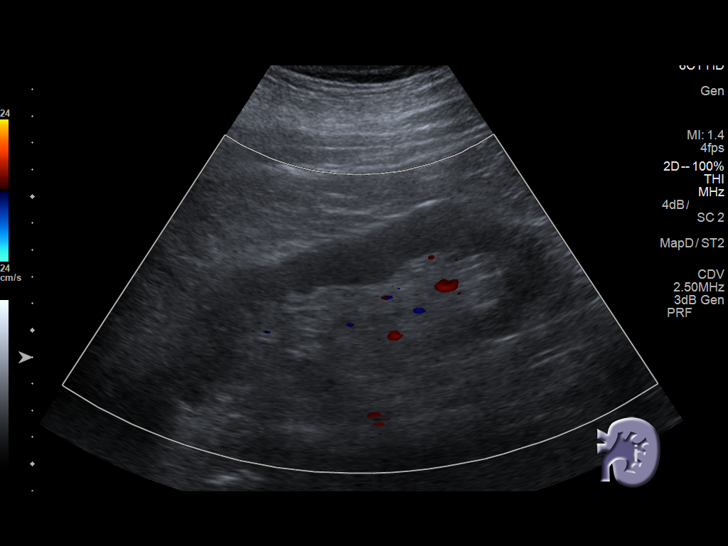
[im 97/117]
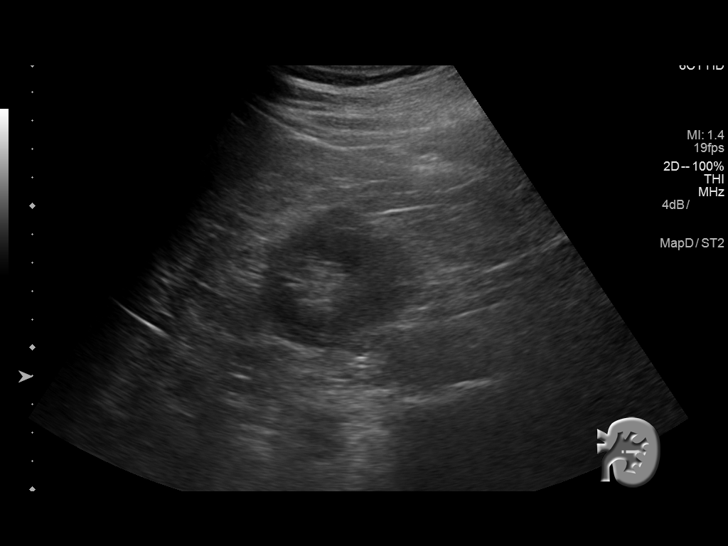
[im 107/117]
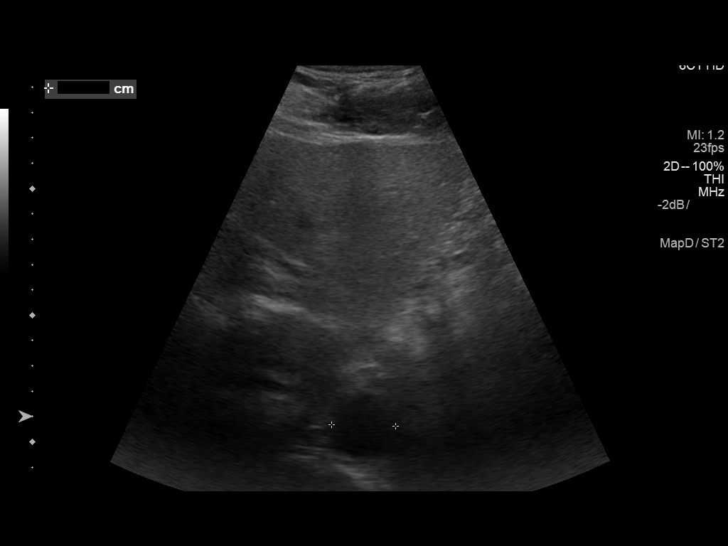
[im 117/117]
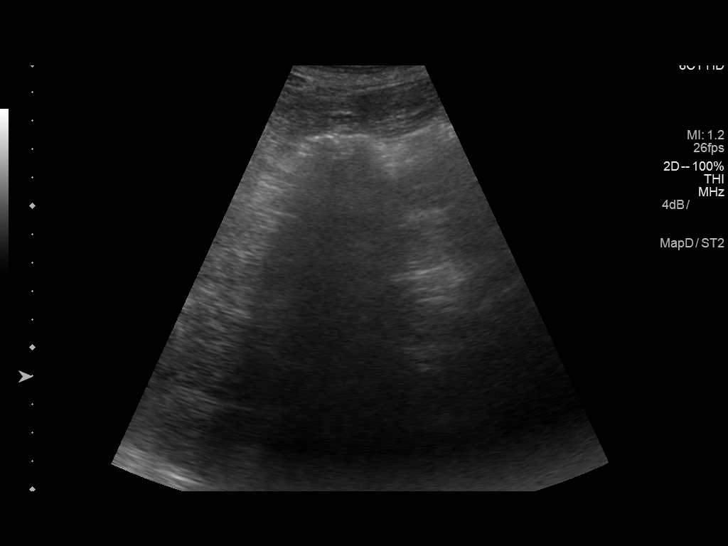

[13 of 25 positions shown; findings below may reference images not displayed]

FINDINGS: Gallbladder: The gallbladder is visualized and multiple gallstones
are present with gallbladder sludge as well. The gallbladder wall
also is somewhat thickened measuring 4.1 mm, but there is no pain
over the gallbladder currently with compression.

Common bile duct: Diameter: Common bile duct measures 3.3 mm in
diameter.

Liver: The parenchyma of the liver is somewhat echogenic and
inhomogeneous consistent with hepatic steatosis. No focal hepatic
abnormality is seen. Portal vein is patent on color Doppler imaging
with normal direction of blood flow towards the liver.

IVC: No abnormality visualized.

Pancreas: The pancreas appears normal in the midbody with portions
of both the head and tail obscured by bowel gas.

Spleen: The spleen measures 9.0 cm.

Right Kidney: Length: 15.0 cm..  No hydronephrosis is seen.

Left Kidney: Length: 15.1 cm.. No hydronephrosis is noted. There is
a cyst in the upper pole present of 3.0 cm.

Abdominal aorta: The abdominal aorta measures 2.7 mm in diameter.

Other findings: None.
IMPRESSION: 1. Multiple gallstones with gallbladder sludge and somewhat
thickened gallbladder wall. However no pain is present over the
gallbladder currently with compression.
2. Inhomogeneous echogenic liver parenchyma consistent with hepatic
steatosis.
3. The pancreas is largely obscured by bowel gas.

## 2020-03-20 ENCOUNTER — Ambulatory Visit (HOSPITAL_COMMUNITY)
Admission: RE | Admit: 2020-03-20 | Discharge: 2020-03-20 | Disposition: A | Discharge status: Home / Self Care | Payer: BC Managed Care – PPO | Source: Ambulatory Visit | Attending: Internal Medicine | Admitting: Internal Medicine

## 2020-03-20 ENCOUNTER — Other Ambulatory Visit: Payer: Self-pay

## 2020-03-20 ENCOUNTER — Other Ambulatory Visit (HOSPITAL_COMMUNITY): Payer: Self-pay | Admitting: Internal Medicine

## 2020-03-20 DIAGNOSIS — M542 Cervicalgia: Secondary | ICD-10-CM | POA: Insufficient documentation

## 2020-03-20 DIAGNOSIS — N529 Male erectile dysfunction, unspecified: Secondary | ICD-10-CM | POA: Diagnosis not present

## 2020-03-20 DIAGNOSIS — M549 Dorsalgia, unspecified: Secondary | ICD-10-CM | POA: Diagnosis not present

## 2020-03-20 DIAGNOSIS — Z0001 Encounter for general adult medical examination with abnormal findings: Secondary | ICD-10-CM | POA: Diagnosis not present

## 2020-03-20 DIAGNOSIS — M4317 Spondylolisthesis, lumbosacral region: Secondary | ICD-10-CM | POA: Diagnosis not present

## 2020-03-20 DIAGNOSIS — J309 Allergic rhinitis, unspecified: Secondary | ICD-10-CM | POA: Diagnosis not present

## 2020-03-20 DIAGNOSIS — E7849 Other hyperlipidemia: Secondary | ICD-10-CM | POA: Diagnosis not present

## 2020-03-20 DIAGNOSIS — M47816 Spondylosis without myelopathy or radiculopathy, lumbar region: Secondary | ICD-10-CM | POA: Diagnosis not present

## 2020-03-20 DIAGNOSIS — M47812 Spondylosis without myelopathy or radiculopathy, cervical region: Secondary | ICD-10-CM | POA: Diagnosis not present

## 2020-03-20 DIAGNOSIS — Z6833 Body mass index (BMI) 33.0-33.9, adult: Secondary | ICD-10-CM | POA: Diagnosis not present

## 2020-03-20 DIAGNOSIS — E6609 Other obesity due to excess calories: Secondary | ICD-10-CM | POA: Diagnosis not present

## 2020-03-24 ENCOUNTER — Other Ambulatory Visit (HOSPITAL_COMMUNITY): Payer: Self-pay | Admitting: Internal Medicine

## 2020-03-24 ENCOUNTER — Other Ambulatory Visit: Payer: Self-pay | Admitting: Internal Medicine

## 2020-03-24 DIAGNOSIS — R319 Hematuria, unspecified: Secondary | ICD-10-CM

## 2020-07-19 DIAGNOSIS — J019 Acute sinusitis, unspecified: Secondary | ICD-10-CM | POA: Diagnosis not present

## 2020-07-19 DIAGNOSIS — Z6833 Body mass index (BMI) 33.0-33.9, adult: Secondary | ICD-10-CM | POA: Diagnosis not present

## 2020-07-19 DIAGNOSIS — E6609 Other obesity due to excess calories: Secondary | ICD-10-CM | POA: Diagnosis not present

## 2020-07-19 DIAGNOSIS — J069 Acute upper respiratory infection, unspecified: Secondary | ICD-10-CM | POA: Diagnosis not present

## 2020-12-11 DIAGNOSIS — M47812 Spondylosis without myelopathy or radiculopathy, cervical region: Secondary | ICD-10-CM | POA: Diagnosis not present

## 2020-12-11 DIAGNOSIS — Z681 Body mass index (BMI) 19 or less, adult: Secondary | ICD-10-CM | POA: Diagnosis not present

## 2020-12-11 DIAGNOSIS — J019 Acute sinusitis, unspecified: Secondary | ICD-10-CM | POA: Diagnosis not present

## 2020-12-11 DIAGNOSIS — H02401 Unspecified ptosis of right eyelid: Secondary | ICD-10-CM | POA: Diagnosis not present

## 2021-01-03 DIAGNOSIS — M542 Cervicalgia: Secondary | ICD-10-CM | POA: Diagnosis not present

## 2021-01-03 DIAGNOSIS — M5030 Other cervical disc degeneration, unspecified cervical region: Secondary | ICD-10-CM | POA: Diagnosis not present

## 2021-01-03 DIAGNOSIS — M25512 Pain in left shoulder: Secondary | ICD-10-CM | POA: Diagnosis not present

## 2021-01-03 DIAGNOSIS — M5412 Radiculopathy, cervical region: Secondary | ICD-10-CM | POA: Diagnosis not present

## 2021-01-17 DIAGNOSIS — M5412 Radiculopathy, cervical region: Secondary | ICD-10-CM | POA: Diagnosis not present

## 2021-01-25 DIAGNOSIS — M542 Cervicalgia: Secondary | ICD-10-CM | POA: Diagnosis not present

## 2021-01-31 DIAGNOSIS — M542 Cervicalgia: Secondary | ICD-10-CM | POA: Diagnosis not present

## 2021-02-08 DIAGNOSIS — E6609 Other obesity due to excess calories: Secondary | ICD-10-CM | POA: Diagnosis not present

## 2021-02-08 DIAGNOSIS — I10 Essential (primary) hypertension: Secondary | ICD-10-CM | POA: Diagnosis not present

## 2021-02-08 DIAGNOSIS — Z1331 Encounter for screening for depression: Secondary | ICD-10-CM | POA: Diagnosis not present

## 2021-02-08 DIAGNOSIS — H6991 Unspecified Eustachian tube disorder, right ear: Secondary | ICD-10-CM | POA: Diagnosis not present

## 2021-02-08 DIAGNOSIS — Z6833 Body mass index (BMI) 33.0-33.9, adult: Secondary | ICD-10-CM | POA: Diagnosis not present

## 2021-02-14 DIAGNOSIS — R52 Pain, unspecified: Secondary | ICD-10-CM | POA: Diagnosis not present

## 2021-05-10 ENCOUNTER — Ambulatory Visit (HOSPITAL_COMMUNITY)
Admission: RE | Admit: 2021-05-10 | Discharge: 2021-05-10 | Disposition: A | Discharge status: Home / Self Care | Payer: BC Managed Care – PPO | Source: Ambulatory Visit | Attending: Physician Assistant | Admitting: Physician Assistant

## 2021-05-10 ENCOUNTER — Other Ambulatory Visit: Payer: Self-pay

## 2021-05-10 ENCOUNTER — Other Ambulatory Visit (HOSPITAL_COMMUNITY): Payer: Self-pay | Admitting: Physician Assistant

## 2021-05-10 DIAGNOSIS — R55 Syncope and collapse: Secondary | ICD-10-CM | POA: Insufficient documentation

## 2021-05-10 DIAGNOSIS — R21 Rash and other nonspecific skin eruption: Secondary | ICD-10-CM | POA: Diagnosis not present

## 2021-05-10 DIAGNOSIS — Z6833 Body mass index (BMI) 33.0-33.9, adult: Secondary | ICD-10-CM | POA: Diagnosis not present

## 2021-05-10 DIAGNOSIS — E6609 Other obesity due to excess calories: Secondary | ICD-10-CM | POA: Diagnosis not present

## 2021-05-10 DIAGNOSIS — L989 Disorder of the skin and subcutaneous tissue, unspecified: Secondary | ICD-10-CM | POA: Diagnosis not present

## 2021-05-14 DIAGNOSIS — M170 Bilateral primary osteoarthritis of knee: Secondary | ICD-10-CM | POA: Diagnosis not present

## 2021-05-17 ENCOUNTER — Encounter: Payer: Self-pay | Admitting: Internal Medicine

## 2021-05-28 DIAGNOSIS — D1801 Hemangioma of skin and subcutaneous tissue: Secondary | ICD-10-CM | POA: Diagnosis not present

## 2021-05-28 DIAGNOSIS — D225 Melanocytic nevi of trunk: Secondary | ICD-10-CM | POA: Diagnosis not present

## 2021-05-28 DIAGNOSIS — L821 Other seborrheic keratosis: Secondary | ICD-10-CM | POA: Diagnosis not present

## 2021-05-28 DIAGNOSIS — L918 Other hypertrophic disorders of the skin: Secondary | ICD-10-CM | POA: Diagnosis not present

## 2021-06-26 DIAGNOSIS — H9311 Tinnitus, right ear: Secondary | ICD-10-CM | POA: Diagnosis not present

## 2021-06-26 DIAGNOSIS — H6991 Unspecified Eustachian tube disorder, right ear: Secondary | ICD-10-CM | POA: Diagnosis not present

## 2021-06-26 DIAGNOSIS — R42 Dizziness and giddiness: Secondary | ICD-10-CM | POA: Diagnosis not present

## 2021-06-26 DIAGNOSIS — H6591 Unspecified nonsuppurative otitis media, right ear: Secondary | ICD-10-CM | POA: Diagnosis not present

## 2021-06-26 DIAGNOSIS — J329 Chronic sinusitis, unspecified: Secondary | ICD-10-CM | POA: Diagnosis not present

## 2021-06-26 DIAGNOSIS — I10 Essential (primary) hypertension: Secondary | ICD-10-CM | POA: Diagnosis not present

## 2021-06-26 DIAGNOSIS — Z6832 Body mass index (BMI) 32.0-32.9, adult: Secondary | ICD-10-CM | POA: Diagnosis not present

## 2021-06-26 DIAGNOSIS — E6609 Other obesity due to excess calories: Secondary | ICD-10-CM | POA: Diagnosis not present

## 2021-08-07 ENCOUNTER — Other Ambulatory Visit: Payer: Self-pay | Admitting: Internal Medicine

## 2021-08-07 ENCOUNTER — Other Ambulatory Visit (HOSPITAL_COMMUNITY): Payer: Self-pay | Admitting: Internal Medicine

## 2021-08-07 DIAGNOSIS — H9311 Tinnitus, right ear: Secondary | ICD-10-CM

## 2021-08-07 DIAGNOSIS — J329 Chronic sinusitis, unspecified: Secondary | ICD-10-CM

## 2021-08-07 DIAGNOSIS — R42 Dizziness and giddiness: Secondary | ICD-10-CM

## 2021-08-08 DIAGNOSIS — J3489 Other specified disorders of nose and nasal sinuses: Secondary | ICD-10-CM | POA: Diagnosis not present

## 2021-08-08 DIAGNOSIS — H9311 Tinnitus, right ear: Secondary | ICD-10-CM | POA: Diagnosis not present

## 2021-08-09 NOTE — Progress Notes (Signed)
CARDIOLOGY CONSULT NOTE       Patient ID: [name] MRN: [id number] DOB/AGE: 1955-12-21 65 y.o.  Admit date: (Not on file) Referring Physician: Gerarda Fraction Primary Physician: Redmond School, MD Primary Cardiologist: New Reason for Consultation: Syncope  Active Problems:   * No active hospital problems. *   HPI:  65 y.o. 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     Vasoactive meds include lisinopril, Viagra and trazodone   05/09/21 was at driving range and felt dizzy when bending over to tee up a ball recurrent then passed out toppling over on grass lasted seconds No nausea, vomiting, palpitations chest pain or dyspnea he has had other self limited episodes that also sound postural/vertiginous He just had a CT of his sinuses and report not available   Married with 2 kids works at [employer] rare ETOH no smoking Mom died of MI and had AS age 41   Lab review Hct 44.1 CR 1.35 BUN 17 K 4.3 TSH 2.8 normal LFTls   ROS All other systems reviewed and negative except as noted above  Past Medical History:  Diagnosis Date   History of kidney stones    Hypertriglyceridemia    OSA on CPAP    POST  SLEEP APNEA SURGERY --- PER SLEEP STUDY 06-26-15-2006  MODERATE OSA   USES CPAP DAILY   Renal disorder    Scrotal swelling    Shortness of breath dyspnea    with exertion   Urgency of urination    OCCASIONAL    No family history on file.  Social History   Socioeconomic History   Marital status: Married    Spouse name: Not on file   Number of children: Not on file   Years of education: Not on file   Highest education level: Not on file  Occupational History   Not on file  Tobacco Use   Smoking status: Never   Smokeless tobacco: Never  Vaping Use   Vaping Use: Never used  Substance and Sexual Activity   Alcohol  use: Not Currently    Comment: RARE   Drug use: No   Sexual activity: Not on file  Other Topics Concern   Not on file  Social History Narrative   Not on file   Social Determinants of Health   Financial Resource Strain: Not on file  Food Insecurity: Not on file  Transportation Needs: Not on file  Physical Activity: Not on file  Stress: Not on file  Social Connections: Not on file  Intimate Partner Violence: Not on file    Past Surgical History:  Procedure Laterality Date   APPENDECTOMY     CHOLECYSTECTOMY N/A 08/07/2018   Procedure: LAPAROSCOPIC CHOLECYSTECTOMY;  Surgeon: Aviva Signs, MD;  Location: [facility];  Service: General;  Laterality: N/A;   COLONOSCOPY     COLONOSCOPY N/A 06/05/2018   Procedure: COLONOSCOPY;  Surgeon: Daneil Dolin, MD;  Location: [facility];  Service: Endoscopy;  Laterality: N/A;  7:30   DISTAL BICEPS TENDON REPAIR Right 01/30/2015   Procedure: DISTAL BICEPS TENDON REPAIR;  Surgeon: Marybelle Killings, MD;  Location: [facility];  Service: Orthopedics;  Laterality: Right;   EXPLORATION LAPAROTOMY/  PARTIAL COLECTOMY WITH RESECTION TERMINAL ILEUM (APPENDICEAL MASS)  01-16-2010   BENIGN NEOPLASM APPENDIX   HYDROCELE EXCISION Right 03/18/2014  Procedure:  RIGHT SPERMACELECTOMY;  Surgeon: Claybon Jabs, MD;  [facility];  Service: Urology;  Laterality: Right;   KNEE ARTHROSCOPY Right 2010   LITHOTRIPSY     LIVER BIOPSY  08/07/2018   Procedure: LIVER BIOPSY;  Surgeon: Aviva Signs, MD;  Location: [facility];  Service: General;;   POLYPECTOMY  06/05/2018   Procedure: POLYPECTOMY;  Surgeon: Daneil Dolin, MD;  Location: [facility];  Service: Endoscopy;;  ascending and descending   TONSILLECTOMY     UVULOPALATOPHARYNGOPLASTY  2000 (APPROX)   W/  DEVIATED SEPTUM REPAIR  AND TONSILLECTOMY      Current Outpatient Medications:    fenofibrate 160 MG tablet, Take 160 mg by mouth daily. , Disp: , Rfl:    olmesartan (BENICAR) 40 MG tablet, Take 40  mg by mouth daily., Disp: , Rfl:    Omega-3 Fatty Acids (FISH OIL) 1200 MG CAPS, Take 1,200 mg by mouth daily., Disp: , Rfl:    traZODone (DESYREL) 50 MG tablet, Take 50 mg by mouth at bedtime., Disp: , Rfl:     Physical Exam: Blood pressure 135/84, pulse 76, height 6\' 2"  (1.88 m), weight 118.8 kg.    Affect appropriate Healthy:  appears stated age 65: normal Neck supple with no adenopathy JVP normal no bruits no thyromegaly Lungs clear with no wheezing and good diaphragmatic motion Heart:  S1/S2 no murmur, no rub, gallop or click PMI normal Abdomen: benighn, BS positve, no tenderness, no AAA no bruit.  No HSM or HJR Post appy and choly  Distal pulses intact with no bruits No edema Neuro non-focal Skin warm and dry No muscular weakness   Labs:   Lab Results  Component Value Date   WBC 13.8 (H) 07/12/2018   HGB 15.3 07/12/2018   HCT 44.3 07/12/2018   MCV 93.5 07/12/2018   PLT 203 07/12/2018   No results for input(s): NA, K, CL, CO2, BUN, CREATININE, CALCIUM, PROT, BILITOT, ALKPHOS, ALT, AST, GLUCOSE in the last 168 hours.  Invalid input(s): LABALBU Lab Results  Component Value Date   TROPONINI <0.03 07/12/2018   No results found for: CHOL No results found for: HDL No results found for: LDLCALC No results found for: TRIG No results found for: CHOLHDL No results found for: LDLDIRECT    Radiology: No results found.  EKG: 2019 SR RBBB    ASSESSMENT AND PLAN:   Syncope:  sounds more postural or inner ear. Discussed need for weight loss  Favor TTE to r/o structural heart dx and ETT to assess hemodynamic response to exercise Also discussed utility of calcium score to risk stratify for CAD  OSA: post surgery using CPAP discussed weight loss HLD:  continue fibrates labs with primary    TTE Calcium score ETT  F/U PRN if testing normal / low risk   Signed: Jenkins Rouge 08/13/2021, 11:18 AM

## 2021-08-13 ENCOUNTER — Other Ambulatory Visit: Payer: Self-pay

## 2021-08-13 ENCOUNTER — Encounter: Payer: Self-pay | Admitting: *Deleted

## 2021-08-13 ENCOUNTER — Ambulatory Visit (INDEPENDENT_AMBULATORY_CARE_PROVIDER_SITE_OTHER): Payer: Medicare Other | Admitting: Cardiovascular Disease

## 2021-08-13 VITALS — BP 135/84 | HR 76 | Ht 74.0 in | Wt 262.0 lb

## 2021-08-13 DIAGNOSIS — R079 Chest pain, unspecified: Secondary | ICD-10-CM

## 2021-08-13 DIAGNOSIS — R55 Syncope and collapse: Secondary | ICD-10-CM

## 2021-08-13 NOTE — Patient Instructions (Signed)
Medication Instructions:  Your physician recommends that you continue on your current medications as directed. Please refer to the Current Medication list given to you today.  *If you need a refill on your cardiac medications before your next appointment, please call your pharmacy*   Lab Work: NONE   If you have labs (blood work) drawn today and your tests are completely normal, you will receive your results only by: [place] (if you have MyChart) OR A paper copy in the mail If you have any lab test that is abnormal or we need to change your treatment, we will call you to review the results.   Testing/Procedures: Your physician has requested that you have an echocardiogram. Echocardiography is a painless test that uses sound waves to create images of your heart. It provides your doctor with information about the size and shape of your heart and how well your heart's chambers and valves are working. This procedure takes approximately one hour. There are no restrictions for this procedure.  Your physician has requested that you have en exercise stress myoview. For further information please visit [email or web address]. Please follow instruction sheet, as given.   Calcium Score    Follow-Up: At [facility], you and your health needs are our priority.  As part of our continuing mission to provide you with exceptional heart care, we have created designated Provider Care Teams.  These Care Teams include your primary Cardiologist (physician) and Advanced Practice Providers (APPs -  Physician Assistants and Nurse Practitioners) who all work together to provide you with the care you need, when you need it.  We recommend signing up for the patient portal called "MyChart".  Sign up information is provided on this After Visit Summary.  MyChart is used to connect with patients for Virtual Visits (Telemedicine).  Patients are able to view lab/test results, encounter notes, upcoming appointments,  etc.  Non-urgent messages can be sent to your provider as well.   To learn more about what you can do with MyChart, go to [email or web address].    Your next appointment:    As Needed   The format for your next appointment:   In Person  Provider:   Jenkins Rouge, MD   Other Instructions Thank you for choosing [facility]!

## 2021-08-15 NOTE — Addendum Note (Signed)
Addended by: Levonne Hubert on: 08/15/2021 04:22 PM   Modules accepted: Orders

## 2021-08-20 ENCOUNTER — Encounter (HOSPITAL_COMMUNITY): Payer: Medicare Other

## 2021-08-20 ENCOUNTER — Ambulatory Visit (HOSPITAL_COMMUNITY)
Admission: RE | Admit: 2021-08-20 | Discharge: 2021-08-20 | Disposition: A | Discharge status: Home / Self Care | Payer: Medicare Other | Source: Ambulatory Visit | Attending: Internal Medicine | Admitting: Internal Medicine

## 2021-08-20 ENCOUNTER — Other Ambulatory Visit: Payer: Self-pay

## 2021-08-20 DIAGNOSIS — R079 Chest pain, unspecified: Secondary | ICD-10-CM

## 2021-08-20 LAB — EXERCISE TOLERANCE TEST
Angina Index: 0
Duke Treadmill Score: 7
Estimated workload: 10.4
Exercise duration (min): 7 min
Exercise duration (sec): 26 s
MPHR: 155 {beats}/min
Peak HR: 150 {beats}/min
Percent HR: 96 %
RPE: 14
Rest HR: 61 {beats}/min
ST Depression (mm): 0 mm

## 2021-09-06 ENCOUNTER — Ambulatory Visit (HOSPITAL_COMMUNITY)
Admission: RE | Admit: 2021-09-06 | Discharge: 2021-09-06 | Disposition: A | Discharge status: Home / Self Care | Payer: Medicare Other | Source: Ambulatory Visit | Attending: Cardiovascular Disease | Admitting: Cardiovascular Disease

## 2021-09-06 ENCOUNTER — Other Ambulatory Visit: Payer: Self-pay

## 2021-09-06 DIAGNOSIS — R55 Syncope and collapse: Secondary | ICD-10-CM

## 2021-09-06 LAB — ECHOCARDIOGRAM COMPLETE
Area-P 1/2: 2.32 cm2
P 1/2 time: 652 msec
S' Lateral: 2.5 cm

## 2021-09-06 NOTE — Progress Notes (Signed)
*  PRELIMINARY RESULTS* Echocardiogram 2D Echocardiogram has been performed.  [name] 09/06/2021, 2:41 PM

## 2021-09-17 ENCOUNTER — Other Ambulatory Visit: Payer: Self-pay

## 2021-09-17 ENCOUNTER — Ambulatory Visit (HOSPITAL_COMMUNITY)
Admission: RE | Admit: 2021-09-17 | Discharge: 2021-09-17 | Disposition: A | Discharge status: Home / Self Care | Payer: Self-pay | Source: Ambulatory Visit | Attending: Cardiovascular Disease | Admitting: Cardiovascular Disease

## 2021-09-17 DIAGNOSIS — R079 Chest pain, unspecified: Secondary | ICD-10-CM | POA: Insufficient documentation

## 2021-09-18 ENCOUNTER — Telehealth: Payer: Self-pay | Admitting: *Deleted

## 2021-09-18 DIAGNOSIS — Z13220 Encounter for screening for lipoid disorders: Secondary | ICD-10-CM

## 2021-09-18 MED ORDER — ROSUVASTATIN CALCIUM 10 MG PO TABS: 10.0000 mg | ORAL_TABLET | Freq: Every day | ORAL | 3 refills | Status: DC

## 2021-09-18 NOTE — Telephone Encounter (Signed)
-----   Message from Josue Hector, MD sent at 09/17/2021  2:14 PM EDT ----- Very high calcium score His ETT was normal needs fasting lipid and liver then start crestor 10 mg f/u lipid and liver 3 months will need yearly stress testing

## 2021-09-19 ENCOUNTER — Other Ambulatory Visit (HOSPITAL_COMMUNITY)
Admission: RE | Admit: 2021-09-19 | Discharge: 2021-09-19 | Disposition: A | Discharge status: Home / Self Care | Payer: Medicare Other | Source: Ambulatory Visit | Attending: Cardiovascular Disease | Admitting: Cardiovascular Disease

## 2021-09-19 DIAGNOSIS — Z13220 Encounter for screening for lipoid disorders: Secondary | ICD-10-CM | POA: Insufficient documentation

## 2021-09-19 LAB — LIPID PANEL
Cholesterol: 164 mg/dL (ref 0–200)
HDL: 35 mg/dL — ABNORMAL LOW (ref >40)
LDL Cholesterol: 93 mg/dL (ref 0–99)
Total CHOL/HDL Ratio: 4.7 RATIO
Triglycerides: 181 mg/dL — ABNORMAL HIGH (ref <150)
VLDL: 36 mg/dL (ref 0–40)

## 2021-09-19 LAB — HEPATIC FUNCTION PANEL
ALT: 84 U/L — ABNORMAL HIGH (ref 0–44)
AST: 51 U/L — ABNORMAL HIGH (ref 15–41)
Albumin: 4.2 g/dL (ref 3.5–5.0)
Alkaline Phosphatase: 47 U/L (ref 38–126)
Bilirubin, Direct: 0.1 mg/dL (ref 0.0–0.2)
Indirect Bilirubin: 0.6 mg/dL (ref 0.3–0.9)
Total Bilirubin: 0.7 mg/dL (ref 0.3–1.2)
Total Protein: 7.6 g/dL (ref 6.5–8.1)

## 2021-09-27 ENCOUNTER — Telehealth: Payer: Self-pay | Admitting: Cardiovascular Disease

## 2021-09-27 DIAGNOSIS — Z13220 Encounter for screening for lipoid disorders: Secondary | ICD-10-CM

## 2021-09-27 NOTE — Telephone Encounter (Signed)
  Pt c/o medication issue:  1. Name of Medication: rosuvastatin (CRESTOR) 10 MG tablet  2. How are you currently taking this medication (dosage and times per day)? Take 1 tablet (10 mg total) by mouth daily.  3. Are you having a reaction (difficulty breathing--STAT)?  4. What is your medication issue? Pt said he felt very nauseous when taking this meds

## 2021-09-28 NOTE — Telephone Encounter (Signed)
Spoke with pt who states that he started taking Crestor 10 mg on 09/20/21 and stopped on 09/25/21 d/t nausea with medication. Pt reports that nausea has stopped since stopping this medication. He would like to know if there is another medication that he can try. He does have an appt. On Monday to fu regarding his liver enzymes with PCP. Please advise.

## 2021-09-28 NOTE — Telephone Encounter (Signed)
Pt notified of referral to lipid clinic and agrees with plan of care.

## 2021-10-01 DIAGNOSIS — B356 Tinea cruris: Secondary | ICD-10-CM | POA: Diagnosis not present

## 2021-10-01 DIAGNOSIS — Z6833 Body mass index (BMI) 33.0-33.9, adult: Secondary | ICD-10-CM | POA: Diagnosis not present

## 2021-10-01 DIAGNOSIS — E6609 Other obesity due to excess calories: Secondary | ICD-10-CM | POA: Diagnosis not present

## 2021-10-01 DIAGNOSIS — R748 Abnormal levels of other serum enzymes: Secondary | ICD-10-CM | POA: Diagnosis not present

## 2021-10-05 DIAGNOSIS — Z6833 Body mass index (BMI) 33.0-33.9, adult: Secondary | ICD-10-CM | POA: Diagnosis not present

## 2021-10-05 DIAGNOSIS — E6609 Other obesity due to excess calories: Secondary | ICD-10-CM | POA: Diagnosis not present

## 2021-10-05 DIAGNOSIS — M179 Osteoarthritis of knee, unspecified: Secondary | ICD-10-CM | POA: Diagnosis not present

## 2021-10-15 DIAGNOSIS — M1711 Unilateral primary osteoarthritis, right knee: Secondary | ICD-10-CM | POA: Diagnosis not present

## 2021-10-15 DIAGNOSIS — M1712 Unilateral primary osteoarthritis, left knee: Secondary | ICD-10-CM | POA: Diagnosis not present

## 2021-11-07 DIAGNOSIS — K7689 Other specified diseases of liver: Secondary | ICD-10-CM | POA: Diagnosis not present

## 2021-11-07 DIAGNOSIS — R944 Abnormal results of kidney function studies: Secondary | ICD-10-CM | POA: Diagnosis not present

## 2022-02-19 ENCOUNTER — Encounter: Payer: Self-pay | Admitting: Internal Medicine

## 2022-02-19 ENCOUNTER — Telehealth (INDEPENDENT_AMBULATORY_CARE_PROVIDER_SITE_OTHER): Payer: Medicare Other | Admitting: Internal Medicine

## 2022-02-19 ENCOUNTER — Telehealth: Payer: Self-pay | Admitting: Internal Medicine

## 2022-02-19 VITALS — Ht 74.0 in | Wt 255.0 lb

## 2022-02-19 DIAGNOSIS — R931 Abnormal findings on diagnostic imaging of heart and coronary circulation: Secondary | ICD-10-CM

## 2022-02-19 DIAGNOSIS — Z8249 Family history of ischemic heart disease and other diseases of the circulatory system: Secondary | ICD-10-CM

## 2022-02-19 DIAGNOSIS — E785 Hyperlipidemia, unspecified: Secondary | ICD-10-CM

## 2022-02-19 MED ORDER — ROSUVASTATIN CALCIUM 5 MG PO TABS: 5.0000 mg | ORAL_TABLET | Freq: Every day | ORAL | 3 refills | Status: DC

## 2022-02-19 NOTE — Patient Instructions (Signed)
Medication Instructions:  ?START crestor '5mg'$  daily ? ?*If you need a refill on your cardiac medications before your next appointment, please call your pharmacy* ? ? ?Lab Work: ?Non-Fasting CMET and LP(a) in about 2 weeks ? ?Fasting NMR lipoprofile to check cholesterol in 3-4 months ?-- before next appointment  ? ?If you have labs (blood work) drawn today and your tests are completely normal, you will receive your results only by: ?MyChart Message (if you have MyChart) OR ?A paper copy in the mail ?If you have any lab test that is abnormal or we need to change your treatment, we will call you to review the results. ? ?Follow-Up: ?At [facility], you and your health needs are our priority.  As part of our continuing mission to provide you with exceptional heart care, we have created designated Provider Care Teams.  These Care Teams include your primary Cardiologist (physician) and Advanced Practice Providers (APPs -  Physician Assistants and Nurse Practitioners) who all work together to provide you with the care you need, when you need it. ? ?We recommend signing up for the patient portal called "MyChart".  Sign up information is provided on this After Visit Summary.  MyChart is used to connect with patients for Virtual Visits (Telemedicine).  Patients are able to view lab/test results, encounter notes, upcoming appointments, etc.  Non-urgent messages can be sent to your provider as well.   ?To learn more about what you can do with MyChart, go to [email or web address].   ? ?Your next appointment:   ?3-4 months with Dr. Debara Pickett  ? ?

## 2022-02-19 NOTE — Progress Notes (Signed)
? ?Virtual Visit via Video Note  ? ?This visit type was conducted due to national recommendations for restrictions regarding the COVID-19 Pandemic (e.g. social distancing) in an effort to limit this patient's exposure and mitigate transmission in our community.  Due to his co-morbid illnesses, this patient is at least at moderate risk for complications without adequate follow up.  This format is felt to be most appropriate for this patient at this time.  All issues noted in this document were discussed and addressed.  A limited physical exam was performed with this format.  Please refer to the patient's chart for his consent to telehealth for [facility]. ? ?   ? ?Date:  02/19/2022  ? ?ID:  [name], DOB 1956-02-03, MRN [id number] ?The patient was identified using 2 identifiers. ? ?Evaluation Performed:  New Patient Evaluation ? ?Patient Location:  ?[address] ?[place] [address] ? ?Provider location:   ?[address] ?[place], [place] [address] ? ?PCP:  Redmond School, MD  ?Cardiologist:  None ?Electrophysiologist:  None  ? ?Chief Complaint:  Manage dyslipidemia ? ?History of Present Illness:   ? ?[name] is a 66 y.o. male who presents via audio/video conferencing for a telehealth visit today.  This is a pleasant 66 year old male with a strong family history of heart disease in both parents, who recently had seen Dr. Johnsie Cancel.  He underwent calcium scoring which was significantly abnormal showing a high calcium score of 2925, 98th percentile for age and sex matched controls.  He also had had exercise treadmill testing and an echocardiogram just prior to that as well which were only mildly abnormal.  He does have a history of dyslipidemia with very high triglycerides in the past but has been reasonably controlled on fenofibrate.  He was started on rosuvastatin 10 mg daily but he said he took it for 4 days and was nauseated every day.  He then stopped it and his symptoms  resolved.  He was camping at the time and conceded that it may have been some other reason why he was nauseated.  He then discarded all the medication.  He is now referred for recommendations for further therapy.  His lipid profile in November 2022 showed total cholesterol 164, triglycerides 181, HDL 35 and LDL of 93.  His target LDL should be less than 70.  He also was noted to have some abnormal liver enzymes and based on ultrasound of the abdomen in 2019 does have hepatic steatosis.  Recent AST and ALT were 51 and 84 respectively.  He says he has been working hard on his diet but understands the importance of medication therapy given his high calcium score. ? ?The patient does not have symptoms concerning for COVID-19 infection (fever, chills, cough, or new SHORTNESS OF BREATH).  ? ? ?Prior CV studies:   ?The following studies were reviewed today: ? ?Chart reviewed, lab work ? ?PMHx:  ?Past Medical History:  ?Diagnosis Date  ? History of kidney stones   ? Hypertriglyceridemia   ? OSA on CPAP   ? POST  SLEEP APNEA SURGERY --- PER SLEEP STUDY 06-26-15-2006  MODERATE OSA   USES CPAP DAILY  ? Renal disorder   ? Scrotal swelling   ? Shortness of breath dyspnea   ? with exertion  ? Urgency of urination   ? OCCASIONAL  ? ? ?Past Surgical History:  ?Procedure Laterality Date  ? APPENDECTOMY    ? CHOLECYSTECTOMY N/A 08/07/2018  ? Procedure: LAPAROSCOPIC  CHOLECYSTECTOMY;  Surgeon: Aviva Signs, MD;  Location: [facility];  Service: General;  Laterality: N/A;  ? COLONOSCOPY    ? COLONOSCOPY N/A 06/05/2018  ? Procedure: COLONOSCOPY;  Surgeon: Daneil Dolin, MD;  Location: [facility];  Service: Endoscopy;  Laterality: N/A;  7:30  ? DISTAL BICEPS TENDON REPAIR Right 01/30/2015  ? Procedure: DISTAL BICEPS TENDON REPAIR;  Surgeon: Marybelle Killings, MD;  Location: [facility];  Service: Orthopedics;  Laterality: Right;  ? EXPLORATION LAPAROTOMY/  PARTIAL COLECTOMY WITH RESECTION TERMINAL ILEUM (APPENDICEAL MASS)  01-16-2010  ? BENIGN  NEOPLASM APPENDIX  ? HYDROCELE EXCISION Right 03/18/2014  ? Procedure:  RIGHT SPERMACELECTOMY;  Surgeon: Claybon Jabs, MD;  Location: [facility];  Service: Urology;  Laterality: Right;  ? KNEE ARTHROSCOPY Right 2010  ? LITHOTRIPSY    ? LIVER BIOPSY  08/07/2018  ? Procedure: LIVER BIOPSY;  Surgeon: Aviva Signs, MD;  Location: [facility];  Service: General;;  ? POLYPECTOMY  06/05/2018  ? Procedure: POLYPECTOMY;  Surgeon: Daneil Dolin, MD;  Location: [facility];  Service: Endoscopy;;  ascending and descending  ? TONSILLECTOMY    ? UVULOPALATOPHARYNGOPLASTY  2000 (APPROX)  ? W/  DEVIATED SEPTUM REPAIR  AND TONSILLECTOMY  ? ? ?FAMHx:  ?No family history on file. ? ?SOCHx:  ? reports that he has never smoked. He has never used smokeless tobacco. He reports that he does not currently use alcohol. He reports that he does not use drugs. ? ?ALLERGIES:  ?Allergies  ?Allergen Reactions  ? Rosuvastatin Other (See Comments)  ?  nausea  ? ? ?MEDS: ? ?Current Meds  ?Medication Sig  ? celecoxib (CELEBREX) 200 MG capsule Take 200 mg by mouth 2 (two) times daily as needed.  ? fenofibrate 160 MG tablet Take 160 mg by mouth daily.   ? Flaxseed, Linseed, (FLAXSEED OIL) 1200 MG CAPS Take 1,200 mg by mouth daily.  ? ibuprofen (ADVIL) 200 MG tablet Take 200 mg by mouth every 6 (six) hours as needed.  ? olmesartan (BENICAR) 40 MG tablet Take 40 mg by mouth daily.  ? Omega-3 Fatty Acids (FISH OIL) 1200 MG CAPS Take 1,200 mg by mouth daily.  ? rosuvastatin (CRESTOR) 5 MG tablet Take 1 tablet (5 mg total) by mouth daily.  ? sodium bicarbonate 650 MG tablet Take 650 mg by mouth daily.  ? traZODone (DESYREL) 50 MG tablet Take 50 mg by mouth at bedtime.  ?  ? ?ROS: ?Pertinent items noted in HPI and remainder of comprehensive ROS otherwise negative. ? ?Labs/Other Tests and Data Reviewed:   ? ?Recent Labs: ?09/19/2021: ALT 84  ? ?Recent Lipid Panel ?Lab Results  ?Component Value Date/Time  ? CHOL 164 09/19/2021 08:18 AM  ? TRIG 181  (H) 09/19/2021 08:18 AM  ? HDL 35 (L) 09/19/2021 08:18 AM  ? CHOLHDL 4.7 09/19/2021 08:18 AM  ? LDLCALC 93 09/19/2021 08:18 AM  ? ? ?Wt Readings from Last 3 Encounters:  ?02/19/22 255 lb (115.7 kg)  ?08/13/21 262 lb (118.8 kg)  ?08/18/18 248 lb (112.5 kg)  ?  ? ?Exam:   ? ?Vital Signs:  Ht '6\' 2"'$  (1.88 m)   Wt 255 lb (115.7 kg)   BMI 32.74 kg/m?   ? ?General appearance: alert and no distress ?Lungs: No visual respiratory difficulty ?Abdomen: Mildly obese ?Extremities: extremities normal, atraumatic, no cyanosis or edema ?Skin: Skin color, texture, turgor normal. No rashes or lesions ?Neurologic: Grossly normal ?Psych: Pleasant ? ?ASSESSMENT & PLAN:   ? ?  Mixed dyslipidemia, goal LDL less than 70 ?High CAC score of 2925, 98th percentile for age and sex matched controls ?History of high triglycerides ?Family history of heart disease in both parents ? ?Mr. Kostelnik has a history of high triglycerides with a strong family history of heart disease in both parents.  He has been on a fibrate but not statin therapy.  He was started on rosuvastatin and may have had nausea associated with this however it also could have been attributable to another reason.  We discussed retrial of the medication, specifically I think he could be on a lower 5 mg dose rather than the 10 mg dose.  This may reduce the chance of side effects but still give him a 35% reduction in his lipids particularly which would get him to target based on his lower LDL.  He is willing to retrial the medicine.  Ultimately we may be able to reduce the dose of his fibrate.  Because of the combination that could potentiate increases in his LFTs, I would like to repeat a comprehensive metabolic profile in about 2 weeks.  In addition he had had an abnormal creatinine around Christmas which appears to be dehydration but was started on some bicarbonate with the plan to repeat metabolic profile anyhow.  Finally, I will add an LP(a) because his coronary disease does seem to  be out of proportion with his lipid profile.  There may be other genetic reasons for this but I would like to see if that is elevated.  If tolerated, we will plan to repeat lipid NMR in about 3 months and follow-up

## 2022-02-19 NOTE — Telephone Encounter (Signed)
Patient called to review e-visit instructions. ?Patient aware that the following changes have been made: start crestor '5mg'$  daily ?Patient aware that they will need the following labs: CMET, LP(a) in 2 weeks  fasting NMR before next visit  ?Patient aware that they will need the following test(s): n/a ? ?3-4 month appointment scheduled for 06/07/22 @ [facility].  ? ?No further assistance needed at this time.  ? ?

## 2022-02-20 ENCOUNTER — Other Ambulatory Visit: Payer: Self-pay | Admitting: *Deleted

## 2022-02-20 DIAGNOSIS — E785 Hyperlipidemia, unspecified: Secondary | ICD-10-CM

## 2022-03-05 DIAGNOSIS — E785 Hyperlipidemia, unspecified: Secondary | ICD-10-CM | POA: Diagnosis not present

## 2022-03-05 DIAGNOSIS — R748 Abnormal levels of other serum enzymes: Secondary | ICD-10-CM | POA: Diagnosis not present

## 2022-03-05 DIAGNOSIS — N289 Disorder of kidney and ureter, unspecified: Secondary | ICD-10-CM | POA: Diagnosis not present

## 2022-03-06 DIAGNOSIS — E6609 Other obesity due to excess calories: Secondary | ICD-10-CM | POA: Diagnosis not present

## 2022-03-06 DIAGNOSIS — I10 Essential (primary) hypertension: Secondary | ICD-10-CM | POA: Diagnosis not present

## 2022-03-06 DIAGNOSIS — M5412 Radiculopathy, cervical region: Secondary | ICD-10-CM | POA: Diagnosis not present

## 2022-03-06 DIAGNOSIS — M47812 Spondylosis without myelopathy or radiculopathy, cervical region: Secondary | ICD-10-CM | POA: Diagnosis not present

## 2022-03-06 LAB — COMPREHENSIVE METABOLIC PANEL
ALT: 66 IU/L — ABNORMAL HIGH (ref 0–44)
AST: 46 IU/L — ABNORMAL HIGH (ref 0–40)
Albumin/Globulin Ratio: 1.6 (ref 1.2–2.2)
Albumin: 4.2 g/dL (ref 3.8–4.8)
Alkaline Phosphatase: 56 IU/L (ref 44–121)
BUN/Creatinine Ratio: 16 (ref 10–24)
BUN: 19 mg/dL (ref 8–27)
Bilirubin Total: 0.4 mg/dL (ref 0.0–1.2)
CO2: 22 mmol/L (ref 20–29)
Calcium: 9.5 mg/dL (ref 8.6–10.2)
Chloride: 105 mmol/L (ref 96–106)
Creatinine, Ser: 1.19 mg/dL (ref 0.76–1.27)
Globulin, Total: 2.7 g/dL (ref 1.5–4.5)
Glucose: 98 mg/dL (ref 70–99)
Potassium: 4.1 mmol/L (ref 3.5–5.2)
Sodium: 143 mmol/L (ref 134–144)
Total Protein: 6.9 g/dL (ref 6.0–8.5)
eGFR: 68 mL/min/{1.73_m2} (ref >59)

## 2022-03-06 LAB — NMR, LIPOPROFILE
Cholesterol, Total: 125 mg/dL (ref 100–199)
HDL Particle Number: 28.2 umol/L — ABNORMAL LOW (ref >=30.5)
HDL-C: 33 mg/dL — ABNORMAL LOW (ref >39)
LDL Particle Number: 849 nmol/L (ref <1000)
LDL Size: 20.2 nm — ABNORMAL LOW (ref >20.5)
LDL-C (NIH Calc): 56 mg/dL (ref 0–99)
LP-IR Score: 67 — ABNORMAL HIGH (ref <=45)
Small LDL Particle Number: 569 nmol/L — ABNORMAL HIGH (ref <=527)
Triglycerides: 222 mg/dL — ABNORMAL HIGH (ref 0–149)

## 2022-03-06 LAB — LIPOPROTEIN A (LPA): Lipoprotein (a): 21.7 nmol/L (ref <75.0)

## 2022-03-18 ENCOUNTER — Encounter: Payer: Self-pay | Admitting: Internal Medicine

## 2022-05-06 DIAGNOSIS — M170 Bilateral primary osteoarthritis of knee: Secondary | ICD-10-CM | POA: Diagnosis not present

## 2022-05-17 ENCOUNTER — Encounter (HOSPITAL_COMMUNITY): Payer: Self-pay | Admitting: Emergency Medicine

## 2022-05-17 ENCOUNTER — Other Ambulatory Visit: Payer: Self-pay

## 2022-05-17 ENCOUNTER — Inpatient Hospital Stay (HOSPITAL_COMMUNITY)
Admission: EM | Admit: 2022-05-17 | Discharge: 2022-05-19 | DRG: 683 | Disposition: A | Discharge status: Home / Self Care | Payer: Medicare Other | Attending: Internal Medicine | Admitting: Internal Medicine

## 2022-05-17 ENCOUNTER — Observation Stay (HOSPITAL_COMMUNITY): Payer: Medicare Other

## 2022-05-17 DIAGNOSIS — Z87442 Personal history of urinary calculi: Secondary | ICD-10-CM | POA: Diagnosis not present

## 2022-05-17 DIAGNOSIS — Z9049 Acquired absence of other specified parts of digestive tract: Secondary | ICD-10-CM

## 2022-05-17 DIAGNOSIS — Z20822 Contact with and (suspected) exposure to covid-19: Secondary | ICD-10-CM | POA: Diagnosis not present

## 2022-05-17 DIAGNOSIS — E669 Obesity, unspecified: Secondary | ICD-10-CM | POA: Diagnosis not present

## 2022-05-17 DIAGNOSIS — I10 Essential (primary) hypertension: Secondary | ICD-10-CM | POA: Diagnosis present

## 2022-05-17 DIAGNOSIS — Z888 Allergy status to other drugs, medicaments and biological substances status: Secondary | ICD-10-CM

## 2022-05-17 DIAGNOSIS — Z79891 Long term (current) use of opiate analgesic: Secondary | ICD-10-CM

## 2022-05-17 DIAGNOSIS — G4733 Obstructive sleep apnea (adult) (pediatric): Secondary | ICD-10-CM | POA: Diagnosis not present

## 2022-05-17 DIAGNOSIS — Z79899 Other long term (current) drug therapy: Secondary | ICD-10-CM

## 2022-05-17 DIAGNOSIS — N179 Acute kidney failure, unspecified: Secondary | ICD-10-CM | POA: Diagnosis not present

## 2022-05-17 DIAGNOSIS — R197 Diarrhea, unspecified: Secondary | ICD-10-CM | POA: Diagnosis not present

## 2022-05-17 DIAGNOSIS — N281 Cyst of kidney, acquired: Secondary | ICD-10-CM | POA: Diagnosis not present

## 2022-05-17 DIAGNOSIS — I959 Hypotension, unspecified: Secondary | ICD-10-CM | POA: Diagnosis present

## 2022-05-17 DIAGNOSIS — Z6832 Body mass index (BMI) 32.0-32.9, adult: Secondary | ICD-10-CM

## 2022-05-17 DIAGNOSIS — E860 Dehydration: Secondary | ICD-10-CM | POA: Diagnosis present

## 2022-05-17 DIAGNOSIS — A080 Rotaviral enteritis: Secondary | ICD-10-CM | POA: Diagnosis not present

## 2022-05-17 DIAGNOSIS — E781 Pure hyperglyceridemia: Secondary | ICD-10-CM | POA: Diagnosis not present

## 2022-05-17 LAB — COMPREHENSIVE METABOLIC PANEL
ALT: 59 U/L — ABNORMAL HIGH (ref 0–44)
AST: 35 U/L (ref 15–41)
Albumin: 4.1 g/dL (ref 3.5–5.0)
Alkaline Phosphatase: 49 U/L (ref 38–126)
Anion gap: 12 (ref 5–15)
BUN: 49 mg/dL — ABNORMAL HIGH (ref 8–23)
CO2: 16 mmol/L — ABNORMAL LOW (ref 22–32)
Calcium: 8.7 mg/dL — ABNORMAL LOW (ref 8.9–10.3)
Chloride: 105 mmol/L (ref 98–111)
Creatinine, Ser: 4.91 mg/dL — ABNORMAL HIGH (ref 0.61–1.24)
GFR, Estimated: 12 mL/min — ABNORMAL LOW (ref >60)
Glucose, Bld: 121 mg/dL — ABNORMAL HIGH (ref 70–99)
Potassium: 3.9 mmol/L (ref 3.5–5.1)
Sodium: 133 mmol/L — ABNORMAL LOW (ref 135–145)
Total Bilirubin: 0.9 mg/dL (ref 0.3–1.2)
Total Protein: 7.6 g/dL (ref 6.5–8.1)

## 2022-05-17 LAB — CBC WITH DIFFERENTIAL/PLATELET
Abs Immature Granulocytes: 0.05 10*3/uL (ref 0.00–0.07)
Basophils Absolute: 0 10*3/uL (ref 0.0–0.1)
Basophils Relative: 0 %
Eosinophils Absolute: 0.1 10*3/uL (ref 0.0–0.5)
Eosinophils Relative: 1 %
HCT: 50.5 % (ref 39.0–52.0)
Hemoglobin: 17.2 g/dL — ABNORMAL HIGH (ref 13.0–17.0)
Immature Granulocytes: 1 %
Lymphocytes Relative: 13 %
Lymphs Abs: 1.4 10*3/uL (ref 0.7–4.0)
MCH: 31.5 pg (ref 26.0–34.0)
MCHC: 34.1 g/dL (ref 30.0–36.0)
MCV: 92.5 fL (ref 80.0–100.0)
Monocytes Absolute: 1.8 10*3/uL — ABNORMAL HIGH (ref 0.1–1.0)
Monocytes Relative: 16 %
Neutro Abs: 7.6 10*3/uL (ref 1.7–7.7)
Neutrophils Relative %: 69 %
Platelets: 249 10*3/uL (ref 150–400)
RBC: 5.46 MIL/uL (ref 4.22–5.81)
RDW: 13.7 % (ref 11.5–15.5)
WBC: 10.9 10*3/uL — ABNORMAL HIGH (ref 4.0–10.5)
nRBC: 0 % (ref 0.0–0.2)

## 2022-05-17 LAB — CK: Total CK: 42 U/L — ABNORMAL LOW (ref 49–397)

## 2022-05-17 LAB — URINALYSIS, ROUTINE W REFLEX MICROSCOPIC
Bacteria, UA: NONE SEEN
Bilirubin Urine: NEGATIVE
Glucose, UA: NEGATIVE mg/dL
Ketones, ur: NEGATIVE mg/dL
Leukocytes,Ua: NEGATIVE
Nitrite: NEGATIVE
Protein, ur: 100 mg/dL — AB
Specific Gravity, Urine: 1.017 (ref 1.005–1.030)
pH: 5 (ref 5.0–8.0)

## 2022-05-17 LAB — C DIFFICILE QUICK SCREEN W PCR REFLEX
C Diff antigen: NEGATIVE
C Diff interpretation: NOT DETECTED
C Diff toxin: NEGATIVE

## 2022-05-17 LAB — LACTIC ACID, PLASMA
Lactic Acid, Venous: 1.1 mmol/L (ref 0.5–1.9)
Lactic Acid, Venous: 1 mmol/L (ref 0.5–1.9)

## 2022-05-17 LAB — HIV ANTIBODY (ROUTINE TESTING W REFLEX): HIV Screen 4th Generation wRfx: NONREACTIVE

## 2022-05-17 LAB — SARS CORONAVIRUS 2 BY RT PCR: SARS Coronavirus 2 by RT PCR: NEGATIVE

## 2022-05-17 MED ORDER — SODIUM CHLORIDE 0.9 % IV SOLN: INTRAVENOUS | Status: DC

## 2022-05-17 MED ORDER — HEPARIN SODIUM (PORCINE) 5000 UNIT/ML IJ SOLN
5000.0000 [IU] | Freq: Three times a day (TID) | INTRAMUSCULAR | Status: DC
Start: 2022-05-17 — End: 2022-05-19
  Administered 2022-05-17 – 2022-05-19 (×6): 5000 [IU] via SUBCUTANEOUS
  Filled 2022-05-17 (×6): qty 1

## 2022-05-17 MED ORDER — SODIUM CHLORIDE 0.9 % IV BOLUS
1000.0000 mL | Freq: Once | INTRAVENOUS | Status: AC
  Administered 2022-05-17: 1000 mL via INTRAVENOUS

## 2022-05-17 MED ORDER — ONDANSETRON HCL 4 MG/2ML IJ SOLN
4.0000 mg | Freq: Once | INTRAMUSCULAR | Status: DC
  Filled 2022-05-17: qty 2

## 2022-05-17 MED ORDER — LACTATED RINGERS IV BOLUS
1000.0000 mL | Freq: Once | INTRAVENOUS | Status: AC
  Administered 2022-05-17: 1000 mL via INTRAVENOUS

## 2022-05-17 MED ORDER — ROSUVASTATIN CALCIUM 10 MG PO TABS
5.0000 mg | ORAL_TABLET | Freq: Every day | ORAL | Status: DC
  Administered 2022-05-17 – 2022-05-19 (×3): 5 mg via ORAL
  Filled 2022-05-17 (×3): qty 1

## 2022-05-17 MED ORDER — ACETAMINOPHEN 650 MG RE SUPP: 650.0000 mg | Freq: Four times a day (QID) | RECTAL | Status: DC | PRN

## 2022-05-17 MED ORDER — ONDANSETRON HCL 4 MG PO TABS: 4.0000 mg | ORAL_TABLET | Freq: Four times a day (QID) | ORAL | Status: DC | PRN

## 2022-05-17 MED ORDER — ONDANSETRON HCL 4 MG/2ML IJ SOLN: 4.0000 mg | Freq: Four times a day (QID) | INTRAMUSCULAR | Status: DC | PRN

## 2022-05-17 MED ORDER — SODIUM BICARBONATE 650 MG PO TABS
650.0000 mg | ORAL_TABLET | Freq: Every day | ORAL | Status: DC
  Administered 2022-05-17 – 2022-05-19 (×3): 650 mg via ORAL
  Filled 2022-05-17 (×3): qty 1

## 2022-05-17 MED ORDER — ACETAMINOPHEN 325 MG PO TABS: 650.0000 mg | ORAL_TABLET | Freq: Four times a day (QID) | ORAL | Status: DC | PRN

## 2022-05-17 MED ORDER — FENOFIBRATE 160 MG PO TABS
160.0000 mg | ORAL_TABLET | Freq: Every day | ORAL | Status: DC
  Administered 2022-05-17 – 2022-05-19 (×3): 160 mg via ORAL
  Filled 2022-05-17 (×3): qty 1

## 2022-05-17 MED ORDER — TRAZODONE HCL 50 MG PO TABS
50.0000 mg | ORAL_TABLET | Freq: Every day | ORAL | Status: DC
  Administered 2022-05-17 – 2022-05-18 (×2): 50 mg via ORAL
  Filled 2022-05-17 (×2): qty 1

## 2022-05-17 NOTE — ED Provider Notes (Signed)
[facility] EMERGENCY DEPARTMENT Provider Note   CSN: [id number] Arrival date & time: 05/17/22  1003     History  Chief Complaint  Patient presents with   Diarrhea    [name] is a 66 y.o. male with a history including sleep apnea, kidney stones, hypertriglyceridemia presenting for evaluation of a 2-day history of diarrhea, reporting too numerous to count episodes of passing pure liquid stool which started 2 nights ago.  He denies abdominal pain or distention, also has had no nausea, vomiting, no recognized fevers.  He does have little bit of abdominal cramping prior to bowel movements but otherwise has no discomfort.  He has noticed feeling fatigued and a little lightheaded with positional changes.  He got concerned when he woke this morning and did not need to urinate which is atypical.  He took a dose of Imodium yesterday afternoon which did not relieve his symptoms.  He has been drinking water, drink a protein drink yesterday, but states any p.o. intake just triggers another diarrheal event.  He has had no recent travel, no antibiotic use, no exposures to others with diarrhea or other GI illness.  The history is provided by the patient.       Home Medications Prior to Admission medications   Medication Sig Start Date End Date Taking? Authorizing Provider  celecoxib (CELEBREX) 200 MG capsule Take 200 mg by mouth 2 (two) times daily as needed.   Yes [provider]  fenofibrate 160 MG tablet Take 160 mg by mouth daily.    Yes [provider]  Flaxseed, Linseed, (FLAXSEED OIL) 1200 MG CAPS Take 1,200 mg by mouth daily.   Yes [provider]  ibuprofen (ADVIL) 200 MG tablet Take 200 mg by mouth every 6 (six) hours as needed.   Yes [provider]  olmesartan (BENICAR) 40 MG tablet Take 40 mg by mouth daily. 07/24/21  Yes [provider]  Omega-3 Fatty Acids (FISH OIL) 1200 MG CAPS Take 1,200 mg by mouth daily.   Yes [provider]  rosuvastatin (CRESTOR) 5 MG tablet Take 1 tablet (5 mg total) by mouth daily. 02/19/22 05/20/22 Yes Hilty, Nadean Corwin, MD  sodium bicarbonate 650 MG tablet Take 650 mg by mouth daily. 12/06/21  Yes [provider]  traZODone (DESYREL) 50 MG tablet Take 50 mg by mouth at bedtime.   Yes [provider]      Allergies    Rosuvastatin    Review of Systems   Review of Systems  Constitutional:  Positive for fatigue. Negative for fever.  HENT: Negative.    Eyes: Negative.   Respiratory:  Positive for cough. Negative for chest tightness and shortness of breath.        Wife at bedside states he has been coughing more than normal, he denies shortness of breath.  Cardiovascular:  Negative for chest pain.  Gastrointestinal:  Positive for diarrhea. Negative for abdominal distention, abdominal pain, nausea and vomiting.  Genitourinary:  Positive for decreased urine volume.  Musculoskeletal:  Negative for arthralgias, joint swelling and neck pain.  Skin: Negative.  Negative for rash and wound.  Neurological:  Positive for light-headedness. Negative for dizziness, weakness, numbness and headaches.  Psychiatric/Behavioral: Negative.      Physical Exam Updated Vital Signs BP (!) 83/61 (BP Location: Left Arm)   Pulse 79   Temp (!) 97.4 F (36.3 C) (Oral)   Resp 18   Ht '6\' 2"'$  (1.88 m)   Wt 114.4 kg  SpO2 97%   BMI 32.39 kg/m  Physical Exam Vitals and nursing note reviewed.  Constitutional:      Appearance: Normal appearance. He is well-developed.  HENT:     Head: Normocephalic and atraumatic.     Mouth/Throat:     Mouth: Mucous membranes are dry.     Comments: Hoarseness of voice noted.denies throat pain. Eyes:     Conjunctiva/sclera: Conjunctivae normal.  Cardiovascular:     Rate and Rhythm: Normal rate and regular rhythm.     Heart sounds: Normal heart sounds.     Comments: Hypotensive Pulmonary:     Effort: Pulmonary effort is normal.     Breath  sounds: Normal breath sounds. No wheezing.  Abdominal:     General: Abdomen is protuberant. Bowel sounds are increased.     Palpations: Abdomen is soft.     Tenderness: There is no abdominal tenderness. There is no guarding or rebound.  Musculoskeletal:        General: Normal range of motion.     Cervical back: Normal range of motion.  Skin:    General: Skin is warm and dry.  Neurological:     Mental Status: He is alert.     ED Results / Procedures / Treatments   Labs (all labs ordered are listed, but only abnormal results are displayed) Labs Reviewed  CBC WITH DIFFERENTIAL/PLATELET - Abnormal; Notable for the following components:      Result Value   WBC 10.9 (*)    Hemoglobin 17.2 (*)    Monocytes Absolute 1.8 (*)    All other components within normal limits  COMPREHENSIVE METABOLIC PANEL - Abnormal; Notable for the following components:   Sodium 133 (*)    CO2 16 (*)    Glucose, Bld 121 (*)    BUN 49 (*)    Creatinine, Ser 4.91 (*)    Calcium 8.7 (*)    ALT 59 (*)    GFR, Estimated 12 (*)    All other components within normal limits  CULTURE, BLOOD (ROUTINE X 2)  CULTURE, BLOOD (ROUTINE X 2)  C DIFFICILE QUICK SCREEN W PCR REFLEX    SARS CORONAVIRUS 2 BY RT PCR  LACTIC ACID, PLASMA  LACTIC ACID, PLASMA  URINALYSIS, ROUTINE W REFLEX MICROSCOPIC    EKG None  Radiology No results found.  Procedures Procedures    Medications Ordered in ED Medications  ondansetron (ZOFRAN) injection 4 mg (0 mg Intravenous Hold 05/17/22 1043)  lactated ringers bolus 1,000 mL (0 mLs Intravenous Stopped 05/17/22 1142)  sodium chloride 0.9 % bolus 1,000 mL (1,000 mLs Intravenous New Bag/Given 05/17/22 1142)    ED Course/ Medical Decision Making/ A&P                           Medical Decision Making Patient with diarrhea of unclear etiology, presumed infectious.  C. difficile is pending at this time.  We will also order a complete stool panel.  His lactic acid is normal, I do  not think his hypotension reflects sepsis but he is significantly dehydrated at this time, he has significant acute renal failure with a creatinine of 4.91, a BUN of 49.  His WBC count is slightly elevated at 10.9, hemoglobin is elevated at 17.2, probably secondary to hemoconcentration.  He is receiving IV fluids.  Patient will require hospitalization.  Amount and/or Complexity of Data Reviewed Labs: ordered.    Details: Details per above Discussion of management or  test interpretation with external provider(s): Call placed to the hospitalist service for admission.  Patient discussed with Dr. Carles Collet who accepts patient for admission.  Risk Decision regarding hospitalization.           Final Clinical Impression(s) / ED Diagnoses Final diagnoses:  Diarrhea, unspecified type  Dehydration  Acute renal failure, unspecified acute renal failure type [facility])    Rx / DC Orders ED Discharge Orders     None         Landis Martins 05/17/22 1223    Milton Ferguson, MD 05/17/22 1622

## 2022-05-17 NOTE — Assessment & Plan Note (Addendum)
Secondary to volume depletion Baseline creatinine 1.0-1.1 -presented with serum creatinine 4.91 Renal ultrasound--neg hydronphrosis increase IV fluids to 125 cc/hr>>improving Certainly, this has been compounded by his concomitant use of Celebrex and olmesartan which have been discontinued -serum creatinine 1.56 on day of d/c

## 2022-05-17 NOTE — Assessment & Plan Note (Signed)
BMI 32.39 Lifestyle modification

## 2022-05-17 NOTE — ED Triage Notes (Signed)
Pt reports diarrhea for 2 days and "no urination since last night." Denies fevers. Reports take immodium yesterday with no relief.

## 2022-05-17 NOTE — Hospital Course (Signed)
66 year old male with a history of hypertriglyceridemia and OSA presenting with diarrhea x 2 days.  The patient states that he began having diarrhea on the evening of 05/15/22.  He states that on 05/16/2022 he had about 5-10 bowel movements that were watery.  He denied any fevers, chills, abdominal pain, vomiting.  He did have some nausea.  The patient tried taking some Imodium x2 on 05/16/2022.  He continued to have loose stools.  There is no hematochezia, melena.  He began experiencing some generalized weakness and dizziness.  As result, he presented to the emergency department for further evaluation.  The patient denies any recent antibiotics.  He denies any recent travels or eating any exotic foods.  However, the patient stated that he at at [facility] on the evening of 05/15/2022.  His wife also ate neighbors, but she did not have any diarrheal illness. He is also eating carryout at 2 other establishments in the past week.  Again, his wife who ate at the same establishments did not have any diarrheal illness.  He has not had any sick contacts. In the ED, the patient was afebrile and hypotensive with initial blood pressure of 75/50.  After 1 L of fluid, the patient's blood pressure improved to 94/62.  Oxygen saturation was 100% room air.  WBC 10.9, hemoglobin 17.2, platelets 249,000.  AST 35, ALT 59, alk phosphatase 49, total bilirubin 0.9, albumin 4.1.  Sodium 133, potassium 3.9, bicarbonate 16, serum creatinine 4.91.  The patient was given 1 L normal saline and started on maintenance fluids.  He was admitted for further evaluation and treatment of his AKI.

## 2022-05-17 NOTE — H&P (Signed)
History and Physical    Patient: [name] NLG:[id number] DOB: 1956/04/02 DOA: 05/17/2022 DOS: the patient was seen and examined on 05/17/2022 PCP: Redmond School, MD  Patient coming from: Home  Chief Complaint:  Chief Complaint  Patient presents with   Diarrhea   HPI: [name] is a 66 year old male with a history of hypertriglyceridemia and OSA presenting with diarrhea x 2 days.  The patient states that he began having diarrhea on the evening of 05/15/22.  He states that on 05/16/2022 he had about 5-10 bowel movements that were watery.  He denied any fevers, chills, abdominal pain, vomiting.  He did have some nausea.  The patient tried taking some Imodium x2 on 05/16/2022.  He continued to have loose stools.  There is no hematochezia, melena.  He began experiencing some generalized weakness and dizziness.  As result, he presented to the emergency department for further evaluation.  The patient denies any recent antibiotics.  He denies any recent travels or eating any exotic foods.  However, the patient stated that he at at [facility] on the evening of 05/15/2022.  His wife also ate neighbors, but she did not have any diarrheal illness. He is also eating carryout at 2 other establishments in the past week.  Again, his wife who ate at the same establishments did not have any diarrheal illness.  He has not had any sick contacts. In the ED, the patient was afebrile and hypotensive with initial blood pressure of 75/50.  After 1 L of fluid, the patient's blood pressure improved to 94/62.  Oxygen saturation was 100% room air.  WBC 10.9, hemoglobin 17.2, platelets 249,000.  AST 35, ALT 59, alk phosphatase 49, total bilirubin 0.9, albumin 4.1.  Sodium 133, potassium 3.9, bicarbonate 16, serum creatinine 4.91.  The patient was given 1 L normal saline and started on maintenance fluids.  He was admitted for further evaluation and treatment of his AKI.  Review of Systems: As mentioned in the history  of present illness. All other systems reviewed and are negative. Past Medical History:  Diagnosis Date   History of kidney stones    Hypertriglyceridemia    OSA on CPAP    POST  SLEEP APNEA SURGERY --- PER SLEEP STUDY 06-26-15-2006  MODERATE OSA   USES CPAP DAILY   Renal disorder    Scrotal swelling    Shortness of breath dyspnea    with exertion   Urgency of urination    OCCASIONAL   Past Surgical History:  Procedure Laterality Date   APPENDECTOMY     CHOLECYSTECTOMY N/A 08/07/2018   Procedure: LAPAROSCOPIC CHOLECYSTECTOMY;  Surgeon: Aviva Signs, MD;  Location: [facility];  Service: General;  Laterality: N/A;   COLONOSCOPY     COLONOSCOPY N/A 06/05/2018   Procedure: COLONOSCOPY;  Surgeon: Daneil Dolin, MD;  [facility];  Service: Endoscopy;  Laterality: N/A;  7:30   DISTAL BICEPS TENDON REPAIR Right 01/30/2015   Procedure: DISTAL BICEPS TENDON REPAIR;  Surgeon: Marybelle Killings, MD;  Location: [place];  Service: Orthopedics;  Laterality: Right;   EXPLORATION LAPAROTOMY/  PARTIAL COLECTOMY WITH RESECTION TERMINAL ILEUM (APPENDICEAL MASS)  01-16-2010   BENIGN NEOPLASM APPENDIX   HYDROCELE EXCISION Right 03/18/2014   Procedure:  RIGHT SPERMACELECTOMY;  Surgeon: Claybon Jabs, MD;  Location: [facility];  Service: Urology;  Laterality: Right;   KNEE ARTHROSCOPY Right 2010   LITHOTRIPSY     LIVER BIOPSY  08/07/2018   Procedure: LIVER BIOPSY;  Surgeon: Aviva Signs, MD;  Location: [facility];  Service: General;;   POLYPECTOMY  06/05/2018   Procedure: POLYPECTOMY;  Surgeon: Daneil Dolin, MD;  [facility];  Service: Endoscopy;;  ascending and descending   TONSILLECTOMY     UVULOPALATOPHARYNGOPLASTY  2000 (APPROX)   W/  DEVIATED SEPTUM REPAIR  AND TONSILLECTOMY   Social History:  reports that he has never smoked. He has never used smokeless tobacco. He reports that he does not currently use alcohol. He reports that he does not use drugs.  Allergies   Allergen Reactions   Rosuvastatin Other (See Comments)    nausea    No family history on file.  Prior to Admission medications   Medication Sig Start Date End Date Taking? Authorizing Provider  celecoxib (CELEBREX) 200 MG capsule Take 200 mg by mouth 2 (two) times daily as needed.   Yes [provider]  fenofibrate 160 MG tablet Take 160 mg by mouth daily.    Yes [provider]  Flaxseed, Linseed, (FLAXSEED OIL) 1200 MG CAPS Take 1,200 mg by mouth daily.   Yes [provider]  ibuprofen (ADVIL) 200 MG tablet Take 200 mg by mouth every 6 (six) hours as needed.   Yes [provider]  olmesartan (BENICAR) 40 MG tablet Take 40 mg by mouth daily. 07/24/21  Yes [provider]  Omega-3 Fatty Acids (FISH OIL) 1200 MG CAPS Take 1,200 mg by mouth daily.   Yes [provider]  rosuvastatin (CRESTOR) 5 MG tablet Take 1 tablet (5 mg total) by mouth daily. 02/19/22 05/20/22 Yes Hilty, Nadean Corwin, MD  sodium bicarbonate 650 MG tablet Take 650 mg by mouth daily. 12/06/21  Yes [provider]  traZODone (DESYREL) 50 MG tablet Take 50 mg by mouth at bedtime.   Yes [provider]    Physical Exam: Vitals:   05/17/22 1015 05/17/22 1018 05/17/22 1021 05/17/22 1030  BP:  (!) 75/60 (!) 83/61   Pulse: 85 80  79  Resp: (!) '21 19  18  ' Temp:  (!) 97.4 F (36.3 C)    TempSrc:  Oral    SpO2: 99% 95%  97%  Weight:      Height:       GENERAL:  A&O x 3, NAD, well developed, cooperative, follows commands HEENT: [place]/AT, No thrush, No icterus, No oral ulcers Neck:  No neck mass, No meningismus, soft, supple CV: RRR, no S3, no S4, no rub, no JVD Lungs:  CTA, no wheeze, no rhonchi, good air movement Abd: soft/NT +BS, nondistended Ext: No edema, no lymphangitis, no cyanosis, no rashes Neuro:  CN II-XII intact, strength 4/5 in RUE, RLE, strength 4/5 LUE, LLE; sensation intact bilateral; no dysmetria; babinski equivocal  Data Reviewed: Data  reviewed in history  Assessment and Plan: * AKI (acute kidney injury) ([place]) Secondary to volume depletion Baseline creatinine 1.0-1.1 -presented with serum creatinine 4.91 Renal ultrasound Start IV fluids A.m. BMP Certainly, this has been compounded by his concomitant use of Celebrex and olmesartan which have been discontinued  Essential hypertension Discontinue olmesartan in the setting of AKI and hypotension  Class 1 obesity BMI 32.39 Lifestyle modification  Hypertriglyceridemia Continue Crestor and fenofibrate  Diarrhea Check Cdiff Stool pathogen panel      Advance Care Planning: FULL CODE  Consults: none  Family Communication: spouse 6/30  Severity of Illness: The appropriate patient status for this patient is OBSERVATION. Observation status is judged to be reasonable and necessary in order  to provide the required intensity of service to ensure the patient's safety. The patient's presenting symptoms, physical exam findings, and initial radiographic and laboratory data in the context of their medical condition is felt to place them at decreased risk for further clinical deterioration. Furthermore, it is anticipated that the patient will be medically stable for discharge from the hospital within 2 midnights of admission.   Author: Orson Eva, MD 05/17/2022 1:12 PM  For on call review [email or web address].

## 2022-05-17 NOTE — Assessment & Plan Note (Signed)
Check Cdiff--neg Stool pathogen panel>>rotavirus -prn imodium

## 2022-05-17 NOTE — Progress Notes (Signed)
Nursing Progress Note   Patient arrived to unit with stable vitals.  No dizziness reported. Patient was able to void and have bm for samples ordered in ED. Samples taken to lab. Patient transported at this time to [facility]. Wife is at bedside.   Vitals:   05/17/22 1300 05/17/22 1424  BP: 90/70 97/74  Pulse: 68 75  Resp: 17 19  Temp:  97.9 F (36.6 C)  SpO2: 98% 99%    Alfonse Alpers RN, BSN 05/17/2022 3:04 PM

## 2022-05-17 NOTE — Assessment & Plan Note (Signed)
Discontinue olmesartan in the setting of AKI and hypotension -BP remains controlled

## 2022-05-17 NOTE — Assessment & Plan Note (Signed)
Continue Crestor and fenofibrate 

## 2022-05-18 DIAGNOSIS — I10 Essential (primary) hypertension: Secondary | ICD-10-CM | POA: Diagnosis not present

## 2022-05-18 DIAGNOSIS — E669 Obesity, unspecified: Secondary | ICD-10-CM | POA: Diagnosis not present

## 2022-05-18 DIAGNOSIS — N179 Acute kidney failure, unspecified: Secondary | ICD-10-CM | POA: Diagnosis not present

## 2022-05-18 DIAGNOSIS — R197 Diarrhea, unspecified: Secondary | ICD-10-CM | POA: Diagnosis not present

## 2022-05-18 DIAGNOSIS — E781 Pure hyperglyceridemia: Secondary | ICD-10-CM

## 2022-05-18 LAB — BASIC METABOLIC PANEL
Anion gap: 3 — ABNORMAL LOW (ref 5–15)
BUN: 48 mg/dL — ABNORMAL HIGH (ref 8–23)
CO2: 24 mmol/L (ref 22–32)
Calcium: 8.2 mg/dL — ABNORMAL LOW (ref 8.9–10.3)
Chloride: 109 mmol/L (ref 98–111)
Creatinine, Ser: 2.76 mg/dL — ABNORMAL HIGH (ref 0.61–1.24)
GFR, Estimated: 25 mL/min — ABNORMAL LOW (ref >60)
Glucose, Bld: 96 mg/dL (ref 70–99)
Potassium: 4.6 mmol/L (ref 3.5–5.1)
Sodium: 136 mmol/L (ref 135–145)

## 2022-05-18 LAB — GASTROINTESTINAL PANEL BY PCR, STOOL (REPLACES STOOL CULTURE)

## 2022-05-18 LAB — CBC
HCT: 45 % (ref 39.0–52.0)
Hemoglobin: 15 g/dL (ref 13.0–17.0)
MCH: 31.4 pg (ref 26.0–34.0)
MCHC: 33.3 g/dL (ref 30.0–36.0)
MCV: 94.1 fL (ref 80.0–100.0)
Platelets: 200 10*3/uL (ref 150–400)
RBC: 4.78 MIL/uL (ref 4.22–5.81)
RDW: 13.5 % (ref 11.5–15.5)
WBC: 8.1 10*3/uL (ref 4.0–10.5)
nRBC: 0 % (ref 0.0–0.2)

## 2022-05-18 LAB — URINALYSIS, COMPLETE (UACMP) WITH MICROSCOPIC
Bacteria, UA: NONE SEEN
Bilirubin Urine: NEGATIVE
Glucose, UA: NEGATIVE mg/dL
Ketones, ur: NEGATIVE mg/dL
Leukocytes,Ua: NEGATIVE
Nitrite: NEGATIVE
Protein, ur: NEGATIVE mg/dL
Specific Gravity, Urine: 1.013 (ref 1.005–1.030)
pH: 5 (ref 5.0–8.0)

## 2022-05-18 NOTE — Progress Notes (Signed)
Patient brought his CPAP from home after one night use of dream station.

## 2022-05-18 NOTE — Progress Notes (Signed)
PROGRESS NOTE  [name] FFM:[id number] DOB: 1956-11-17 DOA: 05/17/2022 PCP: Redmond School, MD  Brief History:  66 year old male with a history of hypertriglyceridemia and OSA presenting with diarrhea x 2 days.  The patient states that he began having diarrhea on the evening of 05/15/22.  He states that on 05/16/2022 he had about 5-10 bowel movements that were watery.  He denied any fevers, chills, abdominal pain, vomiting.  He did have some nausea.  The patient tried taking some Imodium x2 on 05/16/2022.  He continued to have loose stools.  There is no hematochezia, melena.  He began experiencing some generalized weakness and dizziness.  As result, he presented to the emergency department for further evaluation.  The patient denies any recent antibiotics.  He denies any recent travels or eating any exotic foods.  However, the patient stated that he at at [facility] on the evening of 05/15/2022.  His wife also ate neighbors, but she did not have any diarrheal illness. He is also eating carryout at 2 other establishments in the past week.  Again, his wife who ate at the same establishments did not have any diarrheal illness.  He has not had any sick contacts. In the ED, the patient was afebrile and hypotensive with initial blood pressure of 75/50.  After 1 L of fluid, the patient's blood pressure improved to 94/62.  Oxygen saturation was 100% room air.  WBC 10.9, hemoglobin 17.2, platelets 249,000.  AST 35, ALT 59, alk phosphatase 49, total bilirubin 0.9, albumin 4.1.  Sodium 133, potassium 3.9, bicarbonate 16, serum creatinine 4.91.  The patient was given 1 L normal saline and started on maintenance fluids.  He was admitted for further evaluation and treatment of his AKI.   Assessment/Plan:   * AKI (acute kidney injury) ([place]) Secondary to volume depletion Baseline creatinine 1.0-1.1 -presented with serum creatinine 4.91 Renal ultrasound--neg hydronphrosis increase IV fluids to 125  cc/hr A.m. BMP Certainly, this has been compounded by his concomitant use of Celebrex and olmesartan which have been discontinued  Essential hypertension Discontinue olmesartan in the setting of AKI and hypotension -BP remains controlled  Class 1 obesity BMI 32.39 Lifestyle modification  Hypertriglyceridemia Continue Crestor and fenofibrate  Diarrhea Check Cdiff--neg Stool pathogen panel          Family Communication:  spouse at bedside  Consultants:  none  Code Status:  FULL   DVT Prophylaxis:  [place] Heparin    Procedures: As Listed in Progress Note Above  Antibiotics: None    Subjective: States diarrhea is decreasing in volume and frequency.  Denies f/c, cp, n/v/d abd pain  Objective: Vitals:   05/17/22 2100 05/17/22 2239 05/18/22 0600 05/18/22 1439  BP: 110/81  103/71 111/71  Pulse: 78 78 69 68  Resp: _0 Temp: 98.1 F (36.7 C)  97.9 F (36.6 C) 97.8 F (36.6 C)  TempSrc: Oral  Oral Oral  SpO2: 97% 96% 98% 98%  Weight:      Height:        Intake/Output Summary (Last 24 hours) at 05/18/2022 1646 Last data filed at 05/18/2022 1500 Gross per 24 hour  Intake 2729.17 ml  Output --  Net 2729.17 ml   Weight change:  Exam:  General:  Pt is alert, follows commands appropriately, not in acute distress HEENT: No icterus, No thrush, No neck mass, [place]/AT Cardiovascular: RRR, S1/S2, no rubs, no gallops Respiratory: CTA bilaterally, no wheezing, no  crackles, no rhonchi Abdomen: Soft/+BS, non tender, non distended, no guarding Extremities: No edema, No lymphangitis, No petechiae, No rashes, no synovitis   Data Reviewed: I have personally reviewed following labs and imaging studies Basic Metabolic Panel: Recent Labs  Lab 05/17/22 1022 05/18/22 0611  NA 133* 136  K 3.9 4.6  CL 105 109  CO2 16* 24  GLUCOSE 121* 96  BUN 49* 48*  CREATININE 4.91* 2.76*  CALCIUM 8.7* 8.2*   Liver Function Tests: Recent Labs  Lab 05/17/22 1022  AST 35   ALT 59*  ALKPHOS 49  BILITOT 0.9  PROT 7.6  ALBUMIN 4.1   No results for input(s): "LIPASE", "AMYLASE" in the last 168 hours. No results for input(s): "AMMONIA" in the last 168 hours. Coagulation Profile: No results for input(s): "INR", "PROTIME" in the last 168 hours. CBC: Recent Labs  Lab 05/17/22 1022 05/18/22 0611  WBC 10.9* 8.1  NEUTROABS 7.6  --   HGB 17.2* 15.0  HCT 50.5 45.0  MCV 92.5 94.1  PLT 249 200   Cardiac Enzymes: Recent Labs  Lab 05/17/22 1049  CKTOTAL 42*   BNP: Invalid input(s): "POCBNP" CBG: No results for input(s): "GLUCAP" in the last 168 hours. HbA1C: No results for input(s): "HGBA1C" in the last 72 hours. Urine analysis:    Component Value Date/Time   COLORURINE AMBER (A) 05/17/2022 1023   APPEARANCEUR CLOUDY (A) 05/17/2022 1023   LABSPEC 1.017 05/17/2022 1023   PHURINE 5.0 05/17/2022 1023   GLUCOSEU NEGATIVE 05/17/2022 1023   HGBUR SMALL (A) 05/17/2022 1023   BILIRUBINUR NEGATIVE 05/17/2022 1023   KETONESUR NEGATIVE 05/17/2022 1023   PROTEINUR 100 (A) 05/17/2022 1023   UROBILINOGEN 0.2 10/02/2010 0110   NITRITE NEGATIVE 05/17/2022 1023   LEUKOCYTESUR NEGATIVE 05/17/2022 1023   Sepsis Labs: _0 (procalcitonin:4,lacticidven:4) ) Recent Results (from the past 240 hour(s))  Blood culture (routine x 2)     Status: None (Preliminary result)   Collection Time: 05/17/22 10:28 AM   Specimen: Left Antecubital; Blood  Result Value Ref Range Status   Specimen Description   Final    LEFT ANTECUBITAL BOTTLES DRAWN AEROBIC AND ANAEROBIC   Special Requests Blood Culture adequate volume  Final   Culture   Final    NO GROWTH < 24 HOURS Performed at [facility], [address] [place], [place] [address]    Report Status PENDING  Incomplete  Blood culture (routine x 2)     Status: None (Preliminary result)   Collection Time: 05/17/22 10:28 AM   Specimen: BLOOD RIGHT WRIST  Result Value Ref Range Status   Specimen Description   Final     BLOOD RIGHT WRIST BOTTLES DRAWN AEROBIC AND ANAEROBIC   Special Requests Blood Culture adequate volume  Final   Culture   Final    NO GROWTH < 24 HOURS Performed at [facility], [address], [place], [place] [address]    Report Status PENDING  Incomplete  C Difficile Quick Screen w PCR reflex     Status: None   Collection Time: 05/17/22 10:42 AM   Specimen: STOOL  Result Value Ref Range Status   C Diff antigen NEGATIVE NEGATIVE Final   C Diff toxin NEGATIVE NEGATIVE Final   C Diff interpretation No C. difficile detected.  Final    Comment: Performed at [facility], [address] [place], [place] [address]  SARS Coronavirus 2 by RT PCR (hospital order, performed in [facility] hospital lab) *cepheid single result test* Anterior Nasal Swab  Status: None   Collection Time: 05/17/22 12:56 PM   Specimen: Anterior Nasal Swab  Result Value Ref Range Status   SARS Coronavirus 2 by RT PCR NEGATIVE NEGATIVE Final    Comment: (NOTE) SARS-CoV-2 target nucleic acids are NOT DETECTED.  The SARS-CoV-2 RNA is generally detectable in upper and lower respiratory specimens during the acute phase of infection. The lowest concentration of SARS-CoV-2 viral copies this assay can detect is 250 copies / mL. A negative result does not preclude SARS-CoV-2 infection and should not be used as the sole basis for treatment or other patient management decisions.  A negative result may occur with improper specimen collection / handling, submission of specimen other than nasopharyngeal swab, presence of viral mutation(s) within the areas targeted by this assay, and inadequate number of viral copies (<250 copies / mL). A negative result must be combined with clinical observations, patient history, and epidemiological information.  Fact Sheet for Patients:   https://www.patel.info/  Fact Sheet for Healthcare Providers: https://hall.com/  This test is not yet  approved or  cleared by the [place] FDA and has been authorized for detection and/or diagnosis of SARS-CoV-2 by FDA under an Emergency Use Authorization (EUA).  This EUA will remain in effect (meaning this test can be used) for the duration of the COVID-19 declaration under Section 564(b)(1) of the Act, 21 U.S.C. section 360bbb-3(b)(1), unless the authorization is terminated or revoked sooner.  Performed at [facility], [address], [place], [place] [address]      Scheduled Meds:  fenofibrate  160 mg Oral Daily   heparin  5,000 Units Subcutaneous Q8H   ondansetron (ZOFRAN) IV  4 mg Intravenous Once   rosuvastatin  5 mg Oral Daily   sodium bicarbonate  650 mg Oral Daily   traZODone  50 mg Oral QHS   Continuous Infusions:  sodium chloride 125 mL/hr at 05/18/22 1631    Procedures/Studies: US RENAL  Result Date: 05/17/2022 CLINICAL DATA:  Acute kidney injury EXAM: RENAL / URINARY TRACT ULTRASOUND COMPLETE COMPARISON:  None Available. FINDINGS: Right Kidney: Renal measurements: 14.2 x 8.6 x 7.4 cm = volume: 472 mL. Echogenicity is within normal limits. Circumscribed anechoic cystic lesions measuring 2.7 x 1.9 x 1.8 cm and 1.1 x 0.9 x 1.1 cm consistent with simple cysts. No evidence of hydronephrosis. Left Kidney: Renal measurements: 14.6 x 6.4 x 6.7 cm = volume: 329 mL. Echogenicity within normal limits. Circumscribed anechoic cystic lesions measuring 2.0 x 1.7 x 1.8 cm and 1.6 x 2.5 x 0.9 cm. No hydronephrosis. Bladder: Appears normal for degree of bladder distention. Other: None. IMPRESSION: 1. No evidence of hydronephrosis. 2. Bilateral simple renal cysts. Electronically Signed   By: Jacqulynn Cadet M.D.   On: 05/17/2022 15:31    Orson Eva, DO  [facility]  If 7PM-7AM, please contact night-coverage [email or web address] Password [email or web address] 05/18/2022, 4:46 PM   LOS: 1 day

## 2022-05-19 DIAGNOSIS — A080 Rotaviral enteritis: Secondary | ICD-10-CM | POA: Diagnosis not present

## 2022-05-19 DIAGNOSIS — E669 Obesity, unspecified: Secondary | ICD-10-CM | POA: Diagnosis not present

## 2022-05-19 DIAGNOSIS — N179 Acute kidney failure, unspecified: Secondary | ICD-10-CM | POA: Diagnosis not present

## 2022-05-19 LAB — BASIC METABOLIC PANEL
Anion gap: 3 — ABNORMAL LOW (ref 5–15)
BUN: 33 mg/dL — ABNORMAL HIGH (ref 8–23)
CO2: 24 mmol/L (ref 22–32)
Calcium: 8.4 mg/dL — ABNORMAL LOW (ref 8.9–10.3)
Chloride: 112 mmol/L — ABNORMAL HIGH (ref 98–111)
Creatinine, Ser: 1.56 mg/dL — ABNORMAL HIGH (ref 0.61–1.24)
GFR, Estimated: 49 mL/min — ABNORMAL LOW (ref >60)
Glucose, Bld: 92 mg/dL (ref 70–99)
Potassium: 4.7 mmol/L (ref 3.5–5.1)
Sodium: 139 mmol/L (ref 135–145)

## 2022-05-19 NOTE — Progress Notes (Signed)
Patient discharged home today, transported home by family. Discharge paperwork went over with patient, patient verbalized understanding. Belongings sent home with patient.  ?

## 2022-05-19 NOTE — Assessment & Plan Note (Signed)
Stool pathogen panel>>Rotavirus -still having loose stool but less volume -imodium prn -anticipate self limited course

## 2022-05-19 NOTE — Discharge Summary (Signed)
Physician Discharge Summary   Patient: [name] MRN: [id number] DOB: [birth year]  Admit date:     05/17/2022  Discharge date: 05/19/22  Discharge Physician: Shanon Brow Toua Stites   PCP: Redmond School, MD   Recommendations at discharge:   Please follow up with primary care provider within 1-2 weeks  Please repeat BMP and CBC in one week     Hospital Course: 66 year old male with a history of hypertriglyceridemia and OSA presenting with diarrhea x 2 days.  The patient states that he began having diarrhea on the evening of 05/15/22.  He states that on 05/16/2022 he had about 5-10 bowel movements that were watery.  He denied any fevers, chills, abdominal pain, vomiting.  He did have some nausea.  The patient tried taking some Imodium x2 on 05/16/2022.  He continued to have loose stools.  There is no hematochezia, melena.  He began experiencing some generalized weakness and dizziness.  As result, he presented to the emergency department for further evaluation.  The patient denies any recent antibiotics.  He denies any recent travels or eating any exotic foods.  However, the patient stated that he at at [facility] on the evening of 05/15/2022.  His wife also ate neighbors, but she did not have any diarrheal illness. He is also eating carryout at 2 other establishments in the past week.  Again, his wife who ate at the same establishments did not have any diarrheal illness.  He has not had any sick contacts. In the ED, the patient was afebrile and hypotensive with initial blood pressure of 75/50.  After 1 L of fluid, the patient's blood pressure improved to 94/62.  Oxygen saturation was 100% room air.  WBC 10.9, hemoglobin 17.2, platelets 249,000.  AST 35, ALT 59, alk phosphatase 49, total bilirubin 0.9, albumin 4.1.  Sodium 133, potassium 3.9, bicarbonate 16, serum creatinine 4.91.  The patient was given 1 L normal saline and started on maintenance fluids.  He was admitted for further evaluation and treatment of  his AKI.  Assessment and Plan: * AKI (acute kidney injury) ([place]) Secondary to volume depletion Baseline creatinine 1.0-1.1 -presented with serum creatinine 4.91 Renal ultrasound--neg hydronphrosis increase IV fluids to 125 cc/hr>>improving Certainly, this has been compounded by his concomitant use of Celebrex and olmesartan which have been discontinued -serum creatinine 1.56 on day of d/c  Diarrhea Check Cdiff--neg Stool pathogen panel>>rotavirus -prn imodium  Rotavirus enteritis Stool pathogen panel>>Rotavirus -still having loose stool but less volume -imodium prn -anticipate self limited course  Essential hypertension Discontinue olmesartan in the setting of AKI and hypotension -BP remains controlled  Class 1 obesity BMI 32.39 Lifestyle modification  Hypertriglyceridemia Continue Crestor and fenofibrate         Consultants: none Procedures performed: none  Disposition: Home Diet recommendation:  Cardiac diet DISCHARGE MEDICATION: Allergies as of 05/19/2022       Reactions   Rosuvastatin Other (See Comments)   nausea        Medication List     STOP taking these medications    celecoxib 200 MG capsule Commonly known as: CELEBREX   ibuprofen 200 MG tablet Commonly known as: ADVIL   olmesartan 40 MG tablet Commonly known as: BENICAR       TAKE these medications    fenofibrate 160 MG tablet Take 160 mg by mouth daily.   Fish Oil 1200 MG Caps Take 1,200 mg by mouth daily.   Flaxseed Oil 1200 MG Caps Take 1,200 mg by mouth daily.  rosuvastatin 5 MG tablet Commonly known as: CRESTOR Take 1 tablet (5 mg total) by mouth daily.   sodium bicarbonate 650 MG tablet Take 650 mg by mouth daily.   traZODone 50 MG tablet Commonly known as: DESYREL Take 50 mg by mouth at bedtime.        Discharge Exam: Filed Weights   05/17/22 1012  Weight: 114.4 kg   HEENT:  [place]/AT, No thrush, no icterus CV:  RRR, no rub, no S3, no S4 Lung:  CTA,  no wheeze, no rhonchi Abd:  soft/+BS, NT Ext:  No edema, no lymphangitis, no synovitis, no rash   Condition at discharge: stable  The results of significant diagnostics from this hospitalization (including imaging, microbiology, ancillary and laboratory) are listed below for reference.   Imaging Studies: US RENAL  Result Date: 05/17/2022 CLINICAL DATA:  Acute kidney injury EXAM: RENAL / URINARY TRACT ULTRASOUND COMPLETE COMPARISON:  None Available. FINDINGS: Right Kidney: Renal measurements: 14.2 x 8.6 x 7.4 cm = volume: 472 mL. Echogenicity is within normal limits. Circumscribed anechoic cystic lesions measuring 2.7 x 1.9 x 1.8 cm and 1.1 x 0.9 x 1.1 cm consistent with simple cysts. No evidence of hydronephrosis. Left Kidney: Renal measurements: 14.6 x 6.4 x 6.7 cm = volume: 329 mL. Echogenicity within normal limits. Circumscribed anechoic cystic lesions measuring 2.0 x 1.7 x 1.8 cm and 1.6 x 2.5 x 0.9 cm. No hydronephrosis. Bladder: Appears normal for degree of bladder distention. Other: None. IMPRESSION: 1. No evidence of hydronephrosis. 2. Bilateral simple renal cysts. Electronically Signed   By: Jacqulynn Cadet M.D.   On: 05/17/2022 15:31    Microbiology: Results for orders placed or performed during the hospital encounter of 05/17/22  Blood culture (routine x 2)     Status: None (Preliminary result)   Collection Time: 05/17/22 10:28 AM   Specimen: Left Antecubital; Blood  Result Value Ref Range Status   Specimen Description   Final    LEFT ANTECUBITAL BOTTLES DRAWN AEROBIC AND ANAEROBIC   Special Requests Blood Culture adequate volume  Final   Culture   Final    NO GROWTH 2 DAYS Performed at [facility], [address], [place], [place] [address]    Report Status PENDING  Incomplete  Blood culture (routine x 2)     Status: None (Preliminary result)   Collection Time: 05/17/22 10:28 AM   Specimen: BLOOD RIGHT WRIST  Result Value Ref Range Status   Specimen Description   Final     BLOOD RIGHT WRIST BOTTLES DRAWN AEROBIC AND ANAEROBIC   Special Requests Blood Culture adequate volume  Final   Culture   Final    NO GROWTH 2 DAYS Performed at [facility], [address] [place], [place] [address]    Report Status PENDING  Incomplete  C Difficile Quick Screen w PCR reflex     Status: None   Collection Time: 05/17/22 10:42 AM   Specimen: STOOL  Result Value Ref Range Status   C Diff antigen NEGATIVE NEGATIVE Final   C Diff toxin NEGATIVE NEGATIVE Final   C Diff interpretation No C. difficile detected.  Final    Comment: Performed at [facility], [address] [place], [place] [address]  Gastrointestinal Panel by PCR , Stool     Status: Abnormal   Collection Time: 05/17/22 12:09 PM   Specimen: STOOL  Result Value Ref Range Status   Campylobacter species NOT DETECTED NOT DETECTED Final   Plesimonas shigelloides NOT DETECTED NOT DETECTED  Final   Salmonella species NOT DETECTED NOT DETECTED Final   Yersinia enterocolitica NOT DETECTED NOT DETECTED Final   Vibrio species NOT DETECTED NOT DETECTED Final   Vibrio cholerae NOT DETECTED NOT DETECTED Final   Enteroaggregative E coli (EAEC) NOT DETECTED NOT DETECTED Final   Enteropathogenic E coli (EPEC) NOT DETECTED NOT DETECTED Final   Enterotoxigenic E coli (ETEC) NOT DETECTED NOT DETECTED Final   Shiga like toxin producing E coli (STEC) NOT DETECTED NOT DETECTED Final   Shigella/Enteroinvasive E coli (EIEC) NOT DETECTED NOT DETECTED Final   Cryptosporidium NOT DETECTED NOT DETECTED Final   Cyclospora cayetanensis NOT DETECTED NOT DETECTED Final   Entamoeba histolytica NOT DETECTED NOT DETECTED Final   Giardia lamblia NOT DETECTED NOT DETECTED Final   Adenovirus F40/41 NOT DETECTED NOT DETECTED Final   Astrovirus NOT DETECTED NOT DETECTED Final   Norovirus GI/GII NOT DETECTED NOT DETECTED Final   Rotavirus A DETECTED (A) NOT DETECTED Final   Sapovirus (I, II, IV, and V) NOT DETECTED NOT DETECTED Final     Comment: Performed at [facility], [address] [place], [place] [address]  SARS Coronavirus 2 by RT PCR (hospital order, performed in [place] hospital lab) *cepheid single result test* Anterior Nasal Swab     Status: None   Collection Time: 05/17/22 12:56 PM   Specimen: Anterior Nasal Swab  Result Value Ref Range Status   SARS Coronavirus 2 by RT PCR NEGATIVE NEGATIVE Final    Comment: (NOTE) SARS-CoV-2 target nucleic acids are NOT DETECTED.  The SARS-CoV-2 RNA is generally detectable in upper and lower respiratory specimens during the acute phase of infection. The lowest concentration of SARS-CoV-2 viral copies this assay can detect is 250 copies / mL. A negative result does not preclude SARS-CoV-2 infection and should not be used as the sole basis for treatment or other patient management decisions.  A negative result may occur with improper specimen collection / handling, submission of specimen other than nasopharyngeal swab, presence of viral mutation(s) within the areas targeted by this assay, and inadequate number of viral copies (<250 copies / mL). A negative result must be combined with clinical observations, patient history, and epidemiological information.  Fact Sheet for Patients:   https://www.patel.info/  Fact Sheet for Healthcare Providers: https://hall.com/  This test is not yet approved or  cleared by the [place] FDA and has been authorized for detection and/or diagnosis of SARS-CoV-2 by FDA under an Emergency Use Authorization (EUA).  This EUA will remain in effect (meaning this test can be used) for the duration of the COVID-19 declaration under Section 564(b)(1) of the Act, 21 U.S.C. section 360bbb-3(b)(1), unless the authorization is terminated or revoked sooner.  Performed at [facility], [address], [place], [place] [address]     Labs: CBC: Recent Labs  Lab 05/17/22 1022  05/18/22 0611  WBC 10.9* 8.1  NEUTROABS 7.6  --   HGB 17.2* 15.0  HCT 50.5 45.0  MCV 92.5 94.1  PLT 249 678   Basic Metabolic Panel: Recent Labs  Lab 05/17/22 1022 05/18/22 0611 05/19/22 0604  NA 133* 136 139  K 3.9 4.6 4.7  CL 105 109 112*  CO2 16* 24 24  GLUCOSE 121* 96 92  BUN 49* 48* 33*  CREATININE 4.91* 2.76* 1.56*  CALCIUM 8.7* 8.2* 8.4*   Liver Function Tests: Recent Labs  Lab 05/17/22 1022  AST 35  ALT 59*  ALKPHOS 49  BILITOT 0.9  PROT 7.6  ALBUMIN  4.1   CBG: No results for input(s): "GLUCAP" in the last 168 hours.  Discharge time spent: greater than 30 minutes.  Signed: Orson Eva, MD [facility] 05/19/2022

## 2022-05-22 LAB — CULTURE, BLOOD (ROUTINE X 2)
Culture: NO GROWTH
Culture: NO GROWTH
Special Requests: ADEQUATE
Special Requests: ADEQUATE

## 2022-05-29 DIAGNOSIS — E785 Hyperlipidemia, unspecified: Secondary | ICD-10-CM | POA: Diagnosis not present

## 2022-05-30 LAB — NMR, LIPOPROFILE
Cholesterol, Total: 130 mg/dL (ref 100–199)
HDL Particle Number: 30.3 umol/L — ABNORMAL LOW (ref >=30.5)
HDL-C: 48 mg/dL (ref >39)
LDL Particle Number: 759 nmol/L (ref <1000)
LDL Size: 20.6 nm (ref >20.5)
LDL-C (NIH Calc): 62 mg/dL (ref 0–99)
LP-IR Score: 49 — ABNORMAL HIGH (ref <=45)
Small LDL Particle Number: 367 nmol/L (ref <=527)
Triglycerides: 111 mg/dL (ref 0–149)

## 2022-06-07 ENCOUNTER — Ambulatory Visit: Payer: Medicare Other | Admitting: Internal Medicine

## 2022-06-14 ENCOUNTER — Encounter: Payer: Self-pay | Admitting: Internal Medicine

## 2022-07-04 ENCOUNTER — Other Ambulatory Visit: Payer: Self-pay | Admitting: *Deleted

## 2022-07-04 ENCOUNTER — Encounter: Payer: Self-pay | Admitting: *Deleted

## 2022-07-04 NOTE — Patient Instructions (Signed)
Visit Information  Thank you for taking time to visit with me today. Please don't hesitate to contact me if I can be of assistance to you before our next scheduled telephone appointment.  Please call the [facility]: [phone] call [phone] if you are experiencing a Mental Health or [place] or need someone to talk to.  Patient verbalizes understanding of instructions and care plan provided today and agrees to view in [place]. Active MyChart status and patient understanding of how to access instructions and care plan via MyChart confirmed with patient.     The patient has been provided with contact information for the care management team and has been advised to call with any health related questions or concerns.   Valente David, RN, MSN, [facility] Care Coordinator [phone]

## 2022-07-04 NOTE — Patient Outreach (Signed)
  Care Coordination   Initial Visit Note   07/04/2022 Name: [name] MRN: [id number] DOB: Apr 21, 1956  [name] is a 66 y.o. year old male who sees Redmond School, MD for primary care. I spoke with  Cydney Ok by phone today  What matters to the patients health and wellness today?  Member report overall, what matters is staying healthy to "keep my wife happy."  Unsure when his last AWV was, encouraged to schedule.  Monitors BP on a daily basis, range 120-130/70s.    Goals Addressed               This Visit's Progress     Stay active and maintain health (pt-stated)        Care Coordination Interventions: Advised patient to consider water aerobics in effort to reduce joint pain when exercising Reviewed medications with patient and discussed contacting RNCM if medication assistance is needed Reviewed scheduled/upcoming provider appointments including need for AWV Assessed social determinant of health barriers         SDOH assessments and interventions completed:  Yes  SDOH Interventions Today    Flowsheet Row Most Recent Value  SDOH Interventions   Food Insecurity Interventions Intervention Not Indicated  Financial Strain Interventions Intervention Not Indicated  Housing Interventions Intervention Not Indicated  Transportation Interventions Intervention Not Indicated        Care Coordination Interventions Activated:  Yes  Care Coordination Interventions:  Yes, provided   Follow up plan: No further intervention required.   Encounter Outcome:  Pt. Visit Completed   Valente David, RN, MSN, [facility] Care Coordinator [phone]

## 2022-08-20 DIAGNOSIS — J3089 Other allergic rhinitis: Secondary | ICD-10-CM | POA: Diagnosis not present

## 2022-08-20 DIAGNOSIS — M5412 Radiculopathy, cervical region: Secondary | ICD-10-CM | POA: Diagnosis not present

## 2022-08-20 DIAGNOSIS — I10 Essential (primary) hypertension: Secondary | ICD-10-CM | POA: Diagnosis not present

## 2022-08-20 DIAGNOSIS — M47812 Spondylosis without myelopathy or radiculopathy, cervical region: Secondary | ICD-10-CM | POA: Diagnosis not present

## 2022-08-31 ENCOUNTER — Encounter (HOSPITAL_COMMUNITY): Payer: Self-pay | Admitting: *Deleted

## 2022-08-31 ENCOUNTER — Emergency Department (HOSPITAL_COMMUNITY)
Admission: EM | Admit: 2022-08-31 | Discharge: 2022-08-31 | Disposition: A | Discharge status: Home / Self Care | Payer: Medicare Other | Attending: Emergency Medicine | Admitting: Emergency Medicine

## 2022-08-31 ENCOUNTER — Other Ambulatory Visit: Payer: Self-pay

## 2022-08-31 DIAGNOSIS — M79604 Pain in right leg: Secondary | ICD-10-CM | POA: Diagnosis not present

## 2022-08-31 DIAGNOSIS — X58XXXA Exposure to other specified factors, initial encounter: Secondary | ICD-10-CM | POA: Diagnosis not present

## 2022-08-31 DIAGNOSIS — T148XXA Other injury of unspecified body region, initial encounter: Secondary | ICD-10-CM

## 2022-08-31 DIAGNOSIS — S8991XA Unspecified injury of right lower leg, initial encounter: Secondary | ICD-10-CM | POA: Diagnosis present

## 2022-08-31 DIAGNOSIS — S86911A Strain of unspecified muscle(s) and tendon(s) at lower leg level, right leg, initial encounter: Secondary | ICD-10-CM | POA: Insufficient documentation

## 2022-08-31 MED ORDER — HYDROCODONE-ACETAMINOPHEN 5-325 MG PO TABS: 1.0000 | ORAL_TABLET | ORAL | 0 refills | Status: DC | PRN

## 2022-08-31 MED ORDER — MELOXICAM 15 MG PO TABS: 15.0000 mg | ORAL_TABLET | Freq: Every day | ORAL | 0 refills | Status: AC

## 2022-08-31 MED ORDER — DEXAMETHASONE SODIUM PHOSPHATE 10 MG/ML IJ SOLN
12.0000 mg | Freq: Once | INTRAMUSCULAR | Status: AC
  Administered 2022-08-31: 12 mg via INTRAMUSCULAR
  Filled 2022-08-31: qty 2

## 2022-08-31 MED ORDER — HYDROCODONE-ACETAMINOPHEN 5-325 MG PO TABS
1.0000 | ORAL_TABLET | Freq: Once | ORAL | Status: AC
  Administered 2022-08-31: 1 via ORAL
  Filled 2022-08-31: qty 1

## 2022-08-31 NOTE — ED Provider Notes (Signed)
[facility] EMERGENCY DEPARTMENT Provider Note   CSN: [id number] Arrival date & time: 08/31/22  2135     History  Chief Complaint  Patient presents with   Leg Pain    [name] is a 66 y.o. male.  Patient presents to the hospital complaining of right-sided leg pain.  Patient states he was helping a friend change a tire on a camper yesterday and had to lay onto the table with his leg position for approximately 20 to 30 minutes.  He states that he had pain when he was finished with his and was unable to get comfortable for the rest of the way home.  He states his pain is continued since that time.  He describes pain as being in the posterior right thigh just above the knee.  The patient denies shortness of breath, chest pain.  Past medical history is significant for end-stage arthritis of bilateral knees, cervical radiculopathy, rupture of distal biceps tendon, dyspnea on exertion  HPI     Home Medications Prior to Admission medications   Medication Sig Start Date End Date Taking? Authorizing Provider  HYDROcodone-acetaminophen (NORCO/VICODIN) 5-325 MG tablet Take 1 tablet by mouth every 4 (four) hours as needed. 08/31/22  Yes Dorothyann Peng, PA-C  meloxicam (MOBIC) 15 MG tablet Take 1 tablet (15 mg total) by mouth daily. 08/31/22 09/30/22 Yes Dorothyann Peng, PA-C  fenofibrate 160 MG tablet Take 160 mg by mouth daily.     [provider]  Flaxseed, Linseed, (FLAXSEED OIL) 1200 MG CAPS Take 1,200 mg by mouth daily.    [provider]  Omega-3 Fatty Acids (FISH OIL) 1200 MG CAPS Take 1,200 mg by mouth daily.    [provider]  rosuvastatin (CRESTOR) 5 MG tablet Take 1 tablet (5 mg total) by mouth daily. 02/19/22 05/20/22  Pixie Casino, MD  sodium bicarbonate 650 MG tablet Take 650 mg by mouth daily. 12/06/21   [provider]  traZODone (DESYREL) 50 MG tablet Take 50 mg by mouth at bedtime.    [provider]      Allergies     Patient has no known allergies.    Review of Systems   Review of Systems  Respiratory:  Negative for shortness of breath.   Cardiovascular:  Negative for chest pain.  Musculoskeletal:  Positive for myalgias. Negative for back pain and joint swelling.  Neurological:  Negative for headaches.    Physical Exam Updated Vital Signs BP (!) 159/96 (BP Location: Right Arm)   Pulse 79   Temp 98 F (36.7 C) (Oral)   Resp 16   Ht '6\' 2"'$  (1.88 m)   Wt 115.7 kg   SpO2 97%   BMI 32.74 kg/m  Physical Exam HENT:     Head: Normocephalic and atraumatic.  Eyes:     Conjunctiva/sclera: Conjunctivae normal.  Cardiovascular:     Rate and Rhythm: Normal rate.  Pulmonary:     Effort: Pulmonary effort is normal. No respiratory distress.  Musculoskeletal:        General: Tenderness (Tenderness to posterior right thigh just superior to popliteal fossa) present. No swelling, deformity or signs of injury. Normal range of motion.     Cervical back: Normal range of motion.  Skin:    General: Skin is dry.  Neurological:     Mental Status: He is alert.  Psychiatric:        Speech: Speech normal.        Behavior: Behavior normal.  ED Results / Procedures / Treatments   Labs (all labs ordered are listed, but only abnormal results are displayed) Labs Reviewed - No data to display  EKG None  Radiology No results found.  Procedures Procedures    Medications Ordered in ED Medications  HYDROcodone-acetaminophen (NORCO/VICODIN) 5-325 MG per tablet 1 tablet (has no administration in time range)  dexamethasone (DECADRON) injection 12 mg (has no administration in time range)    ED Course/ Medical Decision Making/ A&P                           Medical Decision Making Risk Prescription drug management.   Patient presents to the hospital complaining of right sided leg pain. Differential diagnosis includes muscle strain, radicular pain, fracture, dislocation, DVT, Baker's cyst, and  others  I reviewed the patient's past medical history which includes multiple orthopedic visits for end-stage bilateral knee osteoarthritis and cervical radiculopathy  There is no indication at this time for lab work or imaging.  I ordered the patient hydrocodone and decadron for pain and inflammation.  Upon reassessment the patient was feeling somewhat better.  The patient denies any injury or trauma to the leg.  I see no need for imaging as he is able to move the leg freely with no signs of fracture or dislocation.  No swelling was noted to suggest a Baker's cyst.  Negative Homans' sign.  The patient has no shortness of breath.  Very low clinical suspicion of a DVT.  Patient does have pain with knee flexion and some tenderness to the hamstring muscles at the distal region.  I feel this is most likely a hamstring strain. Plan to discharge patient home on meloxicam and a short course of pain medication. Patient will follow up with his orthopedic provider on Monday. If he develops shortness of breath or other life threatening conditions he has been advised to return to the emergency department.         Final Clinical Impression(s) / ED Diagnoses Final diagnoses:  Muscle strain  Right leg pain    Rx / DC Orders ED Discharge Orders          Ordered    meloxicam (MOBIC) 15 MG tablet  Daily        08/31/22 2246    HYDROcodone-acetaminophen (NORCO/VICODIN) 5-325 MG tablet  Every 4 hours PRN        08/31/22 2246              Ronny Bacon 08/31/22 2247    Hayden Rasmussen, MD 09/01/22 1046

## 2022-08-31 NOTE — ED Notes (Signed)
Went over [employer] with pt and wife. All questions answered. Wheeled out to lobby.

## 2022-08-31 NOTE — Discharge Instructions (Signed)
You were seen today for what appears to be a muscle strain. I have prescribed Meloxicam which is an antiinflammatory. Do not take other NSAID medications such as celebrex or ibuprofen while taking this medication. I have also prescribed hydrocodone to be used for pain. You may take over the counter Tylenol (acetaminophen) for pain as well. Do not exceed '4000mg'$  acetaminophen from all sources, including the prescription pain medication, in a 24 hour period. If you develop shortness of breath or other life threatening conditions please return to the emergency department. Otherwise, I recommend follow up with your orthopedic providers for further evaluation and management.

## 2022-08-31 NOTE — ED Triage Notes (Signed)
Pt with severe right leg pain since yesterday, [place]30 [place].  Pt states he did help a friend change a tire earlier yesterday.  Pt denies any redness or feeling hot.

## 2022-09-24 DIAGNOSIS — M170 Bilateral primary osteoarthritis of knee: Secondary | ICD-10-CM | POA: Diagnosis not present

## 2022-09-24 DIAGNOSIS — M1711 Unilateral primary osteoarthritis, right knee: Secondary | ICD-10-CM | POA: Diagnosis not present

## 2022-09-24 DIAGNOSIS — M1712 Unilateral primary osteoarthritis, left knee: Secondary | ICD-10-CM | POA: Diagnosis not present

## 2022-10-03 ENCOUNTER — Ambulatory Visit: Payer: BC Managed Care – PPO | Attending: Internal Medicine | Admitting: Internal Medicine

## 2022-10-03 ENCOUNTER — Encounter: Payer: Self-pay | Admitting: Internal Medicine

## 2022-10-03 VITALS — BP 115/76 | HR 59 | Ht 74.0 in | Wt 253.6 lb

## 2022-10-03 DIAGNOSIS — R079 Chest pain, unspecified: Secondary | ICD-10-CM

## 2022-10-03 DIAGNOSIS — I351 Nonrheumatic aortic (valve) insufficiency: Secondary | ICD-10-CM | POA: Diagnosis not present

## 2022-10-03 DIAGNOSIS — R5383 Other fatigue: Secondary | ICD-10-CM

## 2022-10-03 DIAGNOSIS — Z8249 Family history of ischemic heart disease and other diseases of the circulatory system: Secondary | ICD-10-CM | POA: Diagnosis not present

## 2022-10-03 DIAGNOSIS — I77810 Thoracic aortic ectasia: Secondary | ICD-10-CM | POA: Diagnosis not present

## 2022-10-03 DIAGNOSIS — R931 Abnormal findings on diagnostic imaging of heart and coronary circulation: Secondary | ICD-10-CM | POA: Diagnosis not present

## 2022-10-03 DIAGNOSIS — R0609 Other forms of dyspnea: Secondary | ICD-10-CM | POA: Diagnosis not present

## 2022-10-03 NOTE — Progress Notes (Signed)
OFFICE NOTE  Chief Complaint:  Follow-up  Primary Care Physician: Redmond School, MD  HPI:  [name] is a 66 y.o. male who had seen Dr. Johnsie Cancel, with a past medial history significant for calcium scoring which was significantly abnormal showing a high calcium score of 2925, 98th percentile for age and sex matched controls.  He also had had exercise treadmill testing and an echocardiogram just prior to that as well which were only mildly abnormal.  He does have a history of dyslipidemia with very high triglycerides in the past but has been reasonably controlled on fenofibrate.  He was started on rosuvastatin 10 mg daily but he said he took it for 4 days and was nauseated every day.  He then stopped it and his symptoms resolved.  He was camping at the time and conceded that it may have been some other reason why he was nauseated.  He then discarded all the medication.  He is now referred for recommendations for further therapy.  His lipid profile in November 2022 showed total cholesterol 164, triglycerides 181, HDL 35 and LDL of 93.  His target LDL should be less than 70.  He also was noted to have some abnormal liver enzymes and based on ultrasound of the abdomen in 2019 does have hepatic steatosis.  Recent AST and ALT were 51 and 84 respectively.  He says he has been working hard on his diet but understands the importance of medication therapy given his high calcium score.  10/03/2022  [name] returns today for follow-up of his lipids.  He has had significant further improvement in his cholesterol.  His LDL particle numbers now 759, 8 LDL-C of 62, triglycerides are normal at 111.  Overall he seems to be doing well on his therapies.  What is concerning however is he has had worsening shortness of breath with exertion.  He has had decreased exercise intolerance and fatigue.  He reported his wife and daughter were quite concerned about him.  He says he has a 13-year-old grandchild and has to  stop and catch his breath often when doing normal activities or running around with him.  EKG shows a sinus bradycardia today with a right bundle branch block and 59.  As mentioned he had undergone an exercise tolerance test in October 2022 which was negative.  He had an echo as well which showed mild aortic insufficiency and a dilated aortic root to 43 mm.Marland Kitchen  PMHx:  Past Medical History:  Diagnosis Date   History of kidney stones    Hypertriglyceridemia    OSA on CPAP    POST  SLEEP APNEA SURGERY --- PER SLEEP STUDY 06-26-15-2006  MODERATE OSA   USES CPAP DAILY   Renal disorder    Scrotal swelling    Shortness of breath dyspnea    with exertion   Urgency of urination    OCCASIONAL    Past Surgical History:  Procedure Laterality Date   APPENDECTOMY     CHOLECYSTECTOMY N/A 08/07/2018   Procedure: LAPAROSCOPIC CHOLECYSTECTOMY;  Surgeon: Aviva Signs, MD;  [facility];  Service: General;  Laterality: N/A;   COLONOSCOPY     COLONOSCOPY N/A 06/05/2018   Procedure: COLONOSCOPY;  Surgeon: Daneil Dolin, MD;  [facility];  Service: Endoscopy;  Laterality: N/A;  7:30   DISTAL BICEPS TENDON REPAIR Right 01/30/2015   Procedure: DISTAL BICEPS TENDON REPAIR;  Surgeon: Marybelle Killings, MD;  Location: [place];  Service: Orthopedics;  Laterality:  Right;   EXPLORATION LAPAROTOMY/  PARTIAL COLECTOMY WITH RESECTION TERMINAL ILEUM (APPENDICEAL MASS)  01-16-2010   BENIGN NEOPLASM APPENDIX   HYDROCELE EXCISION Right 03/18/2014   Procedure:  RIGHT SPERMACELECTOMY;  Surgeon: Claybon Jabs, MD;  Location: [facility];  Service: Urology;  Laterality: Right;   KNEE ARTHROSCOPY Right 2010   LITHOTRIPSY     LIVER BIOPSY  08/07/2018   Procedure: LIVER BIOPSY;  Surgeon: Aviva Signs, MD;  [facility];  Service: General;;   POLYPECTOMY  06/05/2018   Procedure: POLYPECTOMY;  Surgeon: Daneil Dolin, MD;  [facility];  Service: Endoscopy;;  ascending and descending    TONSILLECTOMY     UVULOPALATOPHARYNGOPLASTY  2000 (APPROX)   W/  DEVIATED SEPTUM REPAIR  AND TONSILLECTOMY    FAMHx:  No family history on file.  SOCHx:   reports that he has never smoked. He has never used smokeless tobacco. He reports that he does not currently use alcohol. He reports that he does not use drugs.  ALLERGIES:  No Known Allergies  ROS: Pertinent items noted in HPI and remainder of comprehensive ROS otherwise negative.  HOME MEDS: Current Outpatient Medications on File Prior to Visit  Medication Sig Dispense Refill   celecoxib (CELEBREX) 200 MG capsule Take 200 mg by mouth 2 (two) times daily as needed.     fenofibrate 160 MG tablet Take 160 mg by mouth daily.      olmesartan (BENICAR) 40 MG tablet Take 40 mg by mouth daily.     Omega-3 Fatty Acids (FISH OIL) 1200 MG CAPS Take 1,200 mg by mouth daily.     traZODone (DESYREL) 50 MG tablet Take 50 mg by mouth at bedtime.     rosuvastatin (CRESTOR) 5 MG tablet Take 1 tablet (5 mg total) by mouth daily. 90 tablet 3   No current facility-administered medications on file prior to visit.    LABS/IMAGING: No results found for this or any previous visit (from the past 48 hour(s)). No results found.  LIPID PANEL:    Component Value Date/Time   CHOL 164 09/19/2021 0818   TRIG 181 (H) 09/19/2021 0818   HDL 35 (L) 09/19/2021 0818   CHOLHDL 4.7 09/19/2021 0818   VLDL 36 09/19/2021 0818   LDLCALC 93 09/19/2021 0818     WEIGHTS: Wt Readings from Last 3 Encounters:  10/03/22 253 lb 9.6 oz (115 kg)  08/31/22 255 lb (115.7 kg)  05/17/22 252 lb 4 oz (114.4 kg)    VITALS: BP 115/76 (BP Location: Left Arm, Patient Position: Sitting)   Pulse (!) 59   Ht '6\' 2"'$  (1.88 m)   Wt 253 lb 9.6 oz (115 kg)   SpO2 97%   BMI 32.56 kg/m   EXAM: General appearance: alert and no distress Neck: no carotid bruit and no JVD Lungs: clear to auscultation bilaterally Heart: regular rate and rhythm, S1, S2 normal, no murmur, click,  rub or gallop Abdomen: soft, non-tender; bowel sounds normal; no masses,  no organomegaly Extremities: extremities normal, atraumatic, no cyanosis or edema Pulses: 2+ and symmetric Skin: Skin color, texture, turgor normal. No rashes or lesions Neurologic: Grossly normal Psych: Pleasant  EKG: Sinus bradycardia 59, RBBB- personally reviewed  ASSESSMENT: Progressive fatigue, dyspnea on exertion Mixed dyslipidemia, goal LDL less than 70 High CAC score of 2925, 98th percentile for age and sex matched controls History of high triglycerides Family history of heart disease in both parents  PLAN: 1.   [name] has  had progressive fatigue and dyspnea on exertion with exercise intolerance.  He had a high coronary calcium score and had a negative treadmill test last year.  He also had some mild aortic insufficiency with a dilated aorta.  I am concerned that he has had progressive coronary disease and could have obstructive coronary disease based on his worsening symptoms.  He has denied any angina however he has had worsening fatigue and dyspnea.  His cholesterol appears to be well controlled at this point.  I would like for him to undergo CT coronary angiography.  There is a high likelihood that this may be abnormal and he may need to go on to cardiac catheterization.  I will reach out to him with the results of his study and he can follow-up with me or Dr. Johnsie Cancel afterwards.  Pixie Casino, MD, [facility], [place] Director of the Advanced Lipid Disorders &  Cardiovascular Risk Reduction Clinic Diplomate of the American Board of Clinical Lipidology Attending Cardiologist  Direct Dial: [phone]  Fax: [phone]  Website:  [email or web address] Psalm Arman 10/03/2022, 10:29 PM

## 2022-10-03 NOTE — Patient Instructions (Signed)
Medication Instructions:  NO CHANGES  *If you need a refill on your cardiac medications before your next appointment, please call your pharmacy*   Lab Work: Non-Fasting BMET -- prior to CT test  If you have labs (blood work) drawn today and your tests are completely normal, you will receive your results only by: [place] (if you have MyChart) OR A paper copy in the mail If you have any lab test that is abnormal or we need to change your treatment, we will call you to review the results.   Testing/Procedures: Echocardiogram at [facility]. Echocardiography is a painless test that uses sound waves to create images of your heart. It provides your doctor with information about the size and shape of your heart and how well your heart's chambers and valves are working. This procedure takes approximately one hour. There are no restrictions for this procedure. Please do NOT wear cologne, perfume, aftershave, or lotions (deodorant is allowed). Please arrive 15 minutes prior to your appointment time.  Coronary CT at [facility] will be called to schedule this once approved with insurance.     Follow-Up: At [facility], you and your health needs are our priority.  As part of our continuing mission to provide you with exceptional heart care, we have created designated Provider Care Teams.  These Care Teams include your primary Cardiologist (physician) and Advanced Practice Providers (APPs -  Physician Assistants and Nurse Practitioners) who all work together to provide you with the care you need, when you need it.  We recommend signing up for the patient portal called "MyChart".  Sign up information is provided on this After Visit Summary.  MyChart is used to connect with patients for Virtual Visits (Telemedicine).  Patients are able to view lab/test results, encounter notes, upcoming appointments, etc.  Non-urgent messages can be sent to your provider as well.   To learn more  about what you can do with MyChart, go to [email or web address].    Your next appointment:    6-8 weeks with Dr. Debara Hickman  Other Instructions    Your cardiac CT will be scheduled at one of the below locations:   [facility] [address] [place], [place] [address] [phone]  [facility] [address] [place], [place] [address] [phone]  [facility] [place],  [address] [phone]  If scheduled at [facility], please arrive at the [facility] and [facility]'s Entrance (Entrance C2) of [facility] 30 minutes prior to test start time. You can use the FREE valet parking offered at entrance C (encouraged to control the heart rate for the test)  Proceed to the [facility] Radiology Department (first floor) to check-in and test prep.  All radiology patients and guests should use entrance C2 at [facility], accessed from [facility], even though the hospital's physical address listed is [address]    If scheduled at [facility] or [facility], please arrive 15 mins early for check-in and test prep.   Please follow these instructions carefully (unless otherwise directed):  Hold all erectile dysfunction medications at least 3 days (72 hrs) prior to test. (Ie viagra, cialis, sildenafil, tadalafil, etc) We will administer nitroglycerin during this exam.   On the Night Before the Test: Be sure to Drink plenty of water. Do not consume any caffeinated/decaffeinated beverages or chocolate  12 hours prior to your test. Do not take any antihistamines 12 hours prior to your test.  On the Day of the Test: Drink plenty of water until 1 hour prior to the test. Do not eat any food 1 hour prior to test. You may take your regular medications prior to the test.  Take metoprolol  (Lopressor) two hours prior to test. HOLD Furosemide/Hydrochlorothiazide morning of the test. FEMALES- please wear underwire-free bra if available, avoid dresses & tight clothing  After the Test: Drink plenty of water. After receiving IV contrast, you may experience a mild flushed feeling. This is normal. On occasion, you may experience a mild rash up to 24 hours after the test. This is not dangerous. If this occurs, you can take Benadryl 25 mg and increase your fluid intake. If you experience trouble breathing, this can be serious. If it is severe call 911 IMMEDIATELY. If it is mild, please call our office.  We will call to schedule your test 2-4 weeks out understanding that some insurance companies will need an authorization prior to the service being performed.   For non-scheduling related questions, please contact the cardiac imaging nurse navigator should you have any questions/concerns: [name], Cardiac Imaging Nurse Navigator [name], Cardiac Imaging Nurse Navigator [facility] Direct Office Dial: [phone]   For scheduling needs, including cancellations and rescheduling, please call [name], [phone].

## 2022-10-04 ENCOUNTER — Ambulatory Visit (HOSPITAL_COMMUNITY)
Admission: RE | Admit: 2022-10-04 | Discharge: 2022-10-04 | Disposition: A | Discharge status: Home / Self Care | Payer: Medicare Other | Source: Ambulatory Visit | Attending: Internal Medicine | Admitting: Internal Medicine

## 2022-10-04 DIAGNOSIS — Z8249 Family history of ischemic heart disease and other diseases of the circulatory system: Secondary | ICD-10-CM | POA: Insufficient documentation

## 2022-10-04 DIAGNOSIS — R0609 Other forms of dyspnea: Secondary | ICD-10-CM | POA: Diagnosis not present

## 2022-10-04 DIAGNOSIS — R5383 Other fatigue: Secondary | ICD-10-CM | POA: Diagnosis not present

## 2022-10-04 DIAGNOSIS — I351 Nonrheumatic aortic (valve) insufficiency: Secondary | ICD-10-CM | POA: Diagnosis not present

## 2022-10-04 DIAGNOSIS — I77810 Thoracic aortic ectasia: Secondary | ICD-10-CM | POA: Diagnosis not present

## 2022-10-04 DIAGNOSIS — R079 Chest pain, unspecified: Secondary | ICD-10-CM | POA: Diagnosis not present

## 2022-10-04 DIAGNOSIS — R931 Abnormal findings on diagnostic imaging of heart and coronary circulation: Secondary | ICD-10-CM | POA: Insufficient documentation

## 2022-10-04 LAB — BASIC METABOLIC PANEL
BUN/Creatinine Ratio: 15 (ref 10–24)
BUN: 18 mg/dL (ref 8–27)
CO2: 26 mmol/L (ref 20–29)
Calcium: 10.1 mg/dL (ref 8.6–10.2)
Chloride: 104 mmol/L (ref 96–106)
Creatinine, Ser: 1.23 mg/dL (ref 0.76–1.27)
Glucose: 71 mg/dL (ref 70–99)
Potassium: 4.5 mmol/L (ref 3.5–5.2)
Sodium: 143 mmol/L (ref 134–144)
eGFR: 65 mL/min/{1.73_m2} (ref >59)

## 2022-10-04 LAB — ECHOCARDIOGRAM COMPLETE
AR max vel: 3.26 cm2
AV Area VTI: 2.97 cm2
AV Area mean vel: 2.84 cm2
AV Mean grad: 4.9 mmHg
AV Peak grad: 8.1 mmHg
Ao pk vel: 1.42 m/s
Area-P 1/2: 2.76 cm2
S' Lateral: 3 cm

## 2022-10-04 NOTE — Progress Notes (Signed)
*  PRELIMINARY RESULTS* Echocardiogram 2D Echocardiogram has been performed.  [name] 10/04/2022, 11:18 AM

## 2022-10-16 ENCOUNTER — Telehealth (HOSPITAL_COMMUNITY): Payer: Self-pay | Admitting: Emergency Medicine

## 2022-10-16 NOTE — Telephone Encounter (Signed)
Attempted to call patient regarding upcoming cardiac CT appointment. °Left message on voicemail with name and callback number °Somalia Segler RN Navigator Cardiac Imaging °[facility] °[phone] Office °[phone] Cell ° °

## 2022-10-16 NOTE — Telephone Encounter (Signed)
Reaching out to patient to offer assistance regarding upcoming cardiac imaging study; pt verbalizes understanding of appt date/time, parking situation and where to check in, pre-test NPO status and medications ordered, and verified current allergies; name and call back number provided for further questions should they arise Jimmy Bond RN Navigator Cardiac Imaging [facility] [phone] office [phone] cell  Arrival 1230  Daily meds Denies IV issues Aware contrast and nitro

## 2022-10-17 ENCOUNTER — Ambulatory Visit (HOSPITAL_COMMUNITY)
Admission: RE | Admit: 2022-10-17 | Discharge: 2022-10-17 | Disposition: A | Discharge status: Home / Self Care | Payer: Medicare Other | Source: Ambulatory Visit | Attending: Internal Medicine | Admitting: Internal Medicine

## 2022-10-17 DIAGNOSIS — R0609 Other forms of dyspnea: Secondary | ICD-10-CM | POA: Diagnosis not present

## 2022-10-17 DIAGNOSIS — I2510 Atherosclerotic heart disease of native coronary artery without angina pectoris: Secondary | ICD-10-CM | POA: Diagnosis not present

## 2022-10-17 DIAGNOSIS — R931 Abnormal findings on diagnostic imaging of heart and coronary circulation: Secondary | ICD-10-CM

## 2022-10-17 DIAGNOSIS — I351 Nonrheumatic aortic (valve) insufficiency: Secondary | ICD-10-CM | POA: Diagnosis not present

## 2022-10-17 DIAGNOSIS — Z8249 Family history of ischemic heart disease and other diseases of the circulatory system: Secondary | ICD-10-CM

## 2022-10-17 DIAGNOSIS — R079 Chest pain, unspecified: Secondary | ICD-10-CM

## 2022-10-17 DIAGNOSIS — I77810 Thoracic aortic ectasia: Secondary | ICD-10-CM | POA: Diagnosis not present

## 2022-10-17 DIAGNOSIS — R5383 Other fatigue: Secondary | ICD-10-CM

## 2022-10-17 MED ORDER — NITROGLYCERIN 0.4 MG SL SUBL
0.8000 mg | SUBLINGUAL_TABLET | Freq: Once | SUBLINGUAL | Status: AC
  Administered 2022-10-17: 0.8 mg via SUBLINGUAL

## 2022-10-17 MED ORDER — IOHEXOL 350 MG/ML SOLN
100.0000 mL | Freq: Once | INTRAVENOUS | Status: AC | PRN
  Administered 2022-10-17: 100 mL via INTRAVENOUS

## 2022-10-17 MED ORDER — NITROGLYCERIN 0.4 MG SL SUBL
SUBLINGUAL_TABLET | SUBLINGUAL | Status: AC
  Filled 2022-10-17: qty 2

## 2022-10-18 ENCOUNTER — Ambulatory Visit (HOSPITAL_COMMUNITY)
Admission: RE | Admit: 2022-10-18 | Discharge: 2022-10-18 | Disposition: A | Discharge status: Home / Self Care | Payer: Medicare Other | Source: Ambulatory Visit | Attending: Cardiology | Admitting: Cardiology

## 2022-10-18 ENCOUNTER — Other Ambulatory Visit (HOSPITAL_COMMUNITY): Payer: Self-pay | Admitting: Emergency Medicine

## 2022-10-18 DIAGNOSIS — R931 Abnormal findings on diagnostic imaging of heart and coronary circulation: Secondary | ICD-10-CM | POA: Insufficient documentation

## 2022-10-18 DIAGNOSIS — I2510 Atherosclerotic heart disease of native coronary artery without angina pectoris: Secondary | ICD-10-CM | POA: Diagnosis not present

## 2022-10-21 ENCOUNTER — Encounter: Payer: Self-pay | Admitting: Internal Medicine

## 2022-10-21 ENCOUNTER — Ambulatory Visit: Payer: Medicare Other | Attending: Internal Medicine | Admitting: Internal Medicine

## 2022-10-21 VITALS — BP 118/72 | HR 81 | Ht 74.0 in | Wt 249.0 lb

## 2022-10-21 DIAGNOSIS — I77810 Thoracic aortic ectasia: Secondary | ICD-10-CM

## 2022-10-21 DIAGNOSIS — E785 Hyperlipidemia, unspecified: Secondary | ICD-10-CM | POA: Diagnosis not present

## 2022-10-21 DIAGNOSIS — R931 Abnormal findings on diagnostic imaging of heart and coronary circulation: Secondary | ICD-10-CM

## 2022-10-21 DIAGNOSIS — R0609 Other forms of dyspnea: Secondary | ICD-10-CM

## 2022-10-21 MED ORDER — ASPIRIN 81 MG PO TBEC: 81.0000 mg | DELAYED_RELEASE_TABLET | Freq: Every day | ORAL | 3 refills | Status: DC

## 2022-10-21 NOTE — Patient Instructions (Signed)
Medication Instructions:  START: ASPIRIN '81mg'$  ONCE DAILY  *If you need a refill on your cardiac medications before your next appointment, please call your pharmacy*  Lab Work: None Ordered At This Time.  If you have labs (blood work) drawn today and your tests are completely normal, you will receive your results only by: [place] (if you have MyChart) OR A paper copy in the mail If you have any lab test that is abnormal or we need to change your treatment, we will call you to review the results.  Testing/Procedures: None Ordered At This Time.   Follow-Up: At [facility], you and your health needs are our priority.  As part of our continuing mission to provide you with exceptional heart care, we have created designated Provider Care Teams.  These Care Teams include your primary Cardiologist (physician) and Advanced Practice Providers (APPs -  Physician Assistants and Nurse Practitioners) who all work together to provide you with the care you need, when you need it.  Your next appointment:   6 month(s)  The format for your next appointment:   In Person  Provider:   Pixie Casino, MD

## 2022-10-21 NOTE — Progress Notes (Signed)
OFFICE NOTE  Chief Complaint:  Follow-up  Primary Care Physician: Redmond School, MD  HPI:  [name] is a 66 y.o. male who had seen Dr. Johnsie Cancel, with a past medial history significant for calcium scoring which was significantly abnormal showing a high calcium score of 2925, 98th percentile for age and sex matched controls.  He also had had exercise treadmill testing and an echocardiogram just prior to that as well which were only mildly abnormal.  He does have a history of dyslipidemia with very high triglycerides in the past but has been reasonably controlled on fenofibrate.  He was started on rosuvastatin 10 mg daily but he said he took it for 4 days and was nauseated every day.  He then stopped it and his symptoms resolved.  He was camping at the time and conceded that it may have been some other reason why he was nauseated.  He then discarded all the medication.  He is now referred for recommendations for further therapy.  His lipid profile in November 2022 showed total cholesterol 164, triglycerides 181, HDL 35 and LDL of 93.  His target LDL should be less than 70.  He also was noted to have some abnormal liver enzymes and based on ultrasound of the abdomen in 2019 does have hepatic steatosis.  Recent AST and ALT were 51 and 84 respectively.  He says he has been working hard on his diet but understands the importance of medication therapy given his high calcium score.  10/03/2022  [name] returns today for follow-up of his lipids.  He has had significant further improvement in his cholesterol.  His LDL particle numbers now 759, 8 LDL-C of 62, triglycerides are normal at 111.  Overall he seems to be doing well on his therapies.  What is concerning however is he has had worsening shortness of breath with exertion.  He has had decreased exercise intolerance and fatigue.  He reported his wife and daughter were quite concerned about him.  He says he has a 54-year-old grandchild and has to  stop and catch his breath often when doing normal activities or running around with him.  EKG shows a sinus bradycardia today with a right bundle branch block and 59.  As mentioned he had undergone an exercise tolerance test in October 2022 which was negative.  He had an echo as well which showed mild aortic insufficiency and a dilated aortic root to 43 mm.  10/21/2022  [name] is seen today for follow-up of his CT coronary angiogram.  Compared to his calcium score, which demonstrated a calcium score of 2925, the CT coronary angiogram performed about a month later shows a calcium score 4017 with severe stenosis of a proximal posterior lateral branch of the RCA and extensive multivessel coronary artery calcification with visually no more than moderate stenosis.  FFR did confirm that there was flow-limiting stenosis in the R-PLB branch.  He did not have any anginal symptoms, but has had shortness of breath.  PMHx:  Past Medical History:  Diagnosis Date   History of kidney stones    Hypertriglyceridemia    OSA on CPAP    POST  SLEEP APNEA SURGERY --- PER SLEEP STUDY 06-26-15-2006  MODERATE OSA   USES CPAP DAILY   Renal disorder    Scrotal swelling    Shortness of breath dyspnea    with exertion   Urgency of urination    OCCASIONAL    Past Surgical History:  Procedure Laterality  Date   APPENDECTOMY     CHOLECYSTECTOMY N/A 08/07/2018   Procedure: LAPAROSCOPIC CHOLECYSTECTOMY;  Surgeon: Aviva Signs, MD;  [facility];  Service: General;  Laterality: N/A;   COLONOSCOPY     COLONOSCOPY N/A 06/05/2018   Procedure: COLONOSCOPY;  Surgeon: Daneil Dolin, MD;  [facility];  Service: Endoscopy;  Laterality: N/A;  7:30   DISTAL BICEPS TENDON REPAIR Right 01/30/2015   Procedure: DISTAL BICEPS TENDON REPAIR;  Surgeon: Marybelle Killings, MD;  Location: [facility];  Service: Orthopedics;  Laterality: Right;   EXPLORATION LAPAROTOMY/  PARTIAL COLECTOMY WITH RESECTION TERMINAL ILEUM (APPENDICEAL  MASS)  01-16-2010   BENIGN NEOPLASM APPENDIX   HYDROCELE EXCISION Right 03/18/2014   Procedure:  RIGHT SPERMACELECTOMY;  Surgeon: Claybon Jabs, MD;  Location: [facility];  Service: Urology;  Laterality: Right;   KNEE ARTHROSCOPY Right 2010   LITHOTRIPSY     LIVER BIOPSY  08/07/2018   Procedure: LIVER BIOPSY;  Surgeon: Aviva Signs, MD;  [facility];  Service: General;;   POLYPECTOMY  06/05/2018   Procedure: POLYPECTOMY;  Surgeon: Daneil Dolin, MD;  [facility];  Service: Endoscopy;;  ascending and descending   TONSILLECTOMY     UVULOPALATOPHARYNGOPLASTY  2000 (APPROX)   W/  DEVIATED SEPTUM REPAIR  AND TONSILLECTOMY    FAMHx:  No family history on file.  SOCHx:   reports that he has never smoked. He has never used smokeless tobacco. He reports that he does not currently use alcohol. He reports that he does not use drugs.  ALLERGIES:  No Known Allergies  ROS: Pertinent items noted in HPI and remainder of comprehensive ROS otherwise negative.  HOME MEDS: Current Outpatient Medications on File Prior to Visit  Medication Sig Dispense Refill   fenofibrate 160 MG tablet Take 160 mg by mouth daily.      olmesartan (BENICAR) 40 MG tablet Take 40 mg by mouth daily.     Omega-3 Fatty Acids (FISH OIL) 1200 MG CAPS Take 1,200 mg by mouth daily.     rosuvastatin (CRESTOR) 5 MG tablet Take 1 tablet (5 mg total) by mouth daily. 90 tablet 3   traZODone (DESYREL) 50 MG tablet Take 50 mg by mouth at bedtime.     celecoxib (CELEBREX) 200 MG capsule Take 200 mg by mouth 2 (two) times daily as needed. (Patient not taking: Reported on 10/21/2022)     ketoconazole (NIZORAL) 2 % cream daily. (Patient not taking: Reported on 10/21/2022)     No current facility-administered medications on file prior to visit.    LABS/IMAGING: No results found for this or any previous visit (from the past 48 hour(s)). No results found.  LIPID PANEL:    Component Value Date/Time    CHOL 164 09/19/2021 0818   TRIG 181 (H) 09/19/2021 0818   HDL 35 (L) 09/19/2021 0818   CHOLHDL 4.7 09/19/2021 0818   VLDL 36 09/19/2021 0818   LDLCALC 93 09/19/2021 0818     WEIGHTS: Wt Readings from Last 3 Encounters:  10/21/22 249 lb (112.9 kg)  10/03/22 253 lb 9.6 oz (115 kg)  08/31/22 255 lb (115.7 kg)    VITALS: BP 118/72 (BP Location: Left Arm, Patient Position: Sitting, Cuff Size: Large)   Pulse 81   Ht '6\' 2"'$  (1.88 m)   Wt 249 lb (112.9 kg)   SpO2 97%   BMI 31.97 kg/m   EXAM: Deferred  EKG: Deferred  ASSESSMENT: Progressive fatigue, dyspnea on exertion -  high CAC score greater than 4000 with multivessel coronary calcification and flow-limiting stenosis of the R-PLB branch (09/2022) Mixed dyslipidemia, goal LDL less than 70 High CAC score of 2925, 98th percentile for age and sex matched controls History of high triglycerides Family history of heart disease in both parents  PLAN: 1.   [name] was found to have probable flow-limiting stenosis of the R PLB branch.  This is distal and I would not expect to give him shortness of breath however could give him angina.  He is not really describing typical anginal symptoms with occasional right sternal tightness that sometimes radiates to the back.  I would recommend starting aspirin therapy given his extensive calcification.  If he becomes symptomatic we could try additional antianginal therapy or refer him for definitive cardiac catheterization.  He is in agreement with this strategy.  He also had an echocardiogram which showed normal LVEF however mild AI with a dilated aortic root to 43 mm.  This finding was not noted on cardiac CT.  He will need imaging follow-up of this in 1 year.  Will continue to try to get his lipids well-controlled.  Follow-up with me in 6 months or sooner as necessary.    Pixie Casino, MD, [facility], [place] Director of the Advanced Lipid Disorders &   Cardiovascular Risk Reduction Clinic Diplomate of the American Board of Clinical Lipidology Attending Cardiologist  Direct Dial: [phone]  Fax: [phone]  Website:  [email or web address]   Nadean Corwin Korby Ratay 10/21/2022, 10:20 AM

## 2022-10-28 DIAGNOSIS — D518 Other vitamin B12 deficiency anemias: Secondary | ICD-10-CM | POA: Diagnosis not present

## 2022-10-28 DIAGNOSIS — E559 Vitamin D deficiency, unspecified: Secondary | ICD-10-CM | POA: Diagnosis not present

## 2022-10-28 DIAGNOSIS — Z125 Encounter for screening for malignant neoplasm of prostate: Secondary | ICD-10-CM | POA: Diagnosis not present

## 2022-10-28 DIAGNOSIS — Z0001 Encounter for general adult medical examination with abnormal findings: Secondary | ICD-10-CM | POA: Diagnosis not present

## 2022-10-28 DIAGNOSIS — R7309 Other abnormal glucose: Secondary | ICD-10-CM | POA: Diagnosis not present

## 2022-10-28 DIAGNOSIS — I10 Essential (primary) hypertension: Secondary | ICD-10-CM | POA: Diagnosis not present

## 2022-10-28 DIAGNOSIS — E039 Hypothyroidism, unspecified: Secondary | ICD-10-CM | POA: Diagnosis not present

## 2022-10-28 DIAGNOSIS — Z1331 Encounter for screening for depression: Secondary | ICD-10-CM | POA: Diagnosis not present

## 2022-10-28 DIAGNOSIS — M47812 Spondylosis without myelopathy or radiculopathy, cervical region: Secondary | ICD-10-CM | POA: Diagnosis not present

## 2022-11-24 ENCOUNTER — Emergency Department (HOSPITAL_COMMUNITY): Payer: Medicare Other

## 2022-11-24 ENCOUNTER — Emergency Department (HOSPITAL_COMMUNITY)
Admission: EM | Admit: 2022-11-24 | Discharge: 2022-11-24 | Disposition: A | Discharge status: Home / Self Care | Payer: Medicare Other | Attending: Emergency Medicine | Admitting: Emergency Medicine

## 2022-11-24 ENCOUNTER — Other Ambulatory Visit: Payer: Self-pay

## 2022-11-24 ENCOUNTER — Encounter (HOSPITAL_COMMUNITY): Payer: Self-pay | Admitting: *Deleted

## 2022-11-24 DIAGNOSIS — N200 Calculus of kidney: Secondary | ICD-10-CM | POA: Insufficient documentation

## 2022-11-24 DIAGNOSIS — N400 Enlarged prostate without lower urinary tract symptoms: Secondary | ICD-10-CM | POA: Diagnosis not present

## 2022-11-24 DIAGNOSIS — N132 Hydronephrosis with renal and ureteral calculous obstruction: Secondary | ICD-10-CM | POA: Diagnosis not present

## 2022-11-24 DIAGNOSIS — R109 Unspecified abdominal pain: Secondary | ICD-10-CM

## 2022-11-24 DIAGNOSIS — K5730 Diverticulosis of large intestine without perforation or abscess without bleeding: Secondary | ICD-10-CM | POA: Diagnosis not present

## 2022-11-24 DIAGNOSIS — N281 Cyst of kidney, acquired: Secondary | ICD-10-CM | POA: Diagnosis not present

## 2022-11-24 DIAGNOSIS — Z7982 Long term (current) use of aspirin: Secondary | ICD-10-CM | POA: Insufficient documentation

## 2022-11-24 HISTORY — DX: Essential (primary) hypertension: I10

## 2022-11-24 LAB — CBC WITH DIFFERENTIAL/PLATELET
Abs Immature Granulocytes: 0.04 10*3/uL (ref 0.00–0.07)
Basophils Absolute: 0 10*3/uL (ref 0.0–0.1)
Basophils Relative: 0 %
Eosinophils Absolute: 0.2 10*3/uL (ref 0.0–0.5)
Eosinophils Relative: 2 %
HCT: 44.8 % (ref 39.0–52.0)
Hemoglobin: 15.1 g/dL (ref 13.0–17.0)
Immature Granulocytes: 0 %
Lymphocytes Relative: 14 %
Lymphs Abs: 1.6 10*3/uL (ref 0.7–4.0)
MCH: 31.9 pg (ref 26.0–34.0)
MCHC: 33.7 g/dL (ref 30.0–36.0)
MCV: 94.7 fL (ref 80.0–100.0)
Monocytes Absolute: 1 10*3/uL (ref 0.1–1.0)
Monocytes Relative: 9 %
Neutro Abs: 8.2 10*3/uL — ABNORMAL HIGH (ref 1.7–7.7)
Neutrophils Relative %: 75 %
Platelets: 209 10*3/uL (ref 150–400)
RBC: 4.73 MIL/uL (ref 4.22–5.81)
RDW: 13.2 % (ref 11.5–15.5)
WBC: 11 10*3/uL — ABNORMAL HIGH (ref 4.0–10.5)
nRBC: 0 % (ref 0.0–0.2)

## 2022-11-24 LAB — COMPREHENSIVE METABOLIC PANEL
ALT: 37 U/L (ref 0–44)
AST: 29 U/L (ref 15–41)
Albumin: 4.1 g/dL (ref 3.5–5.0)
Alkaline Phosphatase: 41 U/L (ref 38–126)
Anion gap: 10 (ref 5–15)
BUN: 23 mg/dL (ref 8–23)
CO2: 27 mmol/L (ref 22–32)
Calcium: 9.8 mg/dL (ref 8.9–10.3)
Chloride: 98 mmol/L (ref 98–111)
Creatinine, Ser: 1.72 mg/dL — ABNORMAL HIGH (ref 0.61–1.24)
GFR, Estimated: 43 mL/min — ABNORMAL LOW (ref >60)
Glucose, Bld: 137 mg/dL — ABNORMAL HIGH (ref 70–99)
Potassium: 4.1 mmol/L (ref 3.5–5.1)
Sodium: 135 mmol/L (ref 135–145)
Total Bilirubin: 0.9 mg/dL (ref 0.3–1.2)
Total Protein: 7.5 g/dL (ref 6.5–8.1)

## 2022-11-24 LAB — URINALYSIS, ROUTINE W REFLEX MICROSCOPIC
Bacteria, UA: NONE SEEN
Bilirubin Urine: NEGATIVE
Glucose, UA: NEGATIVE mg/dL
Ketones, ur: NEGATIVE mg/dL
Leukocytes,Ua: NEGATIVE
Nitrite: NEGATIVE
Protein, ur: 30 mg/dL — AB
RBC / HPF: >50 RBC/hpf — ABNORMAL HIGH (ref 0–5)
Specific Gravity, Urine: 1.018 (ref 1.005–1.030)
pH: 5 (ref 5.0–8.0)

## 2022-11-24 MED ORDER — OXYCODONE-ACETAMINOPHEN 5-325 MG PO TABS: 1.0000 | ORAL_TABLET | Freq: Four times a day (QID) | ORAL | 0 refills | Status: DC | PRN

## 2022-11-24 MED ORDER — ONDANSETRON 4 MG PO TBDP: ORAL_TABLET | ORAL | 0 refills | Status: DC

## 2022-11-24 MED ORDER — ONDANSETRON 4 MG PO TBDP
4.0000 mg | ORAL_TABLET | Freq: Once | ORAL | Status: AC
  Administered 2022-11-24: 4 mg via ORAL
  Filled 2022-11-24: qty 1

## 2022-11-24 MED ORDER — TAMSULOSIN HCL 0.4 MG PO CAPS: 0.4000 mg | ORAL_CAPSULE | Freq: Every day | ORAL | 0 refills | Status: DC

## 2022-11-24 MED ORDER — OXYCODONE-ACETAMINOPHEN 5-325 MG PO TABS
1.0000 | ORAL_TABLET | Freq: Once | ORAL | Status: AC
  Administered 2022-11-24: 1 via ORAL
  Filled 2022-11-24: qty 1

## 2022-11-24 MED ORDER — HYDROMORPHONE HCL 1 MG/ML IJ SOLN
1.0000 mg | Freq: Once | INTRAMUSCULAR | Status: AC
  Administered 2022-11-24: 1 mg via INTRAMUSCULAR
  Filled 2022-11-24: qty 1

## 2022-11-24 NOTE — Discharge Instructions (Signed)
Follow-up with one of the urologist this week

## 2022-11-24 NOTE — ED Provider Notes (Signed)
[facility] EMERGENCY DEPARTMENT Provider Note   CSN: [id number] Arrival date & time: 11/24/22  1422     History {Add pertinent medical, surgical, social history, OB history to HPI:1} Chief Complaint  Patient presents with   Flank Pain    [name] is a 67 y.o. male.  Patient has a history of kidney stones.  He comes in with right flank pain for a few days   Flank Pain       Home Medications Prior to Admission medications   Medication Sig Start Date End Date Taking? Authorizing Provider  ondansetron (ZOFRAN-ODT) 4 MG disintegrating tablet '4mg'$  ODT q4 hours prn nausea/vomit 11/24/22  Yes Milton Ferguson, MD  oxyCODONE-acetaminophen (PERCOCET/ROXICET) 5-325 MG tablet Take 1 tablet by mouth every 6 (six) hours as needed for severe pain. 11/24/22  Yes Milton Ferguson, MD  oxyCODONE-acetaminophen (PERCOCET/ROXICET) 5-325 MG tablet Take 1 tablet by mouth every 6 (six) hours as needed for severe pain. 11/24/22  Yes Milton Ferguson, MD  tamsulosin (FLOMAX) 0.4 MG CAPS capsule Take 1 capsule (0.4 mg total) by mouth daily. 11/24/22  Yes Milton Ferguson, MD  aspirin EC 81 MG tablet Take 1 tablet (81 mg total) by mouth daily. Swallow whole. 10/21/22   Hilty, Nadean Corwin, MD  celecoxib (CELEBREX) 200 MG capsule Take 200 mg by mouth 2 (two) times daily as needed. Patient not taking: Reported on 10/21/2022 07/01/22   [provider]  fenofibrate 160 MG tablet Take 160 mg by mouth daily.     [provider]  ketoconazole (NIZORAL) 2 % cream daily. Patient not taking: Reported on 10/21/2022    [provider]  olmesartan (BENICAR) 40 MG tablet Take 40 mg by mouth daily. 09/14/22   [provider]  Omega-3 Fatty Acids (FISH OIL) 1200 MG CAPS Take 1,200 mg by mouth daily.    [provider]  rosuvastatin (CRESTOR) 5 MG tablet Take 1 tablet (5 mg total) by mouth daily. 02/19/22 10/21/22  Pixie Casino, MD  traZODone (DESYREL) 50 MG tablet Take 50 mg by mouth at  bedtime.    [provider]      Allergies    Patient has no known allergies.    Review of Systems   Review of Systems  Genitourinary:  Positive for flank pain.    Physical Exam Updated Vital Signs BP (!) 122/94 (BP Location: Right Arm)   Pulse 69   Temp 97.8 F (36.6 C) (Oral)   Resp 15   Ht '6\' 2"'$  (1.88 m)   Wt 112.9 kg   SpO2 98%   BMI 31.97 kg/m  Physical Exam  ED Results / Procedures / Treatments   Labs (all labs ordered are listed, but only abnormal results are displayed) Labs Reviewed  URINALYSIS, ROUTINE W REFLEX MICROSCOPIC - Abnormal; Notable for the following components:      Result Value   APPearance HAZY (*)    Hgb urine dipstick LARGE (*)    Protein, ur 30 (*)    RBC / HPF >50 (*)    All other components within normal limits  CBC WITH DIFFERENTIAL/PLATELET - Abnormal; Notable for the following components:   WBC 11.0 (*)    Neutro Abs 8.2 (*)    All other components within normal limits  COMPREHENSIVE METABOLIC PANEL - Abnormal; Notable for the following components:   Glucose, Bld 137 (*)    Creatinine, Ser 1.72 (*)    GFR, Estimated 43 (*)    All other components  within normal limits    EKG None  Radiology CT Renal Stone Study  Result Date: 11/24/2022 CLINICAL DATA:  Right flank pain for a week, concern for kidney stone. EXAM: CT ABDOMEN AND PELVIS WITHOUT CONTRAST TECHNIQUE: Multidetector CT imaging of the abdomen and pelvis was performed following the standard protocol without IV contrast. RADIATION DOSE REDUCTION: This exam was performed according to the departmental dose-optimization program which includes automated exposure control, adjustment of the mA and/or kV according to patient size and/or use of iterative reconstruction technique. COMPARISON:  CT abdomen pelvis dated 11/30/2010 FINDINGS: Lower chest: There is mild bibasilar atelectasis. Hepatobiliary: No focal liver abnormality is seen. Status post cholecystectomy. No biliary  dilatation. Pancreas: Unremarkable. No pancreatic ductal dilatation or surrounding inflammatory changes. Spleen: Normal in size without focal abnormality. Adrenals/Urinary Tract: Adrenal glands are unremarkable. Two obstructing ureteral calculi in the midportion of the right ureter results in moderate right hydroureteronephrosis. The more distal calculus measures 10 x 8 mm and the more proximal calculus measures 7 x 6 mm in size. A nonobstructive right renal calculus measures 2 mm in size. A left renal cyst measures 2.9 cm. A nonobstructive left renal calculus measures 2 mm in size. Hydronephrosis on the left. The urinary bladder is unremarkable. Stomach/Bowel: Stomach is within normal limits. Postoperative changes are seen at the cecum. There is colonic diverticulosis without evidence of diverticulitis. No evidence of bowel wall thickening, distention, or inflammatory changes. Vascular/Lymphatic: Aortic atherosclerosis. No enlarged abdominal or pelvic lymph nodes. Reproductive: The prostate is enlarged, measuring 5.3 cm in transverse dimension. Other: No abdominal wall hernia or abnormality. No abdominopelvic ascites. Musculoskeletal: Moderate degenerative changes are seen in the spine. 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 IMPRESSION: 1. Two obstructing calculi in the midportion of the right ureter result in moderate right hydroureteronephrosis. 2. 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 Aortic Atherosclerosis (ICD10-I70.0). Electronically Signed   By: Zerita Boers M.D.   On: 11/24/2022 15:50    Procedures Procedures  {Document cardiac monitor, telemetry assessment procedure when appropriate:1}  Medications Ordered in ED Medications  oxyCODONE-acetaminophen (PERCOCET/ROXICET) 5-325 MG per tablet 1 tablet (1 tablet Oral Given 11/24/22 1551)  HYDROmorphone (DILAUDID) injection 1 mg (1 mg Intramuscular Given 11/24/22 1703)  ondansetron  (ZOFRAN-ODT) disintegrating tablet 4 mg (4 mg Oral Given 11/24/22 1703)    ED Course/ Medical Decision Making/ A&P                           Medical Decision Making Amount and/or Complexity of Data Reviewed Labs: ordered. Radiology: ordered.  Risk Prescription drug management.   Patient has 2 kidney stones on the right.  I spoke with urology Dr. Junious Silk and he feels like the patient follow-up as an outpatient this week.  Patient given pain medicine and nausea medicine and Flomax  {Document critical care time when appropriate:1} {Document review of labs and clinical decision tools ie heart score, Chads2Vasc2 etc:1}  {Document your independent review of radiology images, and any outside records:1} {Document your discussion with family members, caretakers, and with consultants:1} {Document social determinants of health affecting pt's care:1} {Document your decision making why or why not admission, treatments were needed:1} Final Clinical Impression(s) / ED Diagnoses Final diagnoses:  Flank pain    Rx / DC Orders ED Discharge Orders          Ordered    ondansetron (ZOFRAN-ODT)  4 MG disintegrating tablet        11/24/22 1719    oxyCODONE-acetaminophen (PERCOCET/ROXICET) 5-325 MG tablet  Every 6 hours PRN        11/24/22 1719    tamsulosin (FLOMAX) 0.4 MG CAPS capsule  Daily        11/24/22 1719    oxyCODONE-acetaminophen (PERCOCET/ROXICET) 5-325 MG tablet  Every 6 hours PRN        11/24/22 1719

## 2022-11-24 NOTE — ED Triage Notes (Signed)
Pt with right flank pain for a week, getting worse.  Denies any blood in urine. Pt with hx of kidney stones. Denies any burning with urination. Mild nausea. Also c/o right testicle is painful.  C/io constipation-LBM today, states took a laxative last night and again earlier today.

## 2022-11-25 MED FILL — Oxycodone w/ Acetaminophen Tab 5-325 MG: ORAL | Qty: 6 | Status: AC

## 2022-11-28 ENCOUNTER — Ambulatory Visit (HOSPITAL_COMMUNITY)
Admission: RE | Admit: 2022-11-28 | Discharge: 2022-11-28 | Disposition: A | Discharge status: Home / Self Care | Payer: Medicare Other | Source: Ambulatory Visit | Attending: Urology | Admitting: Urology

## 2022-11-28 ENCOUNTER — Telehealth: Payer: Self-pay

## 2022-11-28 ENCOUNTER — Ambulatory Visit (INDEPENDENT_AMBULATORY_CARE_PROVIDER_SITE_OTHER): Payer: Medicare Other | Admitting: Urology

## 2022-11-28 ENCOUNTER — Encounter: Payer: Self-pay | Admitting: Urology

## 2022-11-28 ENCOUNTER — Telehealth: Payer: Self-pay | Admitting: *Deleted

## 2022-11-28 VITALS — BP 106/70 | HR 106

## 2022-11-28 DIAGNOSIS — N201 Calculus of ureter: Secondary | ICD-10-CM

## 2022-11-28 DIAGNOSIS — M47816 Spondylosis without myelopathy or radiculopathy, lumbar region: Secondary | ICD-10-CM | POA: Diagnosis not present

## 2022-11-28 DIAGNOSIS — N200 Calculus of kidney: Secondary | ICD-10-CM | POA: Diagnosis not present

## 2022-11-28 DIAGNOSIS — N132 Hydronephrosis with renal and ureteral calculous obstruction: Secondary | ICD-10-CM

## 2022-11-28 LAB — URINALYSIS, ROUTINE W REFLEX MICROSCOPIC
Bilirubin, UA: NEGATIVE
Glucose, UA: NEGATIVE
Ketones, UA: NEGATIVE
Leukocytes,UA: NEGATIVE
Nitrite, UA: NEGATIVE
Protein,UA: NEGATIVE
Specific Gravity, UA: 1.02 (ref 1.005–1.030)
Urobilinogen, Ur: 2 mg/dL — ABNORMAL HIGH (ref 0.2–1.0)
pH, UA: 5.5 (ref 5.0–7.5)

## 2022-11-28 LAB — MICROSCOPIC EXAMINATION: Bacteria, UA: NONE SEEN

## 2022-11-28 NOTE — H&P (View-Only) (Signed)
Subjective: 1. Ureteral calculus, right   2. Hydronephrosis with urinary obstruction due to ureteral calculus      [name] is a 67 yo WM who was in the ER on Sunday with right flank pain that was severe with nausea.   He had microhematuria.  He was found to have 2 stones in the left lower proximal/mid ureter with obstruction. The largest was 10x17m.   He last took a pain pill this morning.   He has had prior ESWL remotely.  He had a spermatocelectomy in 2015 by Dr. OKarsten Ro  His has some frequency but no urgency.  His IPSS is 11.  He was hospitalized in July for dehydration and has drinking increased fluid.  He doesn't feel he empties all the way.  His Cr was 1.72 on 11/24/22 which is up from 1.23 in 11/23.   His UA had 50 RBC's.  He had a syncopal episode in 11/23 and was evaluated by cardiology and he was found to have CAD with marked coronary calcification.  He had mild Aortic insufficiency.   The stones are visible in the right mid ureter on KUB today.   His UA has 3-10 RBC's.   ROS:  Review of Systems  HENT:  Positive for congestion.   Respiratory:  Positive for cough and shortness of breath.   Gastrointestinal:  Positive for constipation.  All other systems reviewed and are negative.   No Known Allergies  Past Medical History:  Diagnosis Date   History of kidney stones    Hypertension    Hypertriglyceridemia    OSA on CPAP    POST  SLEEP APNEA SURGERY --- PER SLEEP STUDY 06-26-15-2006  MODERATE OSA   USES CPAP DAILY   Renal disorder    Scrotal swelling    Shortness of breath dyspnea    with exertion   Urgency of urination    OCCASIONAL    Past Surgical History:  Procedure Laterality Date   APPENDECTOMY     CHOLECYSTECTOMY N/A 08/07/2018   Procedure: LAPAROSCOPIC CHOLECYSTECTOMY;  Surgeon: JAviva Signs MD;  Location: [facility];  Service: General;  Laterality: N/A;   COLONOSCOPY     COLONOSCOPY N/A 06/05/2018   Procedure: COLONOSCOPY;  Surgeon: RDaneil Dolin MD;  [facility];  Service: Endoscopy;  Laterality: N/A;  7:30   DISTAL BICEPS TENDON REPAIR Right 01/30/2015   Procedure: DISTAL BICEPS TENDON REPAIR;  Surgeon: MMarybelle Killings MD;  [facility]  Service: Orthopedics;  Laterality: Right;   EXPLORATION LAPAROTOMY/  PARTIAL COLECTOMY WITH RESECTION TERMINAL ILEUM (APPENDICEAL MASS)  01-16-2010   BENIGN NEOPLASM APPENDIX   HYDROCELE EXCISION Right 03/18/2014   Procedure:  RIGHT SPERMACELECTOMY;  Surgeon: MClaybon Jabs MD;  [facility]  Service: Urology;  Laterality: Right;   KNEE ARTHROSCOPY Right 2010   LITHOTRIPSY     LIVER BIOPSY  08/07/2018   Procedure: LIVER BIOPSY;  Surgeon: JAviva Signs MD;  Location: [facility];  Service: General;;   POLYPECTOMY  06/05/2018   Procedure: POLYPECTOMY;  Surgeon: RDaneil Dolin MD;  [facility];  Service: Endoscopy;;  ascending and descending   TONSILLECTOMY     UVULOPALATOPHARYNGOPLASTY  2000 (APPROX)   W/  DEVIATED SEPTUM REPAIR  AND TONSILLECTOMY    Social History   Socioeconomic History   Marital status: Married    Spouse name: Not on file   Number of children: Not on file   Years of education: Not on file  Highest education level: Not on file  Occupational History   Not on file  Tobacco Use   Smoking status: Never   Smokeless tobacco: Never  Vaping Use   Vaping Use: Never used  Substance and Sexual Activity   Alcohol use: Not Currently    Comment: RARE   Drug use: No   Sexual activity: Not on file  Other Topics Concern   Not on file  Social History Narrative   Not on file   Social Determinants of Health   Financial Resource Strain: Low Risk  (07/04/2022)   Overall Financial Resource Strain (CARDIA)    Difficulty of Paying Living Expenses: Not hard at all  Food Insecurity: No Food Insecurity (07/04/2022)   Hunger Vital Sign    Worried About Running Out of Food in the Last Year: Never true    Ran Out of Food in the Last Year: Never true   Transportation Needs: No Transportation Needs (07/04/2022)   PRAPARE - Hydrologist (Medical): No    Lack of Transportation (Non-Medical): No  Physical Activity: Not on file  Stress: Not on file  Social Connections: Not on file  Intimate Partner Violence: Not on file    History reviewed. No pertinent family history.  Anti-infectives: Anti-infectives (From admission, onward)    None       Current Outpatient Medications  Medication Sig Dispense Refill   aspirin EC 81 MG tablet Take 1 tablet (81 mg total) by mouth daily. Swallow whole. 90 tablet 3   fenofibrate 160 MG tablet Take 160 mg by mouth daily.      ketoconazole (NIZORAL) 2 % cream daily.     olmesartan (BENICAR) 40 MG tablet Take 40 mg by mouth daily.     Omega-3 Fatty Acids (FISH OIL) 1200 MG CAPS Take 1,200 mg by mouth daily.     ondansetron (ZOFRAN-ODT) 4 MG disintegrating tablet '4mg'$  ODT q4 hours prn nausea/vomit 10 tablet 0   oxyCODONE-acetaminophen (PERCOCET/ROXICET) 5-325 MG tablet Take 1 tablet by mouth every 6 (six) hours as needed for severe pain. 20 tablet 0   oxyCODONE-acetaminophen (PERCOCET/ROXICET) 5-325 MG tablet Take 1 tablet by mouth every 6 (six) hours as needed for severe pain. 6 tablet 0   tamsulosin (FLOMAX) 0.4 MG CAPS capsule Take 1 capsule (0.4 mg total) by mouth daily. 10 capsule 0   traZODone (DESYREL) 50 MG tablet Take 50 mg by mouth at bedtime.     celecoxib (CELEBREX) 200 MG capsule Take 200 mg by mouth 2 (two) times daily as needed. (Patient not taking: Reported on 11/28/2022)     rosuvastatin (CRESTOR) 5 MG tablet Take 1 tablet (5 mg total) by mouth daily. 90 tablet 3   No current facility-administered medications for this visit.     Objective: Vital signs in last 24 hours: BP 106/70   Pulse (!) 106   Intake/Output from previous day: No intake/output data recorded. Intake/Output this shift: '@IOTHISSHIFT'$ @   Physical Exam Vitals reviewed.   Constitutional:      Appearance: Normal appearance.  Cardiovascular:     Rate and Rhythm: Normal rate and regular rhythm.     Heart sounds: Normal heart sounds.  Pulmonary:     Effort: Pulmonary effort is normal. No respiratory distress.     Breath sounds: Normal breath sounds.  Abdominal:     General: Abdomen is flat.  Musculoskeletal:        General: No swelling. Normal range of motion.  Skin:  General: Skin is warm and dry.  Neurological:     General: No focal deficit present.     Mental Status: He is alert and oriented to person, place, and time.     Lab Results:  No results found for this or any previous visit (from the past 24 hour(s)).   BMET No results for input(s): "NA", "K", "CL", "CO2", "GLUCOSE", "BUN", "CREATININE", "CALCIUM" in the last 72 hours. PT/INR No results for input(s): "LABPROT", "INR" in the last 72 hours. ABG No results for input(s): "PHART", "HCO3" in the last 72 hours.  Invalid input(s): "PCO2", "PO2" UA has 3-10 RBC's.  Results for orders placed or performed in visit on 11/28/22 (from the past 72 hour(s))  Urinalysis, Routine w reflex microscopic     Status: Abnormal   Collection Time: 11/28/22  8:54 AM  Result Value Ref Range   Specific Gravity, UA 1.020 1.005 - 1.030   pH, UA 5.5 5.0 - 7.5   Color, UA Yellow Yellow   Appearance Ur Clear Clear   Leukocytes,UA Negative Negative   Protein,UA Negative Negative/Trace   Glucose, UA Negative Negative   Ketones, UA Negative Negative   RBC, UA 2+ (A) Negative   Bilirubin, UA Negative Negative   Urobilinogen, Ur 2.0 (H) 0.2 - 1.0 mg/dL   Nitrite, UA Negative Negative   Microscopic Examination See below:   Microscopic Examination     Status: Abnormal   Collection Time: 11/28/22  8:54 AM   Urine  Result Value Ref Range   WBC, UA 0-5 0 - 5 /hpf   RBC, Urine 3-10 (A) 0 - 2 /hpf   Epithelial Cells (non renal) 0-10 0 - 10 /hpf   Casts Present (A) None seen /lpf   Cast Type Hyaline casts N/A    Bacteria, UA None seen None seen/Few    Studies/Results: DG Abd 1 View  Result Date: 11/28/2022 CLINICAL DATA:  Ureteral stone EXAM: ABDOMEN - 1 VIEW COMPARISON:  11/24/2022 CT scan FINDINGS: Bowel gas pattern unremarkable. Splenic artery atherosclerotic calcification noted. On the CT scan of 11/24/2022 there is a notable cluster of right ureteral calculi just below the level of the iliac crest. This appears to potentially have migrated slightly distally, currently projecting over the right sacrum, with the cluster measuring about 1.6 by 0.7 cm in total No other urinary tract calculi are identified. Lumbar spondylosis. IMPRESSION: 1. Cluster of right ureteral calculi appears to have migrated slightly distally in the right ureter, currently projecting over the right sacrum. 2. Splenic artery atherosclerotic calcification. 3. Lumbar spondylosis. Electronically Signed   By: Van Clines M.D.   On: 11/28/2022 10:06   DG Abd 1 View  Result Date: 11/28/2022 CLINICAL DATA:  Ureteral stone EXAM: ABDOMEN - 1 VIEW COMPARISON:  11/24/2022 CT scan FINDINGS: Bowel gas pattern unremarkable. Splenic artery atherosclerotic calcification noted. On the CT scan of 11/24/2022 there is a notable cluster of right ureteral calculi just below the level of the iliac crest. This appears to potentially have migrated slightly distally, currently projecting over the right sacrum, with the cluster measuring about 1.6 by 0.7 cm in total No other urinary tract calculi are identified. Lumbar spondylosis. IMPRESSION: 1. Cluster of right ureteral calculi appears to have migrated slightly distally in the right ureter, currently projecting over the right sacrum. 2. Splenic artery atherosclerotic calcification. 3. Lumbar spondylosis. Electronically Signed   By: Van Clines M.D.   On: 11/28/2022 10:06   CT Renal Stone Study  Result Date: 11/24/2022  CLINICAL DATA:  Right flank pain for a week, concern for kidney stone. EXAM: CT  ABDOMEN AND PELVIS WITHOUT CONTRAST TECHNIQUE: Multidetector CT imaging of the abdomen and pelvis was performed following the standard protocol without IV contrast. RADIATION DOSE REDUCTION: This exam was performed according to the departmental dose-optimization program which includes automated exposure control, adjustment of the mA and/or kV according to patient size and/or use of iterative reconstruction technique. COMPARISON:  CT abdomen pelvis dated 11/30/2010 FINDINGS: Lower chest: There is mild bibasilar atelectasis. Hepatobiliary: No focal liver abnormality is seen. Status post cholecystectomy. No biliary dilatation. Pancreas: Unremarkable. No pancreatic ductal dilatation or surrounding inflammatory changes. Spleen: Normal in size without focal abnormality. Adrenals/Urinary Tract: Adrenal glands are unremarkable. Two obstructing ureteral calculi in the midportion of the right ureter results in moderate right hydroureteronephrosis. The more distal calculus measures 10 x 8 mm and the more proximal calculus measures 7 x 6 mm in size. A nonobstructive right renal calculus measures 2 mm in size. A left renal cyst measures 2.9 cm. A nonobstructive left renal calculus measures 2 mm in size. Hydronephrosis on the left. The urinary bladder is unremarkable. Stomach/Bowel: Stomach is within normal limits. Postoperative changes are seen at the cecum. There is colonic diverticulosis without evidence of diverticulitis. No evidence of bowel wall thickening, distention, or inflammatory changes. Vascular/Lymphatic: Aortic atherosclerosis. No enlarged abdominal or pelvic lymph nodes. Reproductive: The prostate is enlarged, measuring 5.3 cm in transverse dimension. Other: No abdominal wall hernia or abnormality. No abdominopelvic ascites. Musculoskeletal: Moderate degenerative changes are seen in the spine. 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 IMPRESSION: 1. Two obstructing calculi in  the midportion of the right ureter result in moderate right hydroureteronephrosis. 2. 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 Aortic Atherosclerosis (ICD10-I70.0). Electronically Signed   By: Zerita Boers M.D.   On: 11/24/2022 15:50   CT CORONARY FFR DATA PREP & FLUID ANALYSIS  Result Date: 10/18/2022 EXAM: CT FFR ANALYSIS MEDICATIONS: MEDICATIONS None FINDINGS: CT FFR analysis was performed on the original cardiac computed tomography angiogram dataset. Diagrammatic representation of the CT FFR analysis is provided in a separate PDF document in PACS. This dictation was created using the PDF document and an interactive 3D model of the results. The 3D model is not available in the EMR/PACS. Normal CT FFR range is >0.80. 1. Left Main: No significant stenosis. 2. LAD: FFR 0.89 mLAD. 3. LCX: FFR 0.96 mLCx 4. RCA: FFR 0.71 distal PLV branch of RCA. IMPRESSION: 1. Hemodynamically significant stenosis in the proximal PLV branch of the RCA. Loralie Champagne, MD Electronically Signed   By: Loralie Champagne M.D.   On: 10/18/2022 16:34   CT CORONARY MORPH W/CTA COR W/SCORE W/CA W/CM &/OR WO/CM  Addendum Date: 10/17/2022   ADDENDUM REPORT: 10/17/2022 17:38 CLINICAL DATA:  Chest pain EXAM: Cardiac CTA MEDICATIONS: Sub lingual nitro. '4mg'$  x 2 TECHNIQUE: The patient was scanned on a Siemens 643 slice scanner. Gantry rotation speed was 250 msecs. Collimation was 0.6 mm. A 100 kV prospective scan was triggered in the ascending thoracic aorta at 35-75% of the R-R interval. Average HR during the scan was 60 bpm. The 3D data set was interpreted on a dedicated work station using MPR, MIP and VRT modes. A total of 80cc of contrast was used. FINDINGS: Non-cardiac: See separate report from [facility]. Pulmonary veins drain normally to the left atrium. No LA appendage thrombus. Calcium Score:  4017 Agatston units. Coronary Arteries: Right dominant with no anomalies LM: No plaque or stenosis. LAD  system: Extensive calcified plaque proximal LAD, suspect moderate (60-69%) stenosis. FFR 0.89 mid LAD, this is not suggestive of hemodynamic significance. Circumflex system: Mixed plaque proximal and mid LCx. Suspect moderate (50-69%) proximal LCx stenosis. FFR 0.96 mid LCx, this is not suggestive of hemodynamic significance. RCA system: Extensive calcified plaque in the RCA, no more than mild (1-24%) stenosis. Extensive mixed plaque in a moderate PLV branch. In the proximal PLV, there was an area of noncalcified plaque with severe (70-99%) stenosis. FFR 0.71 distal PLV. IMPRESSION: 1. Coronary artery calcium score 4017 Agatston units. This places the patient in the 99th percentile for age and gender, suggesting high risk for future cardiac events. 2. Severe stenosis proximal PLV branch of the RCA with noncalcified plaque. This stenosis was hemodynamically significant by FFR. 3. Extensive calcified plaque in the proximal LAD and proximal LCx. Stenosis appeared moderate visually in the proximal LAD and LCx but was not hemodynamically significant by FFR. Dalton Mclean Electronically Signed   By: Loralie Champagne M.D.   On: 10/17/2022 17:38   Result Date: 10/17/2022 EXAM: OVER-READ INTERPRETATION  CT CHEST The following report is an over-read performed by radiologist Dr. Ruthann Cancer of [facility], [place] on 10/17/2022. This over-read does not include interpretation of cardiac or coronary anatomy or pathology. The coronary CTA interpretation by the cardiologist is attached. COMPARISON:  09/17/2021 FINDINGS: Cardiovascular: The heart is normal in size. Mediastinum/Nodes: Visualized esophagus and trachea within normal limits. No mediastinal lymphadenopathy. Lungs/Pleura: The lungs are clear bilaterally. No suspicious pulmonary nodules. Upper Abdomen: The visualized upper abdomen is within normal limits. Musculoskeletal: No acute osseous abnormality. Mild multilevel degenerative changes of the visualized thoracic  spine. IMPRESSION: No acute or significant extracardiac abnormality. Ruthann Cancer, MD [facility] Electronically Signed: By: Ruthann Cancer M.D. On: 10/17/2022 14:17   ECHOCARDIOGRAM COMPLETE  Result Date: 10/04/2022    ECHOCARDIOGRAM REPORT   Patient Name:   [name] Date of Exam: 10/04/2022 Medical Rec #:  [id number]        Height:       74.0 in Accession #:    [id number]       Weight:       253.6 lb Date of Birth:  1956-08-05        BSA:          2.404 m Patient Age:    74 years         BP:           120/74 mmHg Patient Gender: M                HR:           75 bpm. Exam Location:  [facility] Procedure: 2D Echo, Cardiac Doppler and Color Doppler Indications:    I35.1 (ICD-10-CM) - Aortic valve insufficiency, etiology of                 cardiac valve disease unspecified, R06.09 (ICD-10-CM) - Dyspnea                 on exertion, R53.83 (ICD-10-CM) - Fatigue, unspecified type,                 R07.9 (ICD-10-CM) - Chest pain, unspecified type  History:        Patient has prior history of Echocardiogram examinations, most  recent 09/06/2021. Risk Factors:Hypertension and Dyslipidemia.                 Hx of COVID-19, OSA on CPAP (From Hx).  Sonographer:    Alvino Chapel RCS Referring Phys: [place]  1. Left ventricular ejection fraction, by estimation, is 60 to 65%. The left ventricle has normal function. The left ventricle has no regional wall motion abnormalities. There is moderate left ventricular hypertrophy. Left ventricular diastolic parameters are consistent with Grade I diastolic dysfunction (impaired relaxation).  2. Right ventricular systolic function is normal. The right ventricular size is normal.  3. The mitral valve is normal in structure. No evidence of mitral valve regurgitation. No evidence of mitral stenosis.  4. The aortic valve is tricuspid. There is mild calcification of the aortic valve.  There is mild thickening of the aortic valve. Aortic valve regurgitation is mild. No aortic stenosis is present.  5. Aortic dilatation noted. There is mild dilatation of the aortic root, measuring 43 mm.  6. The inferior vena cava is normal in size with greater than 50% respiratory variability, suggesting right atrial pressure of 3 mmHg. FINDINGS  Left Ventricle: Left ventricular ejection fraction, by estimation, is 60 to 65%. The left ventricle has normal function. The left ventricle has no regional wall motion abnormalities. The left ventricular internal cavity size was normal in size. There is  moderate left ventricular hypertrophy. Left ventricular diastolic parameters are consistent with Grade I diastolic dysfunction (impaired relaxation). Normal left ventricular filling pressure. Right Ventricle: The right ventricular size is normal. No increase in right ventricular wall thickness. Right ventricular systolic function is normal. Left Atrium: Left atrial size was normal in size. Right Atrium: Right atrial size was normal in size. Pericardium: There is no evidence of pericardial effusion. Mitral Valve: The mitral valve is normal in structure. No evidence of mitral valve regurgitation. No evidence of mitral valve stenosis. Tricuspid Valve: The tricuspid valve is normal in structure. Tricuspid valve regurgitation is not demonstrated. No evidence of tricuspid stenosis. Aortic Valve: The aortic valve is tricuspid. There is mild calcification of the aortic valve. There is mild thickening of the aortic valve. There is mild aortic valve annular calcification. Aortic valve regurgitation is mild. No aortic stenosis is present. Aortic valve mean gradient measures 4.9 mmHg. Aortic valve peak gradient measures 8.1 mmHg. Aortic valve area, by VTI measures 2.97 cm. Pulmonic Valve: The pulmonic valve was not well visualized. Pulmonic valve regurgitation is not visualized. No evidence of pulmonic stenosis. Aorta: Aortic  dilatation noted. There is mild dilatation of the aortic root, measuring 43 mm. Venous: The inferior vena cava is normal in size with greater than 50% respiratory variability, suggesting right atrial pressure of 3 mmHg. IAS/Shunts: No atrial level shunt detected by color flow Doppler.  LEFT VENTRICLE PLAX 2D LVIDd:         4.60 cm   Diastology LVIDs:         3.00 cm   LV e' medial:    7.07 cm/s LV PW:         1.20 cm   LV E/e' medial:  7.3 LV IVS:        1.40 cm   LV e' lateral:   10.40 cm/s LVOT diam:     2.20 cm   LV E/e' lateral: 5.0 LV SV:         88 LV SV Index:   37 LVOT Area:     3.80 cm  RIGHT  VENTRICLE RV S prime:     13.20 cm/s TAPSE (M-mode): 2.4 cm LEFT ATRIUM             Index        RIGHT ATRIUM           Index LA diam:        3.60 cm 1.50 cm/m   RA Area:     21.90 cm LA Vol (A2C):   54.8 ml 22.79 ml/m  RA Volume:   65.00 ml  27.04 ml/m LA Vol (A4C):   49.2 ml 20.44 ml/m LA Biplane Vol: 62.0 ml 25.79 ml/m  AORTIC VALVE AV Area (Vmax):    3.26 cm AV Area (Vmean):   2.84 cm AV Area (VTI):     2.97 cm AV Vmax:           141.88 cm/s AV Vmean:          105.234 cm/s AV VTI:            0.296 m AV Peak Grad:      8.1 mmHg AV Mean Grad:      4.9 mmHg LVOT Vmax:         121.50 cm/s LVOT Vmean:        78.600 cm/s LVOT VTI:          0.232 m LVOT/AV VTI ratio: 0.78  AORTA Ao Root diam: 4.30 cm MITRAL VALVE MV Area (PHT): 2.76 cm    SHUNTS MV Decel Time: 275 msec    Systemic VTI:  0.23 m MV E velocity: 51.70 cm/s  Systemic Diam: 2.20 cm MV A velocity: 66.20 cm/s MV E/A ratio:  0.78 Carlyle Dolly MD Electronically signed by Carlyle Dolly MD Signature Date/Time: 10/04/2022/1:00:24 PM    Final      Assessment/Plan: Right mid ureteral stones with obstruction.  I discussed MET, URS and ESWL with him and he is most interested in ESWL.  The stones are visible in the midureter on KUB today so I will get him set up for ESWL next week.   Risks of bleeding, infection, injury to adjacent structures, failure  to fragment or progression of fragments with the need for secondary procedures and sedation risks reviewed.     No orders of the defined types were placed in this encounter.    Orders Placed This Encounter  Procedures   Microscopic Examination   DG Abd 1 View    Order Specific Question:   Reason for Exam (SYMPTOM  OR DIAGNOSIS REQUIRED)    Answer:   left ureteral stone    Order Specific Question:   Preferred imaging location?    Answer:   [facility]    Order Specific Question:   Radiology Contrast Protocol - do NOT remove file path    Answer:   [email or web address]   Urinalysis, Routine w reflex microscopic     Return for Next available Lithotripsy. .    CC: Dr. Redmond School.      Irine Seal 11/29/2022

## 2022-11-28 NOTE — Telephone Encounter (Signed)
Ok to hold Aspirin 7 days prior to procedure and restart when feasible from a bleeding standpoint after.  Dr. Debara Pickett

## 2022-11-28 NOTE — Progress Notes (Signed)
Subjective: 1. Ureteral calculus, right   2. Hydronephrosis with urinary obstruction due to ureteral calculus      [name] is a 67 yo WM who was in the ER on Sunday with right flank pain that was severe with nausea.   He had microhematuria.  He was found to have 2 stones in the left lower proximal/mid ureter with obstruction. The largest was 10x25m.   He last took a pain pill this morning.   He has had prior ESWL remotely.  He had a spermatocelectomy in 2015 by Dr. OKarsten Ro  His has some frequency but no urgency.  His IPSS is 11.  He was hospitalized in July for dehydration and has drinking increased fluid.  He doesn't feel he empties all the way.  His Cr was 1.72 on 11/24/22 which is up from 1.23 in 11/23.   His UA had 50 RBC's.  He had a syncopal episode in 11/23 and was evaluated by cardiology and he was found to have CAD with marked coronary calcification.  He had mild Aortic insufficiency.   The stones are visible in the right mid ureter on KUB today.   His UA has 3-10 RBC's.   ROS:  Review of Systems  HENT:  Positive for congestion.   Respiratory:  Positive for cough and shortness of breath.   Gastrointestinal:  Positive for constipation.  All other systems reviewed and are negative.   No Known Allergies  Past Medical History:  Diagnosis Date   History of kidney stones    Hypertension    Hypertriglyceridemia    OSA on CPAP    POST  SLEEP APNEA SURGERY --- PER SLEEP STUDY 06-26-15-2006  MODERATE OSA   USES CPAP DAILY   Renal disorder    Scrotal swelling    Shortness of breath dyspnea    with exertion   Urgency of urination    OCCASIONAL    Past Surgical History:  Procedure Laterality Date   APPENDECTOMY     CHOLECYSTECTOMY N/A 08/07/2018   Procedure: LAPAROSCOPIC CHOLECYSTECTOMY;  Surgeon: JAviva Signs MD;  Location: [facility];  Service: General;  Laterality: N/A;   COLONOSCOPY     COLONOSCOPY N/A 06/05/2018   Procedure: COLONOSCOPY;  Surgeon: RDaneil Dolin MD;  [facility];  Service: Endoscopy;  Laterality: N/A;  7:30   DISTAL BICEPS TENDON REPAIR Right 01/30/2015   Procedure: DISTAL BICEPS TENDON REPAIR;  Surgeon: MMarybelle Killings MD;  [facility]  Service: Orthopedics;  Laterality: Right;   EXPLORATION LAPAROTOMY/  PARTIAL COLECTOMY WITH RESECTION TERMINAL ILEUM (APPENDICEAL MASS)  01-16-2010   BENIGN NEOPLASM APPENDIX   HYDROCELE EXCISION Right 03/18/2014   Procedure:  RIGHT SPERMACELECTOMY;  Surgeon: MClaybon Jabs MD;  [facility]  Service: Urology;  Laterality: Right;   KNEE ARTHROSCOPY Right 2010   LITHOTRIPSY     LIVER BIOPSY  08/07/2018   Procedure: LIVER BIOPSY;  Surgeon: JAviva Signs MD;  Location: [facility];  Service: General;;   POLYPECTOMY  06/05/2018   Procedure: POLYPECTOMY;  Surgeon: RDaneil Dolin MD;  [facility];  Service: Endoscopy;;  ascending and descending   TONSILLECTOMY     UVULOPALATOPHARYNGOPLASTY  2000 (APPROX)   W/  DEVIATED SEPTUM REPAIR  AND TONSILLECTOMY    Social History   Socioeconomic History   Marital status: Married    Spouse name: Not on file   Number of children: Not on file   Years of education: Not on file  Highest education level: Not on file  Occupational History   Not on file  Tobacco Use   Smoking status: Never   Smokeless tobacco: Never  Vaping Use   Vaping Use: Never used  Substance and Sexual Activity   Alcohol use: Not Currently    Comment: RARE   Drug use: No   Sexual activity: Not on file  Other Topics Concern   Not on file  Social History Narrative   Not on file   Social Determinants of Health   Financial Resource Strain: Low Risk  (07/04/2022)   Overall Financial Resource Strain (CARDIA)    Difficulty of Paying Living Expenses: Not hard at all  Food Insecurity: No Food Insecurity (07/04/2022)   Hunger Vital Sign    Worried About Running Out of Food in the Last Year: Never true    Ran Out of Food in the Last Year: Never true   Transportation Needs: No Transportation Needs (07/04/2022)   PRAPARE - Hydrologist (Medical): No    Lack of Transportation (Non-Medical): No  Physical Activity: Not on file  Stress: Not on file  Social Connections: Not on file  Intimate Partner Violence: Not on file    History reviewed. No pertinent family history.  Anti-infectives: Anti-infectives (From admission, onward)    None       Current Outpatient Medications  Medication Sig Dispense Refill   aspirin EC 81 MG tablet Take 1 tablet (81 mg total) by mouth daily. Swallow whole. 90 tablet 3   fenofibrate 160 MG tablet Take 160 mg by mouth daily.      ketoconazole (NIZORAL) 2 % cream daily.     olmesartan (BENICAR) 40 MG tablet Take 40 mg by mouth daily.     Omega-3 Fatty Acids (FISH OIL) 1200 MG CAPS Take 1,200 mg by mouth daily.     ondansetron (ZOFRAN-ODT) 4 MG disintegrating tablet '4mg'$  ODT q4 hours prn nausea/vomit 10 tablet 0   oxyCODONE-acetaminophen (PERCOCET/ROXICET) 5-325 MG tablet Take 1 tablet by mouth every 6 (six) hours as needed for severe pain. 20 tablet 0   oxyCODONE-acetaminophen (PERCOCET/ROXICET) 5-325 MG tablet Take 1 tablet by mouth every 6 (six) hours as needed for severe pain. 6 tablet 0   tamsulosin (FLOMAX) 0.4 MG CAPS capsule Take 1 capsule (0.4 mg total) by mouth daily. 10 capsule 0   traZODone (DESYREL) 50 MG tablet Take 50 mg by mouth at bedtime.     celecoxib (CELEBREX) 200 MG capsule Take 200 mg by mouth 2 (two) times daily as needed. (Patient not taking: Reported on 11/28/2022)     rosuvastatin (CRESTOR) 5 MG tablet Take 1 tablet (5 mg total) by mouth daily. 90 tablet 3   No current facility-administered medications for this visit.     Objective: Vital signs in last 24 hours: BP 106/70   Pulse (!) 106   Intake/Output from previous day: No intake/output data recorded. Intake/Output this shift: '@IOTHISSHIFT'$ @   Physical Exam Vitals reviewed.   Constitutional:      Appearance: Normal appearance.  Cardiovascular:     Rate and Rhythm: Normal rate and regular rhythm.     Heart sounds: Normal heart sounds.  Pulmonary:     Effort: Pulmonary effort is normal. No respiratory distress.     Breath sounds: Normal breath sounds.  Abdominal:     General: Abdomen is flat.  Musculoskeletal:        General: No swelling. Normal range of motion.  Skin:  General: Skin is warm and dry.  Neurological:     General: No focal deficit present.     Mental Status: He is alert and oriented to person, place, and time.     Lab Results:  No results found for this or any previous visit (from the past 24 hour(s)).   BMET No results for input(s): "NA", "K", "CL", "CO2", "GLUCOSE", "BUN", "CREATININE", "CALCIUM" in the last 72 hours. PT/INR No results for input(s): "LABPROT", "INR" in the last 72 hours. ABG No results for input(s): "PHART", "HCO3" in the last 72 hours.  Invalid input(s): "PCO2", "PO2" UA has 3-10 RBC's.  Results for orders placed or performed in visit on 11/28/22 (from the past 72 hour(s))  Urinalysis, Routine w reflex microscopic     Status: Abnormal   Collection Time: 11/28/22  8:54 AM  Result Value Ref Range   Specific Gravity, UA 1.020 1.005 - 1.030   pH, UA 5.5 5.0 - 7.5   Color, UA Yellow Yellow   Appearance Ur Clear Clear   Leukocytes,UA Negative Negative   Protein,UA Negative Negative/Trace   Glucose, UA Negative Negative   Ketones, UA Negative Negative   RBC, UA 2+ (A) Negative   Bilirubin, UA Negative Negative   Urobilinogen, Ur 2.0 (H) 0.2 - 1.0 mg/dL   Nitrite, UA Negative Negative   Microscopic Examination See below:   Microscopic Examination     Status: Abnormal   Collection Time: 11/28/22  8:54 AM   Urine  Result Value Ref Range   WBC, UA 0-5 0 - 5 /hpf   RBC, Urine 3-10 (A) 0 - 2 /hpf   Epithelial Cells (non renal) 0-10 0 - 10 /hpf   Casts Present (A) None seen /lpf   Cast Type Hyaline casts N/A    Bacteria, UA None seen None seen/Few    Studies/Results: DG Abd 1 View  Result Date: 11/28/2022 CLINICAL DATA:  Ureteral stone EXAM: ABDOMEN - 1 VIEW COMPARISON:  11/24/2022 CT scan FINDINGS: Bowel gas pattern unremarkable. Splenic artery atherosclerotic calcification noted. On the CT scan of 11/24/2022 there is a notable cluster of right ureteral calculi just below the level of the iliac crest. This appears to potentially have migrated slightly distally, currently projecting over the right sacrum, with the cluster measuring about 1.6 by 0.7 cm in total No other urinary tract calculi are identified. Lumbar spondylosis. IMPRESSION: 1. Cluster of right ureteral calculi appears to have migrated slightly distally in the right ureter, currently projecting over the right sacrum. 2. Splenic artery atherosclerotic calcification. 3. Lumbar spondylosis. Electronically Signed   By: Van Clines M.D.   On: 11/28/2022 10:06   DG Abd 1 View  Result Date: 11/28/2022 CLINICAL DATA:  Ureteral stone EXAM: ABDOMEN - 1 VIEW COMPARISON:  11/24/2022 CT scan FINDINGS: Bowel gas pattern unremarkable. Splenic artery atherosclerotic calcification noted. On the CT scan of 11/24/2022 there is a notable cluster of right ureteral calculi just below the level of the iliac crest. This appears to potentially have migrated slightly distally, currently projecting over the right sacrum, with the cluster measuring about 1.6 by 0.7 cm in total No other urinary tract calculi are identified. Lumbar spondylosis. IMPRESSION: 1. Cluster of right ureteral calculi appears to have migrated slightly distally in the right ureter, currently projecting over the right sacrum. 2. Splenic artery atherosclerotic calcification. 3. Lumbar spondylosis. Electronically Signed   By: Van Clines M.D.   On: 11/28/2022 10:06   CT Renal Stone Study  Result Date: 11/24/2022  CLINICAL DATA:  Right flank pain for a week, concern for kidney stone. EXAM: CT  ABDOMEN AND PELVIS WITHOUT CONTRAST TECHNIQUE: Multidetector CT imaging of the abdomen and pelvis was performed following the standard protocol without IV contrast. RADIATION DOSE REDUCTION: This exam was performed according to the departmental dose-optimization program which includes automated exposure control, adjustment of the mA and/or kV according to patient size and/or use of iterative reconstruction technique. COMPARISON:  CT abdomen pelvis dated 11/30/2010 FINDINGS: Lower chest: There is mild bibasilar atelectasis. Hepatobiliary: No focal liver abnormality is seen. Status post cholecystectomy. No biliary dilatation. Pancreas: Unremarkable. No pancreatic ductal dilatation or surrounding inflammatory changes. Spleen: Normal in size without focal abnormality. Adrenals/Urinary Tract: Adrenal glands are unremarkable. Two obstructing ureteral calculi in the midportion of the right ureter results in moderate right hydroureteronephrosis. The more distal calculus measures 10 x 8 mm and the more proximal calculus measures 7 x 6 mm in size. A nonobstructive right renal calculus measures 2 mm in size. A left renal cyst measures 2.9 cm. A nonobstructive left renal calculus measures 2 mm in size. Hydronephrosis on the left. The urinary bladder is unremarkable. Stomach/Bowel: Stomach is within normal limits. Postoperative changes are seen at the cecum. There is colonic diverticulosis without evidence of diverticulitis. No evidence of bowel wall thickening, distention, or inflammatory changes. Vascular/Lymphatic: Aortic atherosclerosis. No enlarged abdominal or pelvic lymph nodes. Reproductive: The prostate is enlarged, measuring 5.3 cm in transverse dimension. Other: No abdominal wall hernia or abnormality. No abdominopelvic ascites. Musculoskeletal: Moderate degenerative changes are seen in the spine. 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 IMPRESSION: 1. Two obstructing calculi in  the midportion of the right ureter result in moderate right hydroureteronephrosis. 2. 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 Aortic Atherosclerosis (ICD10-I70.0). Electronically Signed   By: Zerita Boers M.D.   On: 11/24/2022 15:50   CT CORONARY FFR DATA PREP & FLUID ANALYSIS  Result Date: 10/18/2022 EXAM: CT FFR ANALYSIS MEDICATIONS: MEDICATIONS None FINDINGS: CT FFR analysis was performed on the original cardiac computed tomography angiogram dataset. Diagrammatic representation of the CT FFR analysis is provided in a separate PDF document in PACS. This dictation was created using the PDF document and an interactive 3D model of the results. The 3D model is not available in the EMR/PACS. Normal CT FFR range is >0.80. 1. Left Main: No significant stenosis. 2. LAD: FFR 0.89 mLAD. 3. LCX: FFR 0.96 mLCx 4. RCA: FFR 0.71 distal PLV branch of RCA. IMPRESSION: 1. Hemodynamically significant stenosis in the proximal PLV branch of the RCA. Loralie Champagne, MD Electronically Signed   By: Loralie Champagne M.D.   On: 10/18/2022 16:34   CT CORONARY MORPH W/CTA COR W/SCORE W/CA W/CM &/OR WO/CM  Addendum Date: 10/17/2022   ADDENDUM REPORT: 10/17/2022 17:38 CLINICAL DATA:  Chest pain EXAM: Cardiac CTA MEDICATIONS: Sub lingual nitro. '4mg'$  x 2 TECHNIQUE: The patient was scanned on a Siemens 390 slice scanner. Gantry rotation speed was 250 msecs. Collimation was 0.6 mm. A 100 kV prospective scan was triggered in the ascending thoracic aorta at 35-75% of the R-R interval. Average HR during the scan was 60 bpm. The 3D data set was interpreted on a dedicated work station using MPR, MIP and VRT modes. A total of 80cc of contrast was used. FINDINGS: Non-cardiac: See separate report from [facility]. Pulmonary veins drain normally to the left atrium. No LA appendage thrombus. Calcium Score:  4017 Agatston units. Coronary Arteries: Right dominant with no anomalies LM: No plaque or stenosis. LAD  system: Extensive calcified plaque proximal LAD, suspect moderate (60-69%) stenosis. FFR 0.89 mid LAD, this is not suggestive of hemodynamic significance. Circumflex system: Mixed plaque proximal and mid LCx. Suspect moderate (50-69%) proximal LCx stenosis. FFR 0.96 mid LCx, this is not suggestive of hemodynamic significance. RCA system: Extensive calcified plaque in the RCA, no more than mild (1-24%) stenosis. Extensive mixed plaque in a moderate PLV branch. In the proximal PLV, there was an area of noncalcified plaque with severe (70-99%) stenosis. FFR 0.71 distal PLV. IMPRESSION: 1. Coronary artery calcium score 4017 Agatston units. This places the patient in the 99th percentile for age and gender, suggesting high risk for future cardiac events. 2. Severe stenosis proximal PLV branch of the RCA with noncalcified plaque. This stenosis was hemodynamically significant by FFR. 3. Extensive calcified plaque in the proximal LAD and proximal LCx. Stenosis appeared moderate visually in the proximal LAD and LCx but was not hemodynamically significant by FFR. Dalton Mclean Electronically Signed   By: Loralie Champagne M.D.   On: 10/17/2022 17:38   Result Date: 10/17/2022 EXAM: OVER-READ INTERPRETATION  CT CHEST The following report is an over-read performed by radiologist Dr. Ruthann Cancer of [facility], [place] on 10/17/2022. This over-read does not include interpretation of cardiac or coronary anatomy or pathology. The coronary CTA interpretation by the cardiologist is attached. COMPARISON:  09/17/2021 FINDINGS: Cardiovascular: The heart is normal in size. Mediastinum/Nodes: Visualized esophagus and trachea within normal limits. No mediastinal lymphadenopathy. Lungs/Pleura: The lungs are clear bilaterally. No suspicious pulmonary nodules. Upper Abdomen: The visualized upper abdomen is within normal limits. Musculoskeletal: No acute osseous abnormality. Mild multilevel degenerative changes of the visualized thoracic  spine. IMPRESSION: No acute or significant extracardiac abnormality. Ruthann Cancer, MD [facility] Electronically Signed: By: Ruthann Cancer M.D. On: 10/17/2022 14:17   ECHOCARDIOGRAM COMPLETE  Result Date: 10/04/2022    ECHOCARDIOGRAM REPORT   Patient Name:   [name] Date of Exam: 10/04/2022 Medical Rec #:  [id number]        Height:       74.0 in Accession #:    [id number]       Weight:       253.6 lb Date of Birth:  07-30-1956        BSA:          2.404 m Patient Age:    34 years         BP:           120/74 mmHg Patient Gender: M                HR:           75 bpm. Exam Location:  [facility] Procedure: 2D Echo, Cardiac Doppler and Color Doppler Indications:    I35.1 (ICD-10-CM) - Aortic valve insufficiency, etiology of                 cardiac valve disease unspecified, R06.09 (ICD-10-CM) - Dyspnea                 on exertion, R53.83 (ICD-10-CM) - Fatigue, unspecified type,                 R07.9 (ICD-10-CM) - Chest pain, unspecified type  History:        Patient has prior history of Echocardiogram examinations, most  recent 09/06/2021. Risk Factors:Hypertension and Dyslipidemia.                 Hx of COVID-19, OSA on CPAP (From Hx).  Sonographer:    Alvino Chapel RCS Referring Phys: [place]  1. Left ventricular ejection fraction, by estimation, is 60 to 65%. The left ventricle has normal function. The left ventricle has no regional wall motion abnormalities. There is moderate left ventricular hypertrophy. Left ventricular diastolic parameters are consistent with Grade I diastolic dysfunction (impaired relaxation).  2. Right ventricular systolic function is normal. The right ventricular size is normal.  3. The mitral valve is normal in structure. No evidence of mitral valve regurgitation. No evidence of mitral stenosis.  4. The aortic valve is tricuspid. There is mild calcification of the aortic valve.  There is mild thickening of the aortic valve. Aortic valve regurgitation is mild. No aortic stenosis is present.  5. Aortic dilatation noted. There is mild dilatation of the aortic root, measuring 43 mm.  6. The inferior vena cava is normal in size with greater than 50% respiratory variability, suggesting right atrial pressure of 3 mmHg. FINDINGS  Left Ventricle: Left ventricular ejection fraction, by estimation, is 60 to 65%. The left ventricle has normal function. The left ventricle has no regional wall motion abnormalities. The left ventricular internal cavity size was normal in size. There is  moderate left ventricular hypertrophy. Left ventricular diastolic parameters are consistent with Grade I diastolic dysfunction (impaired relaxation). Normal left ventricular filling pressure. Right Ventricle: The right ventricular size is normal. No increase in right ventricular wall thickness. Right ventricular systolic function is normal. Left Atrium: Left atrial size was normal in size. Right Atrium: Right atrial size was normal in size. Pericardium: There is no evidence of pericardial effusion. Mitral Valve: The mitral valve is normal in structure. No evidence of mitral valve regurgitation. No evidence of mitral valve stenosis. Tricuspid Valve: The tricuspid valve is normal in structure. Tricuspid valve regurgitation is not demonstrated. No evidence of tricuspid stenosis. Aortic Valve: The aortic valve is tricuspid. There is mild calcification of the aortic valve. There is mild thickening of the aortic valve. There is mild aortic valve annular calcification. Aortic valve regurgitation is mild. No aortic stenosis is present. Aortic valve mean gradient measures 4.9 mmHg. Aortic valve peak gradient measures 8.1 mmHg. Aortic valve area, by VTI measures 2.97 cm. Pulmonic Valve: The pulmonic valve was not well visualized. Pulmonic valve regurgitation is not visualized. No evidence of pulmonic stenosis. Aorta: Aortic  dilatation noted. There is mild dilatation of the aortic root, measuring 43 mm. Venous: The inferior vena cava is normal in size with greater than 50% respiratory variability, suggesting right atrial pressure of 3 mmHg. IAS/Shunts: No atrial level shunt detected by color flow Doppler.  LEFT VENTRICLE PLAX 2D LVIDd:         4.60 cm   Diastology LVIDs:         3.00 cm   LV e' medial:    7.07 cm/s LV PW:         1.20 cm   LV E/e' medial:  7.3 LV IVS:        1.40 cm   LV e' lateral:   10.40 cm/s LVOT diam:     2.20 cm   LV E/e' lateral: 5.0 LV SV:         88 LV SV Index:   37 LVOT Area:     3.80 cm  RIGHT  VENTRICLE RV S prime:     13.20 cm/s TAPSE (M-mode): 2.4 cm LEFT ATRIUM             Index        RIGHT ATRIUM           Index LA diam:        3.60 cm 1.50 cm/m   RA Area:     21.90 cm LA Vol (A2C):   54.8 ml 22.79 ml/m  RA Volume:   65.00 ml  27.04 ml/m LA Vol (A4C):   49.2 ml 20.44 ml/m LA Biplane Vol: 62.0 ml 25.79 ml/m  AORTIC VALVE AV Area (Vmax):    3.26 cm AV Area (Vmean):   2.84 cm AV Area (VTI):     2.97 cm AV Vmax:           141.88 cm/s AV Vmean:          105.234 cm/s AV VTI:            0.296 m AV Peak Grad:      8.1 mmHg AV Mean Grad:      4.9 mmHg LVOT Vmax:         121.50 cm/s LVOT Vmean:        78.600 cm/s LVOT VTI:          0.232 m LVOT/AV VTI ratio: 0.78  AORTA Ao Root diam: 4.30 cm MITRAL VALVE MV Area (PHT): 2.76 cm    SHUNTS MV Decel Time: 275 msec    Systemic VTI:  0.23 m MV E velocity: 51.70 cm/s  Systemic Diam: 2.20 cm MV A velocity: 66.20 cm/s MV E/A ratio:  0.78 Carlyle Dolly MD Electronically signed by Carlyle Dolly MD Signature Date/Time: 10/04/2022/1:00:24 PM    Final      Assessment/Plan: Right mid ureteral stones with obstruction.  I discussed MET, URS and ESWL with him and he is most interested in ESWL.  The stones are visible in the midureter on KUB today so I will get him set up for ESWL next week.   Risks of bleeding, infection, injury to adjacent structures, failure  to fragment or progression of fragments with the need for secondary procedures and sedation risks reviewed.     No orders of the defined types were placed in this encounter.    Orders Placed This Encounter  Procedures   Microscopic Examination   DG Abd 1 View    Order Specific Question:   Reason for Exam (SYMPTOM  OR DIAGNOSIS REQUIRED)    Answer:   left ureteral stone    Order Specific Question:   Preferred imaging location?    [facility]    Order Specific Question:   Radiology Contrast Protocol - do NOT remove file path    Answer:   [email or web address]   Urinalysis, Routine w reflex microscopic     Return for Next available Lithotripsy. .    CC: Dr. Redmond School.      Irine Seal 11/29/2022

## 2022-11-28 NOTE — Telephone Encounter (Signed)
   Primary Cardiologist: Pixie Casino, MD  Chart reviewed as part of pre-operative protocol coverage. Given past medical history and time since last visit, based on ACC/AHA guidelines, MITHRAN STRIKE would be at acceptable risk for the planned procedure without further cardiovascular testing.   Spoke with patient who reports he is not having any chest pain, dyspnea, palpitations, or other concerning cardiac symptoms. He is feeling well. Patient was advised that if he develops new symptoms prior to surgery to contact our office to arrange a follow-up appointment. He verbalized understanding and thanked me for the call.  Advised that he may hold Aspirin for 5-7 days prior to procedure.   I will route this recommendation to the requesting party via Epic fax function and remove from pre-op pool.  Please call with questions.  Emmaline Life, NP-C  11/28/2022, 1:00 PM [address], [address] Office [phone] Fax [phone]

## 2022-11-28 NOTE — Telephone Encounter (Signed)
        Patient  visited [facility] on 1/7   Telephone encounter attempt :  1st  Unable to leave a message     [place] [place] [facility]  [phone] [address], [place], [place] [address] Phone: [phone] Email: [email or web address]

## 2022-11-28 NOTE — Telephone Encounter (Signed)
   Pre-operative Risk Assessment    Patient Name: [name]  DOB: [birth year] MRN: [id number]      Request for Surgical Clearance    Procedure:   ESWL  Date of Surgery:  Clearance TBD                                 Surgeon:  DR. Irine Seal Surgeon's Group or Practice Name:  [facility] Phone number:  [phone] Fax number:  [phone]   Type of Clearance Requested:   - Medical ; ASA   Type of Anesthesia:  Local    Additional requests/questions:    Jiles Prows   11/28/2022, 12:13 PM

## 2022-12-02 ENCOUNTER — Telehealth: Payer: Self-pay

## 2022-12-02 NOTE — Telephone Encounter (Signed)
I spoke with [name]. We have discussed possible surgery dates and 12/17/2022 was agreed upon by all parties. Patient given information about surgery date, what to expect pre-operatively and post operatively. Patient is out of town next week and requested 2 weeks out.    We discussed that a pre-op nurse will be calling to set up the pre-op visit that will take place prior to surgery. Informed patient that our office will communicate any additional care to be provided after surgery.    Patients questions or concerns were discussed during our call. Advised to call our office should there be any additional information, questions or concerns that arise. Patient verbalized understanding.    Approval from Dr. Debara Pickett to hold ASA 5 days prior- discussed ASA hold with patient and voiced understanding.  Patient will come into office prior to ESWL and we will review packet for surgery.

## 2022-12-04 DIAGNOSIS — K080 Exfoliation of teeth due to systemic causes: Secondary | ICD-10-CM | POA: Diagnosis not present

## 2022-12-05 ENCOUNTER — Other Ambulatory Visit: Payer: Self-pay | Admitting: Internal Medicine

## 2022-12-05 DIAGNOSIS — I77810 Thoracic aortic ectasia: Secondary | ICD-10-CM

## 2022-12-05 DIAGNOSIS — I351 Nonrheumatic aortic (valve) insufficiency: Secondary | ICD-10-CM

## 2022-12-05 NOTE — Telephone Encounter (Signed)
ESWL instructions reviewed with patient in office today.

## 2022-12-13 ENCOUNTER — Encounter (HOSPITAL_COMMUNITY)
Admission: RE | Admit: 2022-12-13 | Discharge: 2022-12-13 | Disposition: A | Discharge status: Home / Self Care | Payer: Medicare Other | Source: Ambulatory Visit | Attending: Urology | Admitting: Urology

## 2022-12-17 ENCOUNTER — Encounter (HOSPITAL_COMMUNITY)
Admission: RE | Disposition: A | Discharge status: Home / Self Care | Payer: Self-pay | Source: Home / Self Care | Attending: Urology

## 2022-12-17 ENCOUNTER — Ambulatory Visit (HOSPITAL_COMMUNITY)
Admission: RE | Admit: 2022-12-17 | Discharge: 2022-12-17 | Disposition: A | Discharge status: Home / Self Care | Payer: Medicare Other | Attending: Urology | Admitting: Urology

## 2022-12-17 ENCOUNTER — Encounter (HOSPITAL_COMMUNITY): Payer: Self-pay | Admitting: Urology

## 2022-12-17 ENCOUNTER — Ambulatory Visit (HOSPITAL_COMMUNITY): Payer: Medicare Other

## 2022-12-17 DIAGNOSIS — Z87442 Personal history of urinary calculi: Secondary | ICD-10-CM | POA: Diagnosis not present

## 2022-12-17 DIAGNOSIS — M47816 Spondylosis without myelopathy or radiculopathy, lumbar region: Secondary | ICD-10-CM | POA: Insufficient documentation

## 2022-12-17 DIAGNOSIS — E781 Pure hyperglyceridemia: Secondary | ICD-10-CM | POA: Insufficient documentation

## 2022-12-17 DIAGNOSIS — I10 Essential (primary) hypertension: Secondary | ICD-10-CM | POA: Insufficient documentation

## 2022-12-17 DIAGNOSIS — N132 Hydronephrosis with renal and ureteral calculous obstruction: Secondary | ICD-10-CM | POA: Diagnosis not present

## 2022-12-17 DIAGNOSIS — Z79899 Other long term (current) drug therapy: Secondary | ICD-10-CM | POA: Insufficient documentation

## 2022-12-17 DIAGNOSIS — N138 Other obstructive and reflux uropathy: Secondary | ICD-10-CM | POA: Insufficient documentation

## 2022-12-17 DIAGNOSIS — G4733 Obstructive sleep apnea (adult) (pediatric): Secondary | ICD-10-CM | POA: Diagnosis not present

## 2022-12-17 DIAGNOSIS — I700 Atherosclerosis of aorta: Secondary | ICD-10-CM | POA: Insufficient documentation

## 2022-12-17 DIAGNOSIS — I2510 Atherosclerotic heart disease of native coronary artery without angina pectoris: Secondary | ICD-10-CM | POA: Diagnosis not present

## 2022-12-17 DIAGNOSIS — Z9049 Acquired absence of other specified parts of digestive tract: Secondary | ICD-10-CM | POA: Insufficient documentation

## 2022-12-17 DIAGNOSIS — K5730 Diverticulosis of large intestine without perforation or abscess without bleeding: Secondary | ICD-10-CM | POA: Insufficient documentation

## 2022-12-17 DIAGNOSIS — I878 Other specified disorders of veins: Secondary | ICD-10-CM | POA: Diagnosis not present

## 2022-12-17 DIAGNOSIS — N201 Calculus of ureter: Secondary | ICD-10-CM | POA: Diagnosis not present

## 2022-12-17 DIAGNOSIS — M4317 Spondylolisthesis, lumbosacral region: Secondary | ICD-10-CM | POA: Diagnosis not present

## 2022-12-17 HISTORY — PX: EXTRACORPOREAL SHOCK WAVE LITHOTRIPSY: SHX1557

## 2022-12-17 SURGERY — LITHOTRIPSY, ESWL
Anesthesia: LOCAL | Laterality: Right

## 2022-12-17 MED ORDER — TAMSULOSIN HCL 0.4 MG PO CAPS: 0.4000 mg | ORAL_CAPSULE | Freq: Every day | ORAL | 0 refills | Status: DC

## 2022-12-17 MED ORDER — DIPHENHYDRAMINE HCL 25 MG PO CAPS
25.0000 mg | ORAL_CAPSULE | ORAL | Status: AC
  Administered 2022-12-17: 25 mg via ORAL
  Filled 2022-12-17 (×2): qty 1

## 2022-12-17 MED ORDER — ONDANSETRON 4 MG PO TBDP: ORAL_TABLET | ORAL | 0 refills | Status: DC

## 2022-12-17 MED ORDER — OXYCODONE-ACETAMINOPHEN 5-325 MG PO TABS: 1.0000 | ORAL_TABLET | Freq: Four times a day (QID) | ORAL | 0 refills | Status: DC | PRN

## 2022-12-17 MED ORDER — DIAZEPAM 5 MG PO TABS
10.0000 mg | ORAL_TABLET | Freq: Once | ORAL | Status: AC
  Administered 2022-12-17: 10 mg via ORAL
  Filled 2022-12-17: qty 2

## 2022-12-17 MED ORDER — SODIUM CHLORIDE 0.9 % IV SOLN
INTRAVENOUS | Status: DC
  Administered 2022-12-17: 1000 mL via INTRAVENOUS

## 2022-12-17 NOTE — Interval H&P Note (Signed)
History and Physical Interval Note:  12/17/2022 7:42 AM  [name]  has presented today for surgery, with the diagnosis of Right ureteral stones.  The various methods of treatment have been discussed with the patient and family. After consideration of risks, benefits and other options for treatment, the patient has consented to  Procedure(s): EXTRACORPOREAL SHOCK WAVE LITHOTRIPSY (ESWL) (Right) as a surgical intervention.  The patient's history has been reviewed, patient examined, no change in status, stable for surgery.  I have reviewed the patient's chart and labs.  Questions were answered to the patient's satisfaction.     Nicolette Bang

## 2022-12-19 ENCOUNTER — Telehealth: Payer: Self-pay

## 2022-12-19 ENCOUNTER — Encounter (HOSPITAL_COMMUNITY): Payer: Self-pay | Admitting: Urology

## 2022-12-19 NOTE — Telephone Encounter (Signed)
Patient called stating midday yesterday he started to have pain at the base of his penis and right testicle pain.  Reviewed complaints with Dr. Jeffie Pollock and he advised pt is getting ready to pass pieces of his stone. Patient advised to increase fluids and to call office back if pain becomes unbearable or if pt is unable to void.  Pt voiced understanding.

## 2022-12-28 DIAGNOSIS — J101 Influenza due to other identified influenza virus with other respiratory manifestations: Secondary | ICD-10-CM | POA: Diagnosis not present

## 2022-12-28 DIAGNOSIS — R059 Cough, unspecified: Secondary | ICD-10-CM | POA: Diagnosis not present

## 2023-01-08 ENCOUNTER — Ambulatory Visit (HOSPITAL_COMMUNITY)
Admission: RE | Admit: 2023-01-08 | Discharge: 2023-01-08 | Disposition: A | Discharge status: Home / Self Care | Payer: Medicare Other | Source: Ambulatory Visit | Attending: Urology | Admitting: Urology

## 2023-01-08 ENCOUNTER — Other Ambulatory Visit: Payer: Self-pay

## 2023-01-08 DIAGNOSIS — N201 Calculus of ureter: Secondary | ICD-10-CM

## 2023-01-08 DIAGNOSIS — I878 Other specified disorders of veins: Secondary | ICD-10-CM | POA: Diagnosis not present

## 2023-01-09 ENCOUNTER — Ambulatory Visit: Payer: Medicare Other | Admitting: Urology

## 2023-01-09 VITALS — BP 107/71 | HR 80 | Ht 74.0 in | Wt 245.0 lb

## 2023-01-09 DIAGNOSIS — N132 Hydronephrosis with renal and ureteral calculous obstruction: Secondary | ICD-10-CM

## 2023-01-09 DIAGNOSIS — N201 Calculus of ureter: Secondary | ICD-10-CM | POA: Diagnosis not present

## 2023-01-09 LAB — URINALYSIS, ROUTINE W REFLEX MICROSCOPIC
Bilirubin, UA: NEGATIVE
Glucose, UA: NEGATIVE
Ketones, UA: NEGATIVE
Leukocytes,UA: NEGATIVE
Nitrite, UA: NEGATIVE
Protein,UA: NEGATIVE
Specific Gravity, UA: 1.02 (ref 1.005–1.030)
Urobilinogen, Ur: 0.2 mg/dL (ref 0.2–1.0)
pH, UA: 5 (ref 5.0–7.5)

## 2023-01-09 LAB — MICROSCOPIC EXAMINATION: Bacteria, UA: NONE SEEN

## 2023-01-09 NOTE — Progress Notes (Signed)
Subjective: 1. Ureteral calculus, right   2. Hydronephrosis with urinary obstruction due to ureteral calculus      01/09/23: [name] had right ureteral ESWL on 12/18/22.  He hasn't see a lot pass but did see a few flecks.  He had some suprapubic pain on 12/19/22 and has had no pain since then.  He has no significant LUTS or hematuria.  His IPSS today is 1.    11/28/22: [name] is a 67 yo WM who was in the ER on Sunday with right flank pain that was severe with nausea.   He had microhematuria.  He was found to have 2 stones in the left lower proximal/mid ureter with obstruction. The largest was 10x71m.   He last took a pain pill this morning.   He has had prior ESWL remotely.  He had a spermatocelectomy in 2015 by Dr. OKarsten Ro  His has some frequency but no urgency.  His IPSS is 11.  He was hospitalized in July for dehydration and has drinking increased fluid.  He doesn't feel he empties all the way.  His Cr was 1.72 on 11/24/22 which is up from 1.23 in 11/23.   His UA had 50 RBC's.  He had a syncopal episode in 11/23 and was evaluated by cardiology and he was found to have CAD with marked coronary calcification.  He had mild Aortic insufficiency.  The stones are visible in the right mid ureter on KUB today.   His UA has 3-10 RBC's.   ROS:  Review of Systems  HENT:  Positive for congestion.   All other systems reviewed and are negative.   No Known Allergies  Past Medical History:  Diagnosis Date   History of kidney stones    Hypertension    Hypertriglyceridemia    OSA on CPAP    POST  SLEEP APNEA SURGERY --- PER SLEEP STUDY 06-26-15-2006  MODERATE OSA   USES CPAP DAILY   Renal disorder    Scrotal swelling    Shortness of breath dyspnea    with exertion   Urgency of urination    OCCASIONAL    Past Surgical History:  Procedure Laterality Date   APPENDECTOMY     CHOLECYSTECTOMY N/A 08/07/2018   Procedure: LAPAROSCOPIC CHOLECYSTECTOMY;  Surgeon: JAviva Signs MD;  Location: [facility];  Service:  General;  Laterality: N/A;   COLONOSCOPY     COLONOSCOPY N/A 06/05/2018   Procedure: COLONOSCOPY;  Surgeon: RDaneil Dolin MD;  Location: [facility];  Service: Endoscopy;  Laterality: N/A;  7:30   DISTAL BICEPS TENDON REPAIR Right 01/30/2015   Procedure: DISTAL BICEPS TENDON REPAIR;  Surgeon: MMarybelle Killings MD;  [facility]  Service: Orthopedics;  Laterality: Right;   EXPLORATION LAPAROTOMY/  PARTIAL COLECTOMY WITH RESECTION TERMINAL ILEUM (APPENDICEAL MASS)  01-16-2010   BENIGN NEOPLASM APPENDIX   EXTRACORPOREAL SHOCK WAVE LITHOTRIPSY Right 12/17/2022   Procedure: EXTRACORPOREAL SHOCK WAVE LITHOTRIPSY (ESWL);  Surgeon: MCleon Gustin MD;  Location: [facility];  Service: Urology;  Laterality: Right;   HYDROCELE EXCISION Right 03/18/2014   Procedure:  RIGHT SPERMACELECTOMY;  Surgeon: MClaybon Jabs MD;  [facility]  Service: Urology;  Laterality: Right;   KNEE ARTHROSCOPY Right 2010   LITHOTRIPSY     LIVER BIOPSY  08/07/2018   Procedure: LIVER BIOPSY;  Surgeon: JAviva Signs MD;  Location: [facility];  Service: General;;   POLYPECTOMY  06/05/2018   Procedure: POLYPECTOMY;  Surgeon: RDaneil Dolin MD;  Location: [facility];  Service: Endoscopy;;  ascending and descending   TONSILLECTOMY     UVULOPALATOPHARYNGOPLASTY  2000 (APPROX)   W/  DEVIATED SEPTUM REPAIR  AND TONSILLECTOMY    Social History   Socioeconomic History   Marital status: Married    Spouse name: Not on file   Number of children: Not on file   Years of education: Not on file   Highest education level: Not on file  Occupational History   Not on file  Tobacco Use   Smoking status: Never   Smokeless tobacco: Never  Vaping Use   Vaping Use: Never used  Substance and Sexual Activity   Alcohol use: Not Currently    Comment: RARE   Drug use: No   Sexual activity: Not on file  Other Topics Concern   Not on file  Social History Narrative   Not on file   Social Determinants of Health    Financial Resource Strain: Low Risk  (07/04/2022)   Overall Financial Resource Strain (CARDIA)    Difficulty of Paying Living Expenses: Not hard at all  Food Insecurity: No Food Insecurity (07/04/2022)   Hunger Vital Sign    Worried About Running Out of Food in the Last Year: Never true    Ran Out of Food in the Last Year: Never true  Transportation Needs: No Transportation Needs (07/04/2022)   PRAPARE - Hydrologist (Medical): No    Lack of Transportation (Non-Medical): No  Physical Activity: Not on file  Stress: Not on file  Social Connections: Not on file  Intimate Partner Violence: Not on file    No family history on file.  Anti-infectives: Anti-infectives (From admission, onward)    None       Current Outpatient Medications  Medication Sig Dispense Refill   aspirin EC 81 MG tablet Take 1 tablet (81 mg total) by mouth daily. Swallow whole. 90 tablet 3   celecoxib (CELEBREX) 200 MG capsule Take 200 mg by mouth 2 (two) times daily as needed.     fenofibrate 160 MG tablet Take 160 mg by mouth daily.      ketoconazole (NIZORAL) 2 % cream daily.     olmesartan (BENICAR) 40 MG tablet Take 40 mg by mouth daily.     Omega-3 Fatty Acids (FISH OIL) 1200 MG CAPS Take 1,200 mg by mouth daily.     ondansetron (ZOFRAN-ODT) 4 MG disintegrating tablet '4mg'$  ODT q4 hours prn nausea/vomit 30 tablet 0   oxyCODONE-acetaminophen (PERCOCET/ROXICET) 5-325 MG tablet Take 1 tablet by mouth every 6 (six) hours as needed for severe pain. 6 tablet 0   oxyCODONE-acetaminophen (PERCOCET/ROXICET) 5-325 MG tablet Take 1 tablet by mouth every 6 (six) hours as needed for severe pain. 30 tablet 0   tamsulosin (FLOMAX) 0.4 MG CAPS capsule Take 1 capsule (0.4 mg total) by mouth daily. 30 capsule 0   traZODone (DESYREL) 50 MG tablet Take 50 mg by mouth at bedtime.     rosuvastatin (CRESTOR) 5 MG tablet Take 1 tablet (5 mg total) by mouth daily. 90 tablet 3   No current  facility-administered medications for this visit.     Objective: Vital signs in last 24 hours: BP 107/71   Pulse 80   Ht '6\' 2"'$  (1.88 m)   Wt 245 lb (111.1 kg)   BMI 31.46 kg/m   Intake/Output from previous day: No intake/output data recorded. Intake/Output this shift: '@IOTHISSHIFT'$ @   Physical Exam Vitals reviewed.  Constitutional:  Appearance: Normal appearance.  Neurological:     Mental Status: He is alert.     Lab Results:  Results for orders placed or performed in visit on 01/09/23 (from the past 24 hour(s))  Urinalysis, Routine w reflex microscopic     Status: Abnormal   Collection Time: 01/09/23 11:52 AM  Result Value Ref Range   Specific Gravity, UA 1.020 1.005 - 1.030   pH, UA 5.0 5.0 - 7.5   Color, UA Yellow Yellow   Appearance Ur Clear Clear   Leukocytes,UA Negative Negative   Protein,UA Negative Negative/Trace   Glucose, UA Negative Negative   Ketones, UA Negative Negative   RBC, UA 2+ (A) Negative   Bilirubin, UA Negative Negative   Urobilinogen, Ur 0.2 0.2 - 1.0 mg/dL   Nitrite, UA Negative Negative   Microscopic Examination See below:    Narrative   Performed at:  [facility] [address] [place], [place]  [id number] Lab Director: Mina Marble MT, Phone:  [phone]  Microscopic Examination     Status: Abnormal   Collection Time: 01/09/23 11:52 AM   Urine  Result Value Ref Range   WBC, UA 0-5 0 - 5 /hpf   RBC, Urine 3-10 (A) 0 - 2 /hpf   Epithelial Cells (non renal) 0-10 0 - 10 /hpf   Bacteria, UA None seen None seen/Few   Narrative   Performed at:  [facility], [place], [place]  [id number] Lab Director: [place], Phone:  [phone]     BMET No results for input(s): "NA", "K", "CL", "CO2", "GLUCOSE", "BUN", "CREATININE", "CALCIUM" in the last 72 hours. PT/INR No results for input(s): "LABPROT", "INR" in the last 72 hours. ABG No results for input(s): "PHART", "HCO3" in the  last 72 hours.  Invalid input(s): "PCO2", "PO2" UA has 3-10 RBC's.  Results for orders placed or performed in visit on 01/09/23 (from the past 72 hour(s))  Urinalysis, Routine w reflex microscopic     Status: Abnormal   Collection Time: 01/09/23 11:52 AM  Result Value Ref Range   Specific Gravity, UA 1.020 1.005 - 1.030   pH, UA 5.0 5.0 - 7.5   Color, UA Yellow Yellow   Appearance Ur Clear Clear   Leukocytes,UA Negative Negative   Protein,UA Negative Negative/Trace   Glucose, UA Negative Negative   Ketones, UA Negative Negative   RBC, UA 2+ (A) Negative   Bilirubin, UA Negative Negative   Urobilinogen, Ur 0.2 0.2 - 1.0 mg/dL   Nitrite, UA Negative Negative   Microscopic Examination See below:     Comment: Microscopic was indicated and was performed.  Microscopic Examination     Status: Abnormal   Collection Time: 01/09/23 11:52 AM   Urine  Result Value Ref Range   WBC, UA 0-5 0 - 5 /hpf   RBC, Urine 3-10 (A) 0 - 2 /hpf   Epithelial Cells (non renal) 0-10 0 - 10 /hpf   Bacteria, UA None seen None seen/Few     Studies/Results: KUB done 01/08/23 is not read but I don't see any stone material.   DG Abd 1 View  Result Date: 01/09/2023 CLINICAL DATA:  Kidney stone post lithotripsy on RIGHT EXAM: ABDOMEN - 1 VIEW COMPARISON:  12/17/2022 FINDINGS: No renal calculi identified. No calculi seen along the courses of the ureters. Question calculus in the RIGHT ureter overlying the RIGHT sacrum on the previous exam no longer identified. Small pelvic phleboliths noted. Bowel gas  pattern normal. Surgical clips RIGHT upper quadrant likely reflect prior cholecystectomy. Osseous demineralization with degenerative disc disease changes thoracolumbar spine. IMPRESSION: No urinary tract calculi identified. Electronically Signed   By: Lavonia Dana M.D.   On: 01/09/2023 17:36   DG Abd 1 View  Result Date: 01/09/2023 CLINICAL DATA:  Kidney stone post lithotripsy on RIGHT EXAM: ABDOMEN - 1 VIEW  COMPARISON:  12/17/2022 FINDINGS: No renal calculi identified. No calculi seen along the courses of the ureters. Question calculus in the RIGHT ureter overlying the RIGHT sacrum on the previous exam no longer identified. Small pelvic phleboliths noted. Bowel gas pattern normal. Surgical clips RIGHT upper quadrant likely reflect prior cholecystectomy. Osseous demineralization with degenerative disc disease changes thoracolumbar spine. IMPRESSION: No urinary tract calculi identified. Electronically Signed   By: Lavonia Dana M.D.   On: 01/09/2023 17:36   DG Abd 1 View  Result Date: 12/17/2022 CLINICAL DATA:  Ureteral calculus. EXAM: ABDOMEN - 1 VIEW COMPARISON:  November 28, 2022.  November 24, 2022. FINDINGS: The bowel gas pattern is normal. Status post cholecystectomy. Multiple small phleboliths are noted in the pelvis. Distal right ureteral calculi noted on prior exam still appear to be projected over the right sacrum. IMPRESSION: No abnormal bowel dilatation. Distal right ureteral calculi noted on prior exam still appear to be projected over right sacrum. Electronically Signed   By: Marijo Conception M.D.   On: 12/17/2022 08:08   DG Abd 1 View  Result Date: 11/28/2022 CLINICAL DATA:  Ureteral stone EXAM: ABDOMEN - 1 VIEW COMPARISON:  11/24/2022 CT scan FINDINGS: Bowel gas pattern unremarkable. Splenic artery atherosclerotic calcification noted. On the CT scan of 11/24/2022 there is a notable cluster of right ureteral calculi just below the level of the iliac crest. This appears to potentially have migrated slightly distally, currently projecting over the right sacrum, with the cluster measuring about 1.6 by 0.7 cm in total No other urinary tract calculi are identified. Lumbar spondylosis. IMPRESSION: 1. Cluster of right ureteral calculi appears to have migrated slightly distally in the right ureter, currently projecting over the right sacrum. 2. Splenic artery atherosclerotic calcification. 3. Lumbar spondylosis.  Electronically Signed   By: Van Clines M.D.   On: 11/28/2022 10:06   CT Renal Stone Study  Result Date: 11/24/2022 CLINICAL DATA:  Right flank pain for a week, concern for kidney stone. EXAM: CT ABDOMEN AND PELVIS WITHOUT CONTRAST TECHNIQUE: Multidetector CT imaging of the abdomen and pelvis was performed following the standard protocol without IV contrast. RADIATION DOSE REDUCTION: This exam was performed according to the departmental dose-optimization program which includes automated exposure control, adjustment of the mA and/or kV according to patient size and/or use of iterative reconstruction technique. COMPARISON:  CT abdomen pelvis dated 11/30/2010 FINDINGS: Lower chest: There is mild bibasilar atelectasis. Hepatobiliary: No focal liver abnormality is seen. Status post cholecystectomy. No biliary dilatation. Pancreas: Unremarkable. No pancreatic ductal dilatation or surrounding inflammatory changes. Spleen: Normal in size without focal abnormality. Adrenals/Urinary Tract: Adrenal glands are unremarkable. Two obstructing ureteral calculi in the midportion of the right ureter results in moderate right hydroureteronephrosis. The more distal calculus measures 10 x 8 mm and the more proximal calculus measures 7 x 6 mm in size. A nonobstructive right renal calculus measures 2 mm in size. A left renal cyst measures 2.9 cm. A nonobstructive left renal calculus measures 2 mm in size. Hydronephrosis on the left. The urinary bladder is unremarkable. Stomach/Bowel: Stomach is within normal limits. Postoperative changes are seen at  the cecum. There is colonic diverticulosis without evidence of diverticulitis. No evidence of bowel wall thickening, distention, or inflammatory changes. Vascular/Lymphatic: Aortic atherosclerosis. No enlarged abdominal or pelvic lymph nodes. Reproductive: The prostate is enlarged, measuring 5.3 cm in transverse dimension. Other: No abdominal wall hernia or abnormality. No  abdominopelvic ascites. Musculoskeletal: Moderate degenerative changes are seen in the spine. 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 IMPRESSION: 1. Two obstructing calculi in the midportion of the right ureter result in moderate right hydroureteronephrosis. 2. 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 Aortic Atherosclerosis (ICD10-I70.0). Electronically Signed   By: Zerita Boers M.D.   On: 11/24/2022 15:50   CT CORONARY FFR DATA PREP & FLUID ANALYSIS  Result Date: 10/18/2022 EXAM: CT FFR ANALYSIS MEDICATIONS: MEDICATIONS None FINDINGS: CT FFR analysis was performed on the original cardiac computed tomography angiogram dataset. Diagrammatic representation of the CT FFR analysis is provided in a separate PDF document in PACS. This dictation was created using the PDF document and an interactive 3D model of the results. The 3D model is not available in the EMR/PACS. Normal CT FFR range is >0.80. 1. Left Main: No significant stenosis. 2. LAD: FFR 0.89 mLAD. 3. LCX: FFR 0.96 mLCx 4. RCA: FFR 0.71 distal PLV branch of RCA. IMPRESSION: 1. Hemodynamically significant stenosis in the proximal PLV branch of the RCA. Loralie Champagne, MD Electronically Signed   By: Loralie Champagne M.D.   On: 10/18/2022 16:34   CT CORONARY MORPH W/CTA COR W/SCORE W/CA W/CM &/OR WO/CM  Addendum Date: 10/17/2022   ADDENDUM REPORT: 10/17/2022 17:38 CLINICAL DATA:  Chest pain EXAM: Cardiac CTA MEDICATIONS: Sub lingual nitro. '4mg'$  x 2 TECHNIQUE: The patient was scanned on a Siemens [id number] slice scanner. Gantry rotation speed was 250 msecs. Collimation was 0.6 mm. A 100 kV prospective scan was triggered in the ascending thoracic aorta at 35-75% of the R-R interval. Average HR during the scan was 60 bpm. The 3D data set was interpreted on a dedicated work station using MPR, MIP and VRT modes. A total of 80cc of contrast was used. FINDINGS: Non-cardiac: See separate report from  [facility]. Pulmonary veins drain normally to the left atrium. No LA appendage thrombus. Calcium Score: 4017 Agatston units. Coronary Arteries: Right dominant with no anomalies LM: No plaque or stenosis. LAD system: Extensive calcified plaque proximal LAD, suspect moderate (60-69%) stenosis. FFR 0.89 mid LAD, this is not suggestive of hemodynamic significance. Circumflex system: Mixed plaque proximal and mid LCx. Suspect moderate (50-69%) proximal LCx stenosis. FFR 0.96 mid LCx, this is not suggestive of hemodynamic significance. RCA system: Extensive calcified plaque in the RCA, no more than mild (1-24%) stenosis. Extensive mixed plaque in a moderate PLV branch. In the proximal PLV, there was an area of noncalcified plaque with severe (70-99%) stenosis. FFR 0.71 distal PLV. IMPRESSION: 1. Coronary artery calcium score 4017 Agatston units. This places the patient in the 99th percentile for age and gender, suggesting high risk for future cardiac events. 2. Severe stenosis proximal PLV branch of the RCA with noncalcified plaque. This stenosis was hemodynamically significant by FFR. 3. Extensive calcified plaque in the proximal LAD and proximal LCx. Stenosis appeared moderate visually in the proximal LAD and LCx but was not hemodynamically significant by FFR. [name] Electronically Signed   By: Loralie Champagne M.D.   On: 10/17/2022 17:38   Result Date: 10/17/2022 EXAM: OVER-READ INTERPRETATION  CT CHEST The following  report is an over-read performed by radiologist Dr. Ruthann Cancer of [facility], [place] on 10/17/2022. This over-read does not include interpretation of cardiac or coronary anatomy or pathology. The coronary CTA interpretation by the cardiologist is attached. COMPARISON:  09/17/2021 FINDINGS: Cardiovascular: The heart is normal in size. Mediastinum/Nodes: Visualized esophagus and trachea within normal limits. No mediastinal lymphadenopathy. Lungs/Pleura: The lungs are clear  bilaterally. No suspicious pulmonary nodules. Upper Abdomen: The visualized upper abdomen is within normal limits. Musculoskeletal: No acute osseous abnormality. Mild multilevel degenerative changes of the visualized thoracic spine. IMPRESSION: No acute or significant extracardiac abnormality. Ruthann Cancer, MD [facility] Electronically Signed: By: Ruthann Cancer M.D. On: 10/17/2022 14:17      Assessment/Plan: Right mid ureteral stones with obstruction.  He is doing well s/p ESWL but hasn't seen much pass and still has microhematuria.   I will get a repeat KUB and RUS in a month and have him return with the results.     No orders of the defined types were placed in this encounter.    Orders Placed This Encounter  Procedures   Microscopic Examination   DG Abd 1 View    Standing Status:   Future    Standing Expiration Date:   04/09/2023    Order Specific Question:   Reason for Exam (SYMPTOM  OR DIAGNOSIS REQUIRED)    Answer:   right ureteral stone    Order Specific Question:   Preferred imaging location?    Answer:   [facility]    Order Specific Question:   Radiology Contrast Protocol - do NOT remove file path    Answer:   [email or web address]   US RENAL    Standing Status:   Future    Standing Expiration Date:   04/09/2023    Order Specific Question:   Reason for Exam (SYMPTOM  OR DIAGNOSIS REQUIRED)    Answer:   right ureteral stone    Order Specific Question:   Preferred imaging location?    Answer:   [facility]   Urinalysis, Routine w reflex microscopic     Return in about 4 weeks (around 02/06/2023) for with KUB and RUS.    CC: Dr. Redmond School.      Irine Seal 01/10/2023

## 2023-01-10 ENCOUNTER — Encounter: Payer: Self-pay | Admitting: Urology

## 2023-01-10 DIAGNOSIS — K080 Exfoliation of teeth due to systemic causes: Secondary | ICD-10-CM | POA: Diagnosis not present

## 2023-01-14 ENCOUNTER — Other Ambulatory Visit: Payer: Self-pay | Admitting: Urology

## 2023-01-31 DIAGNOSIS — M170 Bilateral primary osteoarthritis of knee: Secondary | ICD-10-CM | POA: Diagnosis not present

## 2023-02-07 ENCOUNTER — Ambulatory Visit (HOSPITAL_COMMUNITY)
Admission: RE | Admit: 2023-02-07 | Discharge: 2023-02-07 | Disposition: A | Discharge status: Home / Self Care | Payer: Medicare Other | Source: Ambulatory Visit | Attending: Urology | Admitting: Urology

## 2023-02-07 DIAGNOSIS — N201 Calculus of ureter: Secondary | ICD-10-CM | POA: Diagnosis not present

## 2023-02-07 DIAGNOSIS — N281 Cyst of kidney, acquired: Secondary | ICD-10-CM | POA: Diagnosis not present

## 2023-02-07 DIAGNOSIS — N132 Hydronephrosis with renal and ureteral calculous obstruction: Secondary | ICD-10-CM | POA: Diagnosis not present

## 2023-02-10 ENCOUNTER — Other Ambulatory Visit: Payer: Self-pay | Admitting: Internal Medicine

## 2023-02-24 DIAGNOSIS — H5213 Myopia, bilateral: Secondary | ICD-10-CM | POA: Diagnosis not present

## 2023-02-27 ENCOUNTER — Encounter: Payer: Self-pay | Admitting: Urology

## 2023-02-27 ENCOUNTER — Ambulatory Visit: Payer: Medicare Other | Admitting: Urology

## 2023-02-27 ENCOUNTER — Ambulatory Visit (HOSPITAL_COMMUNITY)
Admission: RE | Admit: 2023-02-27 | Discharge: 2023-02-27 | Disposition: A | Discharge status: Home / Self Care | Payer: Medicare Other | Source: Ambulatory Visit | Attending: Urology | Admitting: Urology

## 2023-02-27 VITALS — BP 119/74 | HR 91 | Ht 74.0 in | Wt 245.0 lb

## 2023-02-27 DIAGNOSIS — N201 Calculus of ureter: Secondary | ICD-10-CM

## 2023-02-27 DIAGNOSIS — N281 Cyst of kidney, acquired: Secondary | ICD-10-CM

## 2023-02-27 DIAGNOSIS — R3129 Other microscopic hematuria: Secondary | ICD-10-CM

## 2023-02-27 DIAGNOSIS — N200 Calculus of kidney: Secondary | ICD-10-CM

## 2023-02-27 DIAGNOSIS — Z87442 Personal history of urinary calculi: Secondary | ICD-10-CM

## 2023-02-27 LAB — URINALYSIS, ROUTINE W REFLEX MICROSCOPIC
Bilirubin, UA: NEGATIVE
Glucose, UA: NEGATIVE
Ketones, UA: NEGATIVE
Leukocytes,UA: NEGATIVE
Nitrite, UA: NEGATIVE
Protein,UA: NEGATIVE
Specific Gravity, UA: 1.02 (ref 1.005–1.030)
Urobilinogen, Ur: 0.2 mg/dL (ref 0.2–1.0)
pH, UA: 5 (ref 5.0–7.5)

## 2023-02-27 LAB — MICROSCOPIC EXAMINATION: Bacteria, UA: NONE SEEN

## 2023-02-27 NOTE — Progress Notes (Signed)
Subjective: 1. Ureteral calculus, right   2. Renal stones   3. Renal cyst    02/27/23: [name] returns today in f/u.  He has no more pain or hematuria.  He had no obstruction on a renal US and the KUB today is clear.  He has a left renal cyst.  His IPSS is 1.  UA has 3-10 RBC's.    01/09/23: [name] had right ureteral ESWL on 12/18/22.  He hasn't see a lot pass but did see a few flecks.  He had some suprapubic pain on 12/19/22 and has had no pain since then.  He has no significant LUTS or hematuria.  His IPSS today is 1.    11/28/22: [name] is a 67 yo WM who was in the ER on Sunday with right flank pain that was severe with nausea.   He had microhematuria.  He was found to have 2 stones in the left lower proximal/mid ureter with obstruction. The largest was 10x15mm.   He last took a pain pill this morning.   He has had prior ESWL remotely.  He had a spermatocelectomy in 2015 by Dr. Vernie Ammons.  His has some frequency but no urgency.  His IPSS is 11.  He was hospitalized in July for dehydration and has drinking increased fluid.  He doesn't feel he empties all the way.  His Cr was 1.72 on 11/24/22 which is up from 1.23 in 11/23.   His UA had 50 RBC's.  He had a syncopal episode in 11/23 and was evaluated by cardiology and he was found to have CAD with marked coronary calcification.  He had mild Aortic insufficiency.  The stones are visible in the right mid ureter on KUB today.   His UA has 3-10 RBC's.   ROS:  Review of Systems  HENT:  Positive for congestion.   All other systems reviewed and are negative.   No Known Allergies  Past Medical History:  Diagnosis Date   History of kidney stones    Hypertension    Hypertriglyceridemia    OSA on CPAP    POST  SLEEP APNEA SURGERY --- PER SLEEP STUDY 06-26-15-2006  MODERATE OSA   USES CPAP DAILY   Renal disorder    Scrotal swelling    Shortness of breath dyspnea    with exertion   Urgency of urination    OCCASIONAL    Past Surgical History:  Procedure  Laterality Date   APPENDECTOMY     CHOLECYSTECTOMY N/A 08/07/2018   Procedure: LAPAROSCOPIC CHOLECYSTECTOMY;  Surgeon: Franky Macho, MD;  Location: [facility];  Service: General;  Laterality: N/A;   COLONOSCOPY     COLONOSCOPY N/A 06/05/2018   Procedure: COLONOSCOPY;  Surgeon: Corbin Ade, MD;  [facility];  Service: Endoscopy;  Laterality: N/A;  7:30   DISTAL BICEPS TENDON REPAIR Right 01/30/2015   Procedure: DISTAL BICEPS TENDON REPAIR;  Surgeon: Eldred Manges, MD;  [facility]: [facility];  Service: Orthopedics;  Laterality: Right;   EXPLORATION LAPAROTOMY/  PARTIAL COLECTOMY WITH RESECTION TERMINAL ILEUM (APPENDICEAL MASS)  01-16-2010   BENIGN NEOPLASM APPENDIX   EXTRACORPOREAL SHOCK WAVE LITHOTRIPSY Right 12/17/2022   Procedure: EXTRACORPOREAL SHOCK WAVE LITHOTRIPSY (ESWL);  Surgeon: Malen Gauze, MD;  Location: [facility];  Service: Urology;  Laterality: Right;   HYDROCELE EXCISION Right 03/18/2014   Procedure:  RIGHT SPERMACELECTOMY;  Surgeon: Garnett Farm, MD;  [facility];  Service: Urology;  Laterality: Right;   KNEE ARTHROSCOPY Right 2010  LITHOTRIPSY     LIVER BIOPSY  08/07/2018   Procedure: LIVER BIOPSY;  Surgeon: Franky Macho, MD;  Location: [facility];  Service: General;;   POLYPECTOMY  06/05/2018   Procedure: POLYPECTOMY;  Surgeon: Corbin Ade, MD;  [facility];  Service: Endoscopy;;  ascending and descending   TONSILLECTOMY     UVULOPALATOPHARYNGOPLASTY  2000 (APPROX)   W/  DEVIATED SEPTUM REPAIR  AND TONSILLECTOMY    Social History   Socioeconomic History   Marital status: Married    Spouse name: Not on file   Number of children: Not on file   Years of education: Not on file   Highest education level: Not on file  Occupational History   Not on file  Tobacco Use   Smoking status: Never   Smokeless tobacco: Never  Vaping Use   Vaping Use: Never used  Substance and Sexual Activity   Alcohol use: Not Currently     Comment: RARE   Drug use: No   Sexual activity: Not on file  Other Topics Concern   Not on file  Social History Narrative   Not on file   Social Determinants of Health   Financial Resource Strain: Low Risk  (07/04/2022)   Overall Financial Resource Strain (CARDIA)    Difficulty of Paying Living Expenses: Not hard at all  Food Insecurity: No Food Insecurity (07/04/2022)   Hunger Vital Sign    Worried About Running Out of Food in the Last Year: Never true    Ran Out of Food in the Last Year: Never true  Transportation Needs: No Transportation Needs (07/04/2022)   PRAPARE - Administrator, Civil Service (Medical): No    Lack of Transportation (Non-Medical): No  Physical Activity: Not on file  Stress: Not on file  Social Connections: Not on file  Intimate Partner Violence: Not on file    History reviewed. No pertinent family history.  Anti-infectives: Anti-infectives (From admission, onward)    None       Current Outpatient Medications  Medication Sig Dispense Refill   aspirin EC 81 MG tablet Take 1 tablet (81 mg total) by mouth daily. Swallow whole. 90 tablet 3   fenofibrate 160 MG tablet Take 160 mg by mouth daily.      ketoconazole (NIZORAL) 2 % cream daily.     olmesartan (BENICAR) 40 MG tablet Take 40 mg by mouth daily.     Omega-3 Fatty Acids (FISH OIL) 1200 MG CAPS Take 1,200 mg by mouth daily.     rosuvastatin (CRESTOR) 5 MG tablet TAKE 1 TABLET(5 MG) BY MOUTH DAILY 90 tablet 2   tamsulosin (FLOMAX) 0.4 MG CAPS capsule TAKE 1 CAPSULE(0.4 MG) BY MOUTH DAILY 90 capsule 3   traZODone (DESYREL) 50 MG tablet Take 50 mg by mouth at bedtime.     No current facility-administered medications for this visit.     Objective: Vital signs in last 24 hours: BP 119/74   Pulse 91   Ht 6\' 2"  (1.88 m)   Wt 245 lb (111.1 kg)   BMI 31.46 kg/m   Intake/Output from previous day: No intake/output data recorded. Intake/Output this shift: @IOTHISSHIFT @   Physical  Exam Vitals reviewed.  Constitutional:      Appearance: Normal appearance.  Neurological:     Mental Status: He is alert.     Lab Results:  No results found for this or any previous visit (from the past 24 hour(s)).    BMET No results  for input(s): "NA", "K", "CL", "CO2", "GLUCOSE", "BUN", "CREATININE", "CALCIUM" in the last 72 hours. PT/INR No results for input(s): "LABPROT", "INR" in the last 72 hours. ABG No results for input(s): "PHART", "HCO3" in the last 72 hours.  Invalid input(s): "PCO2", "PO2" UA has 3-10 RBC's.  No results found for this or any previous visit (from the past 72 hour(s)).    Studies/Results: KUB done 01/08/23 is not read but I don't see any stone material.   No results found. US RENAL  Result Date: 02/07/2023 CLINICAL DATA:  Right ureteral stone follow-up EXAM: RENAL / URINARY TRACT ULTRASOUND COMPLETE COMPARISON:  None Available. FINDINGS: Right Kidney: Renal measurements: 13 x 7.6 x 6.3 cm = volume: 321 mL. Echogenicity within normal limits. No mass or hydronephrosis visualized. Left Kidney: Renal measurements: 14.3 x 6 x 5.6 cm = volume: 251 mL. Contains multiple cysts with the largest measuring 2.9 cm. No follow-up imaging recommended for the cysts. Bladder: Appears normal for degree of bladder distention. Other: None. IMPRESSION: No significant abnormalities. No hydronephrosis. Electronically Signed   By: Gerome Sam III M.D.   On: 02/07/2023 16:43   DG Abd 1 View  Result Date: 01/09/2023 CLINICAL DATA:  Kidney stone post lithotripsy on RIGHT EXAM: ABDOMEN - 1 VIEW COMPARISON:  12/17/2022 FINDINGS: No renal calculi identified. No calculi seen along the courses of the ureters. Question calculus in the RIGHT ureter overlying the RIGHT sacrum on the previous exam no longer identified. Small pelvic phleboliths noted. Bowel gas pattern normal. Surgical clips RIGHT upper quadrant likely reflect prior cholecystectomy. Osseous demineralization with  degenerative disc disease changes thoracolumbar spine. IMPRESSION: No urinary tract calculi identified. Electronically Signed   By: Ulyses Southward M.D.   On: 01/09/2023 17:36   DG Abd 1 View  Result Date: 12/17/2022 CLINICAL DATA:  Ureteral calculus. EXAM: ABDOMEN - 1 VIEW COMPARISON:  November 28, 2022.  November 24, 2022. FINDINGS: The bowel gas pattern is normal. Status post cholecystectomy. Multiple small phleboliths are noted in the pelvis. Distal right ureteral calculi noted on prior exam still appear to be projected over the right sacrum. IMPRESSION: No abnormal bowel dilatation. Distal right ureteral calculi noted on prior exam still appear to be projected over right sacrum. Electronically Signed   By: Lupita Raider M.D.   On: 12/17/2022 08:08      Assessment/Plan: Right mid ureteral stones with obstruction.  He is doing well s/p ESWL and appears to be stone free.  F/u in a year with a KUB.  He remains on tamsulosin and is voiding well but I told him he could try to wean off of it.  Dietary instructions given.   Microhematuria.  He still has a few RBC's.  Renal cysts.  He has benign left renal cysts and I reassured him about those.     No orders of the defined types were placed in this encounter.    Orders Placed This Encounter  Procedures   Abdomen 1 view (KUB)    Standing Status:   Future    Standing Expiration Date:   02/27/2024    Order Specific Question:   Reason for Exam (SYMPTOM  OR DIAGNOSIS REQUIRED)    Answer:   renal stones    Order Specific Question:   Preferred imaging location?    Answer:   [facility]   Urinalysis, Routine w reflex microscopic     Return in about 1 year (around 02/27/2024) for with KUB.    CC: Dr.  Willey BladeLawrence Fusco.      Annayah Worthley 02/27/2023

## 2023-04-03 DIAGNOSIS — K080 Exfoliation of teeth due to systemic causes: Secondary | ICD-10-CM | POA: Diagnosis not present

## 2023-04-24 ENCOUNTER — Encounter: Payer: Self-pay | Admitting: Internal Medicine

## 2023-04-24 ENCOUNTER — Ambulatory Visit: Payer: Medicare Other | Attending: Internal Medicine | Admitting: Internal Medicine

## 2023-04-24 VITALS — BP 96/64 | HR 64 | Ht 74.0 in | Wt 260.0 lb

## 2023-04-24 DIAGNOSIS — R931 Abnormal findings on diagnostic imaging of heart and coronary circulation: Secondary | ICD-10-CM

## 2023-04-24 DIAGNOSIS — I351 Nonrheumatic aortic (valve) insufficiency: Secondary | ICD-10-CM

## 2023-04-24 DIAGNOSIS — E785 Hyperlipidemia, unspecified: Secondary | ICD-10-CM

## 2023-04-24 DIAGNOSIS — I77810 Thoracic aortic ectasia: Secondary | ICD-10-CM

## 2023-04-24 DIAGNOSIS — I959 Hypotension, unspecified: Secondary | ICD-10-CM

## 2023-04-24 MED ORDER — OLMESARTAN MEDOXOMIL 20 MG PO TABS: 20.0000 mg | ORAL_TABLET | Freq: Every day | ORAL | 3 refills | Status: DC

## 2023-04-24 NOTE — Progress Notes (Signed)
OFFICE NOTE  Chief Complaint:  Dizziness, low blood pressure  Primary Care Physician: Elfredia Nevins, MD  HPI:  [name] is a 67 y.o. male who had seen Dr. Eden Emms, with a past medial history significant for calcium scoring which was significantly abnormal showing a high calcium score of 2925, 98th percentile for age and sex matched controls.  He also had had exercise treadmill testing and an echocardiogram just prior to that as well which were only mildly abnormal.  He does have a history of dyslipidemia with very high triglycerides in the past but has been reasonably controlled on fenofibrate.  He was started on rosuvastatin 10 mg daily but he said he took it for 4 days and was nauseated every day.  He then stopped it and his symptoms resolved.  He was camping at the time and conceded that it may have been some other reason why he was nauseated.  He then discarded all the medication.  He is now referred for recommendations for further therapy.  His lipid profile in November 2022 showed total cholesterol 164, triglycerides 181, HDL 35 and LDL of 93.  His target LDL should be less than 70.  He also was noted to have some abnormal liver enzymes and based on ultrasound of the abdomen in 2019 does have hepatic steatosis.  Recent AST and ALT were 51 and 84 respectively.  He says he has been working hard on his diet but understands the importance of medication therapy given his high calcium score.  10/03/2022  [name] returns today for follow-up of his lipids.  He has had significant further improvement in his cholesterol.  His LDL particle numbers now 759, 8 LDL-C of 62, triglycerides are normal at 111.  Overall he seems to be doing well on his therapies.  What is concerning however is he has had worsening shortness of breath with exertion.  He has had decreased exercise intolerance and fatigue.  He reported his wife and daughter were quite concerned about him.  He says he has a 41-year-old  grandchild and has to stop and catch his breath often when doing normal activities or running around with him.  EKG shows a sinus bradycardia today with a right bundle branch block and 59.  As mentioned he had undergone an exercise tolerance test in October 2022 which was negative.  He had an echo as well which showed mild aortic insufficiency and a dilated aortic root to 43 mm.  10/21/2022  [name] is seen today for follow-up of his CT coronary angiogram.  Compared to his calcium score, which demonstrated a calcium score of 2925, the CT coronary angiogram performed about a month later shows a calcium score 4017 with severe stenosis of a proximal posterior lateral branch of the RCA and extensive multivessel coronary artery calcification with visually no more than moderate stenosis.  FFR did confirm that there was flow-limiting stenosis in the R-PLB branch.  He did not have any anginal symptoms, but has had shortness of breath.  04/24/2023  [name] is seen today for follow-up.  He reports over the past several months he has had issues with dizziness and low blood pressure.  He was actually hospitalized for dehydration and his olmesartan was stopped for several days.  He then restarted it as his blood pressure was rising.  Since then he has had episodes of intermittent dizziness and notably low blood pressure with blood pressures down into the 70s and max blood pressures of in  the low 120s.  Today's blood pressure is 96/64.  He denies any chest pain.  PMHx:  Past Medical History:  Diagnosis Date   History of kidney stones    Hypertension    Hypertriglyceridemia    OSA on CPAP    POST  SLEEP APNEA SURGERY --- PER SLEEP STUDY 06-26-15-2006  MODERATE OSA   USES CPAP DAILY   Renal disorder    Scrotal swelling    Shortness of breath dyspnea    with exertion   Urgency of urination    OCCASIONAL    Past Surgical History:  Procedure Laterality Date   APPENDECTOMY     CHOLECYSTECTOMY N/A  08/07/2018   Procedure: LAPAROSCOPIC CHOLECYSTECTOMY;  Surgeon: Franky Macho, MD;  Location: [facility];  Service: General;  Laterality: N/A;   COLONOSCOPY     COLONOSCOPY N/A 06/05/2018   Procedure: COLONOSCOPY;  Surgeon: Corbin Ade, MD;  [facility];  Service: Endoscopy;  Laterality: N/A;  7:30   DISTAL BICEPS TENDON REPAIR Right 01/30/2015   Procedure: DISTAL BICEPS TENDON REPAIR;  Surgeon: Eldred Manges, MD;  Location: [facility];  Service: Orthopedics;  Laterality: Right;   EXPLORATION LAPAROTOMY/  PARTIAL COLECTOMY WITH RESECTION TERMINAL ILEUM (APPENDICEAL MASS)  01-16-2010   BENIGN NEOPLASM APPENDIX   EXTRACORPOREAL SHOCK WAVE LITHOTRIPSY Right 12/17/2022   Procedure: EXTRACORPOREAL SHOCK WAVE LITHOTRIPSY (ESWL);  Surgeon: Malen Gauze, MD;  Location: [facility];  Service: Urology;  Laterality: Right;   HYDROCELE EXCISION Right 03/18/2014   Procedure:  RIGHT SPERMACELECTOMY;  Surgeon: Garnett Farm, MD;  Location[facility];  Service: Urology;  Laterality: Right;   KNEE ARTHROSCOPY Right 2010   LITHOTRIPSY     LIVER BIOPSY  08/07/2018   Procedure: LIVER BIOPSY;  Surgeon: Franky Macho, MD;  Location: [facility];  Service: General;;   POLYPECTOMY  06/05/2018   Procedure: POLYPECTOMY;  Surgeon: Corbin Ade, MD;  [facility];  Service: Endoscopy;;  ascending and descending   TONSILLECTOMY     UVULOPALATOPHARYNGOPLASTY  2000 (APPROX)   W/  DEVIATED SEPTUM REPAIR  AND TONSILLECTOMY    FAMHx:  No family history on file.  SOCHx:   reports that he has never smoked. He has never used smokeless tobacco. He reports that he does not currently use alcohol. He reports that he does not use drugs.  ALLERGIES:  No Known Allergies  ROS: Pertinent items noted in HPI and remainder of comprehensive ROS otherwise negative.  HOME MEDS: Current Outpatient Medications on File Prior to Visit  Medication Sig Dispense Refill   aspirin EC 81 MG tablet Take 1 tablet  (81 mg total) by mouth daily. Swallow whole. 90 tablet 3   fenofibrate 160 MG tablet Take 160 mg by mouth daily.      ketoconazole (NIZORAL) 2 % cream daily.     olmesartan (BENICAR) 40 MG tablet Take 40 mg by mouth daily.     Omega-3 Fatty Acids (FISH OIL) 1200 MG CAPS Take 1,200 mg by mouth daily.     rosuvastatin (CRESTOR) 5 MG tablet TAKE 1 TABLET(5 MG) BY MOUTH DAILY 90 tablet 2   tamsulosin (FLOMAX) 0.4 MG CAPS capsule TAKE 1 CAPSULE(0.4 MG) BY MOUTH DAILY 90 capsule 3   traZODone (DESYREL) 50 MG tablet Take 50 mg by mouth at bedtime.     No current facility-administered medications on file prior to visit.    LABS/IMAGING: No results found for this or any previous visit (from the  past 48 hour(s)). No results found.  LIPID PANEL:    Component Value Date/Time   CHOL 164 09/19/2021 0818   TRIG 181 (H) 09/19/2021 0818   HDL 35 (L) 09/19/2021 0818   CHOLHDL 4.7 09/19/2021 0818   VLDL 36 09/19/2021 0818   LDLCALC 93 09/19/2021 0818     WEIGHTS: Wt Readings from Last 3 Encounters:  04/24/23 260 lb (117.9 kg)  02/27/23 245 lb (111.1 kg)  01/09/23 245 lb (111.1 kg)    VITALS: BP 96/64 (BP Location: Left Arm, Patient Position: Sitting, Cuff Size: Large)   Pulse 64   Ht 6\' 2"  (1.88 m)   Wt 260 lb (117.9 kg)   SpO2 97%   BMI 33.38 kg/m   EXAM: General appearance: alert and no distress Neck: no carotid bruit, no JVD, and thyroid not enlarged, symmetric, no tenderness/mass/nodules Lungs: clear to auscultation bilaterally Heart: regular rate and rhythm, S1, S2 normal, and diastolic murmur: early diastolic 2/6, blowing at lower left sternal border Abdomen: soft, non-tender; bowel sounds normal; no masses,  no organomegaly Extremities: extremities normal, atraumatic, no cyanosis or edema Pulses: 2+ and symmetric Skin: Skin color, texture, turgor normal. No rashes or lesions Neurologic: Grossly normal Psych: Pleasant  EKG: Normal sinus rhythm with first-degree AV block at  64, RBBB-personally reviewed  ASSESSMENT: Dizziness and hypotension Progressive fatigue, dyspnea on exertion -high CAC score greater than 4000 with multivessel coronary calcification and flow-limiting stenosis of the R-PLB branch (09/2022) Mixed dyslipidemia, goal LDL less than 70 High CAC score of 2925, 98th percentile for age and sex matched controls History of high triglycerides Family history of heart disease in both parents  PLAN: 1.   [name] has been having recent episodes of dizziness and has been hypotensive on and off for the past several months but did not contact us about it.  He was admitted with apparent dehydration and was told to stop his blood pressure medication but then restarted it.  His blood pressure remains low and I advised him to cut the olmesartan back from 40 to 20 mg daily.  He should monitor blood pressure and reach out to [place] within the next 1 to 2 weeks as we may have to consider stopping it altogether.  Lipids seem to be well-controlled on current regimen including rosuvastatin and fenofibrate with an LDL 55 and triglycerides 179.  He denies any anginal symptoms and is on low-dose aspirin. Follow-up in a few months after a repeat echo, which is scheduled for follow-up of his AI.  Chrystie Nose, MD, [facility], FACP  [place]  [facility]  Medical Director of the [facility] Lip[facility]  Cardiovascular Risk Reduction Clinic Diplomate of the American Board of Clinical Lipidology Attending Cardiologist  Direct Dial: [phone]  Fax: [phone]  Website:  [email or web address] Nicely Lylee Corrow 04/24/2023, 9:23 AM

## 2023-04-24 NOTE — Patient Instructions (Signed)
Medication Instructions:  DECREASE olmesartan to 20mg  daily   *If you need a refill on your cardiac medications before your next appointment, please call your pharmacy*   Testing/Procedures: Echocardiogram -- due 10/2023   Follow-Up: At [facility], you and your health needs are our priority.  As part of our continuing mission to provide you with exceptional heart care, we have created designated Provider Care Teams.  These Care Teams include your primary Cardiologist (physician) and Advanced Practice Providers (APPs -  Physician Assistants and Nurse Practitioners) who all work together to provide you with the care you need, when you need it.  We recommend signing up for the patient portal called "MyChart".  Sign up information is provided on this After Visit Summary.  MyChart is used to connect with patients for Virtual Visits (Telemedicine).  Patients are able to view lab/test results, encounter notes, upcoming appointments, etc.  Non-urgent messages can be sent to your provider as well.   To learn more about what you can do with MyChart, go to [email or web address].    Your next appointment:    December 2024 -- after echo     Please check BP at home twice daily. Call or send a MyChart message with readings in about 7-10 days.  HOW TO TAKE YOUR BLOOD PRESSURE: Rest 5 minutes before taking your blood pressure. Don't smoke or drink caffeinated beverages for at least 30 minutes before. Take your blood pressure before (not after) you eat. Sit comfortably with your back supported and both feet on the floor (don't cross your legs). Elevate your arm to heart level on a table or a desk. Use the proper sized cuff. It should fit smoothly and snugly around your bare upper arm. There should be enough room to slip a fingertip under the cuff. The bottom edge of the cuff should be 1 inch above the crease of the elbow. Ideally, take 3 measurements at one sitting and record the  average.

## 2023-05-06 DIAGNOSIS — M170 Bilateral primary osteoarthritis of knee: Secondary | ICD-10-CM | POA: Diagnosis not present

## 2023-05-07 ENCOUNTER — Encounter: Payer: Self-pay | Admitting: Internal Medicine

## 2023-05-13 DIAGNOSIS — M170 Bilateral primary osteoarthritis of knee: Secondary | ICD-10-CM | POA: Diagnosis not present

## 2023-05-14 DIAGNOSIS — K080 Exfoliation of teeth due to systemic causes: Secondary | ICD-10-CM | POA: Diagnosis not present

## 2023-05-19 DIAGNOSIS — B351 Tinea unguium: Secondary | ICD-10-CM | POA: Diagnosis not present

## 2023-05-19 DIAGNOSIS — M79674 Pain in right toe(s): Secondary | ICD-10-CM | POA: Diagnosis not present

## 2023-05-19 DIAGNOSIS — M79675 Pain in left toe(s): Secondary | ICD-10-CM | POA: Diagnosis not present

## 2023-05-20 DIAGNOSIS — M170 Bilateral primary osteoarthritis of knee: Secondary | ICD-10-CM | POA: Diagnosis not present

## 2023-05-28 DIAGNOSIS — R600 Localized edema: Secondary | ICD-10-CM | POA: Diagnosis not present

## 2023-05-28 DIAGNOSIS — M5412 Radiculopathy, cervical region: Secondary | ICD-10-CM | POA: Diagnosis not present

## 2023-05-28 DIAGNOSIS — G4733 Obstructive sleep apnea (adult) (pediatric): Secondary | ICD-10-CM | POA: Diagnosis not present

## 2023-05-28 DIAGNOSIS — E6609 Other obesity due to excess calories: Secondary | ICD-10-CM | POA: Diagnosis not present

## 2023-05-28 DIAGNOSIS — I872 Venous insufficiency (chronic) (peripheral): Secondary | ICD-10-CM | POA: Diagnosis not present

## 2023-05-28 DIAGNOSIS — M179 Osteoarthritis of knee, unspecified: Secondary | ICD-10-CM | POA: Diagnosis not present

## 2023-05-28 DIAGNOSIS — M47812 Spondylosis without myelopathy or radiculopathy, cervical region: Secondary | ICD-10-CM | POA: Diagnosis not present

## 2023-05-28 DIAGNOSIS — Z6831 Body mass index (BMI) 31.0-31.9, adult: Secondary | ICD-10-CM | POA: Diagnosis not present

## 2023-05-28 DIAGNOSIS — I10 Essential (primary) hypertension: Secondary | ICD-10-CM | POA: Diagnosis not present

## 2023-07-01 DIAGNOSIS — M170 Bilateral primary osteoarthritis of knee: Secondary | ICD-10-CM | POA: Diagnosis not present

## 2023-07-14 DIAGNOSIS — M25562 Pain in left knee: Secondary | ICD-10-CM | POA: Diagnosis not present

## 2023-07-14 DIAGNOSIS — M1712 Unilateral primary osteoarthritis, left knee: Secondary | ICD-10-CM | POA: Diagnosis not present

## 2023-07-18 DIAGNOSIS — M25562 Pain in left knee: Secondary | ICD-10-CM | POA: Diagnosis not present

## 2023-07-18 DIAGNOSIS — M1712 Unilateral primary osteoarthritis, left knee: Secondary | ICD-10-CM | POA: Diagnosis not present

## 2023-07-22 DIAGNOSIS — M25562 Pain in left knee: Secondary | ICD-10-CM | POA: Diagnosis not present

## 2023-07-22 DIAGNOSIS — M1712 Unilateral primary osteoarthritis, left knee: Secondary | ICD-10-CM | POA: Diagnosis not present

## 2023-07-24 DIAGNOSIS — M25562 Pain in left knee: Secondary | ICD-10-CM | POA: Diagnosis not present

## 2023-07-24 DIAGNOSIS — M1712 Unilateral primary osteoarthritis, left knee: Secondary | ICD-10-CM | POA: Diagnosis not present

## 2023-07-28 ENCOUNTER — Encounter (HOSPITAL_COMMUNITY): Payer: Self-pay | Admitting: Emergency Medicine

## 2023-07-28 ENCOUNTER — Emergency Department (HOSPITAL_COMMUNITY)
Admission: EM | Admit: 2023-07-28 | Discharge: 2023-07-28 | Disposition: A | Discharge status: Home / Self Care | Payer: Medicare Other | Attending: Emergency Medicine | Admitting: Emergency Medicine

## 2023-07-28 ENCOUNTER — Emergency Department (HOSPITAL_COMMUNITY): Payer: Medicare Other

## 2023-07-28 ENCOUNTER — Other Ambulatory Visit: Payer: Self-pay

## 2023-07-28 DIAGNOSIS — I10 Essential (primary) hypertension: Secondary | ICD-10-CM | POA: Diagnosis not present

## 2023-07-28 DIAGNOSIS — R059 Cough, unspecified: Secondary | ICD-10-CM | POA: Diagnosis not present

## 2023-07-28 DIAGNOSIS — R509 Fever, unspecified: Secondary | ICD-10-CM | POA: Insufficient documentation

## 2023-07-28 DIAGNOSIS — D72829 Elevated white blood cell count, unspecified: Secondary | ICD-10-CM | POA: Diagnosis not present

## 2023-07-28 DIAGNOSIS — R197 Diarrhea, unspecified: Secondary | ICD-10-CM | POA: Insufficient documentation

## 2023-07-28 DIAGNOSIS — N281 Cyst of kidney, acquired: Secondary | ICD-10-CM | POA: Diagnosis not present

## 2023-07-28 DIAGNOSIS — Z9049 Acquired absence of other specified parts of digestive tract: Secondary | ICD-10-CM | POA: Diagnosis not present

## 2023-07-28 DIAGNOSIS — R109 Unspecified abdominal pain: Secondary | ICD-10-CM | POA: Insufficient documentation

## 2023-07-28 DIAGNOSIS — N200 Calculus of kidney: Secondary | ICD-10-CM | POA: Insufficient documentation

## 2023-07-28 DIAGNOSIS — E781 Pure hyperglyceridemia: Secondary | ICD-10-CM | POA: Diagnosis not present

## 2023-07-28 DIAGNOSIS — R1084 Generalized abdominal pain: Secondary | ICD-10-CM | POA: Diagnosis not present

## 2023-07-28 DIAGNOSIS — Z79899 Other long term (current) drug therapy: Secondary | ICD-10-CM | POA: Insufficient documentation

## 2023-07-28 DIAGNOSIS — E871 Hypo-osmolality and hyponatremia: Secondary | ICD-10-CM | POA: Insufficient documentation

## 2023-07-28 DIAGNOSIS — R519 Headache, unspecified: Secondary | ICD-10-CM | POA: Insufficient documentation

## 2023-07-28 DIAGNOSIS — I700 Atherosclerosis of aorta: Secondary | ICD-10-CM | POA: Insufficient documentation

## 2023-07-28 DIAGNOSIS — Z7982 Long term (current) use of aspirin: Secondary | ICD-10-CM | POA: Diagnosis not present

## 2023-07-28 DIAGNOSIS — K6389 Other specified diseases of intestine: Secondary | ICD-10-CM | POA: Diagnosis not present

## 2023-07-28 DIAGNOSIS — Z1152 Encounter for screening for COVID-19: Secondary | ICD-10-CM | POA: Insufficient documentation

## 2023-07-28 DIAGNOSIS — K5730 Diverticulosis of large intestine without perforation or abscess without bleeding: Secondary | ICD-10-CM | POA: Insufficient documentation

## 2023-07-28 DIAGNOSIS — Z20822 Contact with and (suspected) exposure to covid-19: Secondary | ICD-10-CM | POA: Insufficient documentation

## 2023-07-28 LAB — URINALYSIS, ROUTINE W REFLEX MICROSCOPIC
Bilirubin Urine: NEGATIVE
Glucose, UA: NEGATIVE mg/dL
Ketones, ur: NEGATIVE mg/dL
Leukocytes,Ua: NEGATIVE
Nitrite: NEGATIVE
Protein, ur: 100 mg/dL — AB
Specific Gravity, Urine: 1.025 (ref 1.005–1.030)
pH: 5 (ref 5.0–8.0)

## 2023-07-28 LAB — COMPREHENSIVE METABOLIC PANEL
ALT: 34 U/L (ref 0–44)
AST: 26 U/L (ref 15–41)
Albumin: 3.9 g/dL (ref 3.5–5.0)
Alkaline Phosphatase: 42 U/L (ref 38–126)
Anion gap: 7 (ref 5–15)
BUN: 22 mg/dL (ref 8–23)
CO2: 21 mmol/L — ABNORMAL LOW (ref 22–32)
Calcium: 8.5 mg/dL — ABNORMAL LOW (ref 8.9–10.3)
Chloride: 101 mmol/L (ref 98–111)
Creatinine, Ser: 1.53 mg/dL — ABNORMAL HIGH (ref 0.61–1.24)
GFR, Estimated: 50 mL/min — ABNORMAL LOW (ref >60)
Glucose, Bld: 131 mg/dL — ABNORMAL HIGH (ref 70–99)
Potassium: 3.4 mmol/L — ABNORMAL LOW (ref 3.5–5.1)
Sodium: 129 mmol/L — ABNORMAL LOW (ref 135–145)
Total Bilirubin: 1.4 mg/dL — ABNORMAL HIGH (ref 0.3–1.2)
Total Protein: 7.4 g/dL (ref 6.5–8.1)

## 2023-07-28 LAB — C DIFFICILE QUICK SCREEN W PCR REFLEX
C Diff antigen: NEGATIVE
C Diff interpretation: NOT DETECTED
C Diff toxin: NEGATIVE

## 2023-07-28 LAB — CBC WITH DIFFERENTIAL/PLATELET
Abs Immature Granulocytes: 0.03 10*3/uL (ref 0.00–0.07)
Basophils Absolute: 0 10*3/uL (ref 0.0–0.1)
Basophils Relative: 0 %
Eosinophils Absolute: 0 10*3/uL (ref 0.0–0.5)
Eosinophils Relative: 0 %
HCT: 45.2 % (ref 39.0–52.0)
Hemoglobin: 15.5 g/dL (ref 13.0–17.0)
Immature Granulocytes: 0 %
Lymphocytes Relative: 8 %
Lymphs Abs: 1.1 10*3/uL (ref 0.7–4.0)
MCH: 31.6 pg (ref 26.0–34.0)
MCHC: 34.3 g/dL (ref 30.0–36.0)
MCV: 92.1 fL (ref 80.0–100.0)
Monocytes Absolute: 1.2 10*3/uL — ABNORMAL HIGH (ref 0.1–1.0)
Monocytes Relative: 9 %
Neutro Abs: 11.2 10*3/uL — ABNORMAL HIGH (ref 1.7–7.7)
Neutrophils Relative %: 83 %
Platelets: 163 10*3/uL (ref 150–400)
RBC: 4.91 MIL/uL (ref 4.22–5.81)
RDW: 13.3 % (ref 11.5–15.5)
WBC: 13.6 10*3/uL — ABNORMAL HIGH (ref 4.0–10.5)
nRBC: 0 % (ref 0.0–0.2)

## 2023-07-28 LAB — SARS CORONAVIRUS 2 BY RT PCR: SARS Coronavirus 2 by RT PCR: NEGATIVE

## 2023-07-28 LAB — LIPASE, BLOOD: Lipase: 25 U/L (ref 11–51)

## 2023-07-28 LAB — LACTIC ACID, PLASMA: Lactic Acid, Venous: 1.2 mmol/L (ref 0.5–1.9)

## 2023-07-28 MED ORDER — ACETAMINOPHEN 325 MG PO TABS
650.0000 mg | ORAL_TABLET | Freq: Once | ORAL | Status: AC
  Administered 2023-07-28: 650 mg via ORAL
  Filled 2023-07-28: qty 2

## 2023-07-28 MED ORDER — DICYCLOMINE HCL 20 MG PO TABS
20.0000 mg | ORAL_TABLET | Freq: Two times a day (BID) | ORAL | 0 refills | Status: DC
Start: 2023-07-28 — End: 2023-12-24

## 2023-07-28 MED ORDER — ONDANSETRON HCL 4 MG PO TABS
4.0000 mg | ORAL_TABLET | Freq: Four times a day (QID) | ORAL | 0 refills | Status: DC
Start: 2023-07-28 — End: 2023-12-24

## 2023-07-28 MED ORDER — ONDANSETRON 4 MG PO TBDP
4.0000 mg | ORAL_TABLET | Freq: Once | ORAL | Status: AC
  Administered 2023-07-28: 4 mg via ORAL
  Filled 2023-07-28: qty 1

## 2023-07-28 MED ORDER — LACTATED RINGERS IV BOLUS
1000.0000 mL | Freq: Once | INTRAVENOUS | Status: AC
  Administered 2023-07-28: 1000 mL via INTRAVENOUS

## 2023-07-28 MED ORDER — IOHEXOL 300 MG/ML  SOLN
100.0000 mL | Freq: Once | INTRAMUSCULAR | Status: AC | PRN
  Administered 2023-07-28: 100 mL via INTRAVENOUS

## 2023-07-28 MED ORDER — DICYCLOMINE HCL 10 MG PO CAPS
10.0000 mg | ORAL_CAPSULE | Freq: Once | ORAL | Status: AC
  Administered 2023-07-28: 10 mg via ORAL
  Filled 2023-07-28: qty 1

## 2023-07-28 NOTE — Discharge Instructions (Signed)
Tylenol every 4 hours for fever.  And diet as tolerated.  Continue fluids.  Take the medication as directed.  Please follow-up with your primary care provider for recheck.  Your stool sample is pending.  You will be notified of any positive results.  Return to the emergency department for any new or worsening symptoms.

## 2023-07-28 NOTE — ED Triage Notes (Signed)
Pt presents with fever, diarrhea and chills, headache since Saturday.

## 2023-07-28 NOTE — ED Provider Notes (Signed)
[name] Provider Note   CSN: [id number] Arrival date & time: 07/28/23  1454     History {Add pertinent medical, surgical, social history, OB history to HPI:1} No chief complaint on file.   [name] is a 67 y.o. male.  HPI     [name] is a 67 y.o. male past medical history of hypertension, elevated triglycerides, who presents to the Emergency Department complaining of shaking chills, headache, diarrhea and abdominal bloating discomfort.  He began having diarrhea 2 days ago.  Diarrhea began after eating a hot dog.  Denies any known sick contacts.  Describes watery brown stools without blood or melena.  Some abdominal bloating and diffuse discomfort.  Nausea without vomiting.  Shaking chills earlier today.  Did not check his temperature at home.  Also describes diffuse frontal headache earlier today.  Headache gradual in onset.  Denies any vomiting, flank pain, dysuria.  No known COVID exposures.     Home Medications Prior to Admission medications   Medication Sig Start Date End Date Taking? Authorizing Provider  aspirin EC 81 MG tablet Take 1 tablet (81 mg total) by mouth daily. Swallow whole. 10/21/22   Hilty, Lisette Abu, MD  fenofibrate 160 MG tablet Take 160 mg by mouth daily.     [provider]  ketoconazole (NIZORAL) 2 % cream daily.    [provider]  olmesartan (BENICAR) 20 MG tablet Take 1 tablet (20 mg total) by mouth daily. 04/24/23   Hilty, Lisette Abu, MD  Omega-3 Fatty Acids (FISH OIL) 1200 MG CAPS Take 1,200 mg by mouth daily.    [provider]  rosuvastatin (CRESTOR) 5 MG tablet TAKE 1 TABLET(5 MG) BY MOUTH DAILY 02/11/23   Hilty, Lisette Abu, MD  tamsulosin (FLOMAX) 0.4 MG CAPS capsule TAKE 1 CAPSULE(0.4 MG) BY MOUTH DAILY 01/14/23   McKenzie, Mardene Celeste, MD  traZODone (DESYREL) 50 MG tablet Take 50 mg by mouth at bedtime.    [provider]      Allergies    Patient has no  known allergies.    Review of Systems   Review of Systems  Constitutional:  Positive for chills and fever. Negative for appetite change.  HENT:  Negative for trouble swallowing.   Respiratory:  Negative for cough and shortness of breath.   Cardiovascular:  Negative for chest pain.  Gastrointestinal:  Positive for abdominal pain, diarrhea and nausea. Negative for vomiting.  Genitourinary:  Negative for difficulty urinating, dysuria and flank pain.  Musculoskeletal:  Negative for arthralgias and back pain.  Skin:  Negative for rash.  Neurological:  Negative for dizziness, weakness, numbness and headaches.    Physical Exam Updated Vital Signs BP 96/80   Pulse 81   Temp 98.3 F (36.8 C)   Resp 20   Ht 6\' 2"  (1.88 m)   Wt 115.7 kg   SpO2 95%   BMI 32.74 kg/m  Physical Exam Vitals and nursing note reviewed.  Constitutional:      General: He is not in acute distress.    Appearance: Normal appearance. He is not ill-appearing or toxic-appearing.  HENT:     Mouth/Throat:     Mouth: Mucous membranes are moist.     Pharynx: Oropharynx is clear.  Cardiovascular:     Rate and Rhythm: Normal rate and regular rhythm.     Pulses: Normal pulses.  Pulmonary:     Effort: Pulmonary effort is normal.  Abdominal:  General: There is no distension.     Palpations: Abdomen is soft.     Tenderness: There is no abdominal tenderness. There is no right CVA tenderness, left CVA tenderness or guarding.  Musculoskeletal:        General: Normal range of motion.  Skin:    General: Skin is warm.     Capillary Refill: Capillary refill takes less than 2 seconds.  Neurological:     General: No focal deficit present.     Mental Status: He is alert.     Sensory: No sensory deficit.     Motor: No weakness.     ED Results / Procedures / Treatments   Labs (all labs ordered are listed, but only abnormal results are displayed) Labs Reviewed  CBC WITH DIFFERENTIAL/PLATELET - Abnormal; Notable for  the following components:      Result Value   WBC 13.6 (*)    Neutro Abs 11.2 (*)    Monocytes Absolute 1.2 (*)    All other components within normal limits  COMPREHENSIVE METABOLIC PANEL - Abnormal; Notable for the following components:   Sodium 129 (*)    Potassium 3.4 (*)    CO2 21 (*)    Glucose, Bld 131 (*)    Creatinine, Ser 1.53 (*)    Calcium 8.5 (*)    Total Bilirubin 1.4 (*)    GFR, Estimated 50 (*)    All other components within normal limits  URINALYSIS, ROUTINE W REFLEX MICROSCOPIC - Abnormal; Notable for the following components:   Color, Urine AMBER (*)    APPearance HAZY (*)    Hgb urine dipstick LARGE (*)    Protein, ur 100 (*)    Bacteria, UA RARE (*)    All other components within normal limits  SARS CORONAVIRUS 2 BY RT PCR  CULTURE, BLOOD (ROUTINE X 2)  CULTURE, BLOOD (ROUTINE X 2)  C DIFFICILE QUICK SCREEN W PCR REFLEX    GASTROINTESTINAL PANEL BY PCR, STOOL (REPLACES STOOL CULTURE)  LIPASE, BLOOD  LACTIC ACID, PLASMA    EKG None  Radiology CT ABDOMEN PELVIS W CONTRAST  Result Date: 07/28/2023 CLINICAL DATA:  Fever, diarrhea and chills. EXAM: CT ABDOMEN AND PELVIS WITH CONTRAST TECHNIQUE: Multidetector CT imaging of the abdomen and pelvis was performed using the standard protocol following bolus administration of intravenous contrast. RADIATION DOSE REDUCTION: This exam was performed according to the departmental dose-optimization program which includes automated exposure control, adjustment of the mA and/or kV according to patient size and/or use of iterative reconstruction technique. CONTRAST:  OMNIPAQUE IOHEXOL 300 MG/ML  SOLN COMPARISON:  November 24, 2022 FINDINGS: Lower chest: No acute abnormality. Hepatobiliary: No focal liver abnormality is seen. Status post cholecystectomy. No biliary dilatation. Pancreas: Unremarkable. No pancreatic ductal dilatation or surrounding inflammatory changes. Spleen: Normal in size without focal abnormality.  Adrenals/Urinary Tract: Adrenal glands are unremarkable. Kidneys are normal in size, without obstructing renal calculi or hydronephrosis. A 2 mm nonobstructing renal calculus is seen within the mid right kidney. 2.9 cm and 3.1 cm diameter simple cysts are seen within the left kidney. The urinary bladder is poorly distended and subsequently limited in evaluation. Mild, stable anterior urinary bladder wall thickening is noted. Stomach/Bowel: Stomach is within normal limits. The appendix is surgically absent. No evidence of bowel dilatation. Mildly thickened and poorly distended distal ascending colon is seen. Noninflamed diverticula are seen within the mid sigmoid colon. Vascular/Lymphatic: Aortic atherosclerosis. No enlarged abdominal or pelvic lymph nodes. Reproductive: The prostate gland is  mildly enlarged. Other: No abdominal wall hernia or abnormality. No abdominopelvic ascites. Musculoskeletal: Degenerative changes are seen at the level of L5-S1. IMPRESSION: 1. Mildly thickened and poorly distended distal ascending colon which may represent mild colitis. 2. Sigmoid diverticulosis. 3. Evidence of prior cholecystectomy and prior appendectomy. 4. 2 mm nonobstructing right renal calculus. 5. Aortic atherosclerosis. Aortic Atherosclerosis (ICD10-I70.0). Electronically Signed   By: Aram Candela M.D.   On: 07/28/2023 21:49   DG Chest 2 View  Result Date: 07/28/2023 CLINICAL DATA:  Cough x1 day EXAM: CHEST - 2 VIEW COMPARISON:  May 10, 2021 FINDINGS: The heart size and mediastinal contours are within normal limits. Both lungs are clear. Multilevel degenerative changes are seen throughout the thoracic spine. IMPRESSION: No active cardiopulmonary disease. Electronically Signed   By: Aram Candela M.D.   On: 07/28/2023 19:13    Procedures Procedures  {Document cardiac monitor, telemetry assessment procedure when appropriate:1}  Medications Ordered in ED Medications  acetaminophen (TYLENOL) tablet 650  mg (650 mg Oral Given 07/28/23 1625)    ED Course/ Medical Decision Making/ A&P   {   Click here for ABCD2, HEART and other calculatorsREFRESH Note before signing :1}                              Medical Decision Making Patient here for evaluation of abdominal bloating, 2 days of watery brown stools, nausea fever and chills.  Symptoms began after eating a hot dog.  No known sick contacts, no recent antibiotics.  Febrile on arrival.   Patient well-appearing on my exam.  Fever improved after Tylenol.  Differential would include but not limited to C. difficile, gastroenteritis, viral process, COVID.    Amount and/or Complexity of Data Reviewed Labs: ordered. Radiology: ordered. Discussion of management or test interpretation with external provider(s): Pt also seen by Dr. Andria Meuse and care plan discussed.    Likely viral process.  Will treat with bentyl and zofran.  He will f/u closely with PCP  Risk OTC drugs. Prescription drug management.     {Document critical care time when appropriate:1} {Document review of labs and clinical decision tools ie heart score, Chads2Vasc2 etc:1}  {Document your independent review of radiology images, and any outside records:1} {Document your discussion with family members, caretakers, and with consultants:1} {Document social determinants of health affecting pt's care:1} {Document your decision making why or why not admission, treatments were needed:1} Final Clinical Impression(s) / ED Diagnoses Final diagnoses:  None    Rx / DC Orders ED Discharge Orders     None

## 2023-07-30 LAB — GASTROINTESTINAL PANEL BY PCR, STOOL (REPLACES STOOL CULTURE)

## 2023-08-02 LAB — CULTURE, BLOOD (ROUTINE X 2)
Culture: NO GROWTH
Culture: NO GROWTH
Special Requests: ADEQUATE

## 2023-08-04 DIAGNOSIS — M25562 Pain in left knee: Secondary | ICD-10-CM | POA: Diagnosis not present

## 2023-08-04 DIAGNOSIS — M1712 Unilateral primary osteoarthritis, left knee: Secondary | ICD-10-CM | POA: Diagnosis not present

## 2023-08-07 DIAGNOSIS — M25562 Pain in left knee: Secondary | ICD-10-CM | POA: Diagnosis not present

## 2023-08-07 DIAGNOSIS — M1712 Unilateral primary osteoarthritis, left knee: Secondary | ICD-10-CM | POA: Diagnosis not present

## 2023-08-18 DIAGNOSIS — M79674 Pain in right toe(s): Secondary | ICD-10-CM | POA: Diagnosis not present

## 2023-08-18 DIAGNOSIS — M79675 Pain in left toe(s): Secondary | ICD-10-CM | POA: Diagnosis not present

## 2023-08-18 DIAGNOSIS — B351 Tinea unguium: Secondary | ICD-10-CM | POA: Diagnosis not present

## 2023-09-15 DIAGNOSIS — M170 Bilateral primary osteoarthritis of knee: Secondary | ICD-10-CM | POA: Diagnosis not present

## 2023-10-09 DIAGNOSIS — K080 Exfoliation of teeth due to systemic causes: Secondary | ICD-10-CM | POA: Diagnosis not present

## 2023-10-22 ENCOUNTER — Ambulatory Visit (HOSPITAL_COMMUNITY)
Admission: RE | Admit: 2023-10-22 | Discharge: 2023-10-22 | Disposition: A | Discharge status: Home / Self Care | Payer: Medicare Other | Source: Ambulatory Visit | Attending: Internal Medicine | Admitting: Internal Medicine

## 2023-10-22 DIAGNOSIS — I351 Nonrheumatic aortic (valve) insufficiency: Secondary | ICD-10-CM | POA: Insufficient documentation

## 2023-10-22 DIAGNOSIS — I77810 Thoracic aortic ectasia: Secondary | ICD-10-CM | POA: Insufficient documentation

## 2023-10-22 LAB — ECHOCARDIOGRAM COMPLETE
AR max vel: 3.03 cm2
AV Area VTI: 3.03 cm2
AV Area mean vel: 2.8 cm2
AV Mean grad: 3 mm[Hg]
AV Peak grad: 5.9 mm[Hg]
Ao pk vel: 1.21 m/s
Area-P 1/2: 2.74 cm2
P 1/2 time: 703 ms
S' Lateral: 2.6 cm

## 2023-10-27 DIAGNOSIS — M79674 Pain in right toe(s): Secondary | ICD-10-CM | POA: Diagnosis not present

## 2023-10-27 DIAGNOSIS — B351 Tinea unguium: Secondary | ICD-10-CM | POA: Diagnosis not present

## 2023-10-27 DIAGNOSIS — M79675 Pain in left toe(s): Secondary | ICD-10-CM | POA: Diagnosis not present

## 2023-11-13 ENCOUNTER — Other Ambulatory Visit: Payer: Self-pay | Admitting: Internal Medicine

## 2023-12-04 DIAGNOSIS — D518 Other vitamin B12 deficiency anemias: Secondary | ICD-10-CM | POA: Diagnosis not present

## 2023-12-04 DIAGNOSIS — M47812 Spondylosis without myelopathy or radiculopathy, cervical region: Secondary | ICD-10-CM | POA: Diagnosis not present

## 2023-12-04 DIAGNOSIS — G4733 Obstructive sleep apnea (adult) (pediatric): Secondary | ICD-10-CM | POA: Diagnosis not present

## 2023-12-04 DIAGNOSIS — I872 Venous insufficiency (chronic) (peripheral): Secondary | ICD-10-CM | POA: Diagnosis not present

## 2023-12-04 DIAGNOSIS — Z1331 Encounter for screening for depression: Secondary | ICD-10-CM | POA: Diagnosis not present

## 2023-12-04 DIAGNOSIS — E039 Hypothyroidism, unspecified: Secondary | ICD-10-CM | POA: Diagnosis not present

## 2023-12-04 DIAGNOSIS — G9332 Myalgic encephalomyelitis/chronic fatigue syndrome: Secondary | ICD-10-CM | POA: Diagnosis not present

## 2023-12-04 DIAGNOSIS — Z125 Encounter for screening for malignant neoplasm of prostate: Secondary | ICD-10-CM | POA: Diagnosis not present

## 2023-12-04 DIAGNOSIS — M5412 Radiculopathy, cervical region: Secondary | ICD-10-CM | POA: Diagnosis not present

## 2023-12-04 DIAGNOSIS — Z6833 Body mass index (BMI) 33.0-33.9, adult: Secondary | ICD-10-CM | POA: Diagnosis not present

## 2023-12-04 DIAGNOSIS — M179 Osteoarthritis of knee, unspecified: Secondary | ICD-10-CM | POA: Diagnosis not present

## 2023-12-04 DIAGNOSIS — E6609 Other obesity due to excess calories: Secondary | ICD-10-CM | POA: Diagnosis not present

## 2023-12-04 DIAGNOSIS — I10 Essential (primary) hypertension: Secondary | ICD-10-CM | POA: Diagnosis not present

## 2023-12-04 DIAGNOSIS — Z0001 Encounter for general adult medical examination with abnormal findings: Secondary | ICD-10-CM | POA: Diagnosis not present

## 2023-12-04 DIAGNOSIS — E559 Vitamin D deficiency, unspecified: Secondary | ICD-10-CM | POA: Diagnosis not present

## 2023-12-12 ENCOUNTER — Other Ambulatory Visit (HOSPITAL_COMMUNITY): Payer: Self-pay | Admitting: Internal Medicine

## 2023-12-12 DIAGNOSIS — R748 Abnormal levels of other serum enzymes: Secondary | ICD-10-CM

## 2023-12-22 ENCOUNTER — Ambulatory Visit (HOSPITAL_COMMUNITY)
Admission: RE | Admit: 2023-12-22 | Discharge: 2023-12-22 | Disposition: A | Discharge status: Home / Self Care | Payer: Medicare Other | Source: Ambulatory Visit | Attending: Internal Medicine | Admitting: Internal Medicine

## 2023-12-22 DIAGNOSIS — R748 Abnormal levels of other serum enzymes: Secondary | ICD-10-CM

## 2023-12-22 DIAGNOSIS — R161 Splenomegaly, not elsewhere classified: Secondary | ICD-10-CM | POA: Diagnosis not present

## 2023-12-22 DIAGNOSIS — Z9049 Acquired absence of other specified parts of digestive tract: Secondary | ICD-10-CM | POA: Diagnosis not present

## 2023-12-22 DIAGNOSIS — N281 Cyst of kidney, acquired: Secondary | ICD-10-CM | POA: Diagnosis not present

## 2023-12-24 ENCOUNTER — Inpatient Hospital Stay: Payer: Medicare Other

## 2023-12-24 ENCOUNTER — Inpatient Hospital Stay: Payer: Medicare Other | Attending: Oncology | Admitting: Oncology

## 2023-12-24 VITALS — BP 137/96 | HR 66 | Temp 96.6°F | Resp 16 | Ht 73.23 in | Wt 256.6 lb

## 2023-12-24 DIAGNOSIS — K760 Fatty (change of) liver, not elsewhere classified: Secondary | ICD-10-CM | POA: Insufficient documentation

## 2023-12-24 DIAGNOSIS — Z7982 Long term (current) use of aspirin: Secondary | ICD-10-CM | POA: Diagnosis not present

## 2023-12-24 DIAGNOSIS — M170 Bilateral primary osteoarthritis of knee: Secondary | ICD-10-CM | POA: Insufficient documentation

## 2023-12-24 DIAGNOSIS — R161 Splenomegaly, not elsewhere classified: Secondary | ICD-10-CM | POA: Insufficient documentation

## 2023-12-24 DIAGNOSIS — R7989 Other specified abnormal findings of blood chemistry: Secondary | ICD-10-CM

## 2023-12-24 DIAGNOSIS — Z79899 Other long term (current) drug therapy: Secondary | ICD-10-CM | POA: Diagnosis not present

## 2023-12-24 DIAGNOSIS — R748 Abnormal levels of other serum enzymes: Secondary | ICD-10-CM | POA: Diagnosis not present

## 2023-12-24 DIAGNOSIS — K7689 Other specified diseases of liver: Secondary | ICD-10-CM | POA: Insufficient documentation

## 2023-12-24 LAB — HEPATITIS PANEL, ACUTE
HCV Ab: NONREACTIVE
Hep A IgM: NONREACTIVE
Hep B C IgM: NONREACTIVE
Hepatitis B Surface Ag: NONREACTIVE

## 2023-12-24 NOTE — Progress Notes (Signed)
 [facility] at [facility] HEMATOLOGY NEW VISIT  Bertell Satterfield, MD  REASON FOR REFERRAL: Splenomegaly  HISTORY OF PRESENT ILLNESS: [name] 68 y.o. male referred for splenomegaly.  Patient was recently at his primary care physician for a wellness visit and had labs done.  He was called to tell him that his liver enzymes were elevated and ultrasound was done which showed splenomegaly.He reports no recent changes in health status or symptoms. He denies recent changes in appetite, bloating, weight loss, night sweats, fever, chills, or abnormal lumps.  The patient has a history of gallbladder and appendix removal. He also reports having osteoarthritis in both knees, which is bone-on-bone and limits his physical activity. He plans to have knee replacement surgery once his spouse has recovered from their own upcoming knee replacement surgery. 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  He is overall feeling well and has no complaints today.   He denies smoking and drinking alcohol.  He is retired and lives in [place] with his wife.  I have reviewed the past medical history, past surgical history, social history and family history with the patient   ALLERGIES:  has no known allergies.  MEDICATIONS:  Current Outpatient Medications  Medication Sig Dispense Refill   aspirin  EC 81 MG tablet Take 1 tablet (81 mg total) by mouth daily. Swallow whole. 90 tablet 3   fenofibrate  160 MG tablet Take 160 mg by mouth daily.      ketoconazole (NIZORAL) 2 % cream daily.     olmesartan  (BENICAR ) 20 MG tablet Take 1 tablet (20 mg total) by mouth daily. 90 tablet 3   Omega-3 Fatty Acids (FISH OIL) 1200 MG CAPS Take 1,200 mg by mouth daily.     rosuvastatin  (CRESTOR ) 5 MG tablet TAKE 1 TABLET(5 MG) BY MOUTH DAILY 90 tablet 1   tamsulosin  (FLOMAX ) 0.4 MG CAPS capsule TAKE 1 CAPSULE(0.4 MG) BY MOUTH DAILY 90  capsule 3   traZODone  (DESYREL ) 50 MG tablet Take 50 mg by mouth at bedtime.     No current facility-administered medications for this visit.     REVIEW OF SYSTEMS:   Constitutional: Denies fevers, chills or night sweats Eyes: Denies blurriness of vision Ears, nose, mouth, throat, and face: Denies mucositis or sore throat Respiratory: Denies cough, dyspnea or wheezes Cardiovascular: Denies palpitation, chest discomfort or lower extremity swelling Gastrointestinal:  Denies nausea, heartburn or change in bowel habits Skin: Denies abnormal skin rashes Lymphatics: Denies new lymphadenopathy or easy bruising Neurological:Denies numbness, tingling or new weaknesses Behavioral/Psych: Mood is stable, no new changes  All other systems were reviewed with the patient and are negative.  PHYSICAL EXAMINATION:   Vitals:   12/24/23 0848 12/24/23 0853  BP: (!) 140/94 (!) 137/96  Pulse: 66   Resp: 16   Temp: (!) 96.6 F (35.9 C)   SpO2: 97%     GENERAL:alert, no distress and comfortable LYMPH:  no palpable lymphadenopathy in the cervical, axillary or inguinal LUNGS: clear to auscultation and percussion with normal breathing effort HEART: regular rate & rhythm and no murmurs and no lower extremity edema ABDOMEN:abdomen soft, non-tender and normal bowel sounds Musculoskeletal:no cyanosis of digits and no clubbing  NEURO: alert & oriented x 3 with fluent speech  LABORATORY DATA:  I have reviewed the data as listed labs from primary care done on 12/11/2023 CBC: WBC: 6.3, hemoglobin: 16.8, hematocrit: 49, MCV: 91, platelets: 203 CMP: Sodium: 141, potassium:  3.9, AST: 50, ALT: 70 Vitamin B12: 324   Lab Results  Component Value Date   WBC 13.6 (H) 07/28/2023   NEUTROABS 11.2 (H) 07/28/2023   HGB 15.5 07/28/2023   HCT 45.2 07/28/2023   MCV 92.1 07/28/2023   PLT 163 07/28/2023      Component Value Date/Time   NA 129 (L) 07/28/2023 1822   NA 143 10/03/2022 1209   K 3.4 (L) 07/28/2023  1822   CL 101 07/28/2023 1822   CO2 21 (L) 07/28/2023 1822   GLUCOSE 131 (H) 07/28/2023 1822   BUN 22 07/28/2023 1822   BUN 18 10/03/2022 1209   CREATININE 1.53 (H) 07/28/2023 1822   CALCIUM  8.5 (L) 07/28/2023 1822   PROT 7.4 07/28/2023 1822   PROT 6.9 03/05/2022 0818   ALBUMIN 3.9 07/28/2023 1822   ALBUMIN 4.2 03/05/2022 0818   AST 26 07/28/2023 1822   ALT 34 07/28/2023 1822   ALKPHOS 42 07/28/2023 1822   BILITOT 1.4 (H) 07/28/2023 1822   BILITOT 0.4 03/05/2022 0818   GFRNONAA 50 (L) 07/28/2023 1822   GFRAA >60 07/12/2018 0737      Chemistry      Component Value Date/Time   NA 129 (L) 07/28/2023 1822   NA 143 10/03/2022 1209   K 3.4 (L) 07/28/2023 1822   CL 101 07/28/2023 1822   CO2 21 (L) 07/28/2023 1822   BUN 22 07/28/2023 1822   BUN 18 10/03/2022 1209   CREATININE 1.53 (H) 07/28/2023 1822      Component Value Date/Time   CALCIUM  8.5 (L) 07/28/2023 1822   ALKPHOS 42 07/28/2023 1822   AST 26 07/28/2023 1822   ALT 34 07/28/2023 1822   BILITOT 1.4 (H) 07/28/2023 1822   BILITOT 0.4 03/05/2022 0818       RADIOGRAPHIC STUDIES: I have personally reviewed the radiological images as listed and agreed with the findings in the report. No results found.   ASSESSMENT & PLAN:  Patient is a 68 year old male referred for splenomegaly   Splenomegaly Mildly enlarged spleen (14.7 cm) likely secondary to liver dysfunction.  Expected spleen size for his height is 13.8 cm.  No concerning symptoms for lymphoma or other malignancies.  No B symptoms.  CBC normal.  Asymptomatic. -No further action required at this time. -Advised patient to contact clinic if he experiences decreased appetite, early satiety, abdominal pain, weight loss, night sweats, or other concerning symptoms.  Liver dysfunction Elevated liver enzymes and steatosis in the liver noted on ultrasound. Likely contributing to splenomegaly. -Referral to a gastroenterologist for further evaluation and  management. -Order Hepatitis panel to rule out viral hepatitis as a cause for liver dysfunction. -Encouraged weight loss to improve liver health. -Advised patient to maintain regular physical activity as tolerated.   Orders Placed This Encounter  Procedures   Hepatitis panel, acute    Standing Status:   Future    Number of Occurrences:   1    Expected Date:   12/24/2023    Expiration Date:   12/23/2024    The total time spent in the appointment was 45 minutes encounter with patients including review of chart and various tests results, discussions about plan of care and coordination of care plan   All questions were answered. The patient knows to call the clinic with any problems, questions or concerns. No barriers to learning was detected.   Mickiel Dry, MD 2/5/202511:17 AM

## 2023-12-24 NOTE — Patient Instructions (Signed)
 VISIT SUMMARY:  Today, we reviewed your recent lab results and discussed your annual wellness physical. We focused on the elevated liver enzymes and the findings from your abdominal ultrasound, which showed an enlarged spleen. We also talked about your osteoarthritis and your plans for knee replacement surgery.  YOUR PLAN:  -SPLENOMEGALY: Splenomegaly means an enlarged spleen. Your spleen is mildly enlarged, likely due to liver dysfunction. At this time, no further action is required, but please contact the clinic if you experience decreased appetite, early fullness, abdominal pain, weight loss, night sweats, or other concerning symptoms.  -LIVER DYSFUNCTION: Liver dysfunction means your liver is not working as well as it should, which is shown by elevated liver enzymes and fat in the liver. This may be contributing to your enlarged spleen. We will refer you to a gastroenterologist for further evaluation and management. Additionally, we will order a Hepatitis panel to rule out viral hepatitis as a cause.  -GENERAL HEALTH MAINTENANCE: To improve your liver health, we encourage you to lose weight and maintain regular physical activity as tolerated.  INSTRUCTIONS:  Please follow up with the gastroenterologist as soon as you can for further evaluation of your liver function. Also, get the Hepatitis panel done at your earliest convenience. Contact our clinic if you notice any new or worsening symptoms.

## 2023-12-24 NOTE — Assessment & Plan Note (Addendum)
 Mildly enlarged spleen (14.7 cm) likely secondary to liver dysfunction.  Expected spleen size for his height is 13.8 cm.  No concerning symptoms for lymphoma or other malignancies.  No B symptoms.  CBC normal.  Asymptomatic. -No further action required at this time. -Advised patient to contact clinic if he experiences decreased appetite, early satiety, abdominal pain, weight loss, night sweats, or other concerning symptoms.

## 2023-12-24 NOTE — Assessment & Plan Note (Addendum)
 Elevated liver enzymes and steatosis in the liver noted on ultrasound. Likely contributing to splenomegaly. -Referral to a gastroenterologist for further evaluation and management. -Order Hepatitis panel to rule out viral hepatitis as a cause for liver dysfunction. -Encouraged weight loss to improve liver health. -Advised patient to maintain regular physical activity as tolerated.

## 2023-12-31 ENCOUNTER — Encounter (INDEPENDENT_AMBULATORY_CARE_PROVIDER_SITE_OTHER): Payer: Self-pay | Admitting: *Deleted

## 2024-01-06 ENCOUNTER — Ambulatory Visit: Payer: Medicare Other | Admitting: Gastroenterology

## 2024-01-06 ENCOUNTER — Encounter: Payer: Self-pay | Admitting: Gastroenterology

## 2024-01-06 VITALS — BP 136/85 | HR 76 | Temp 98.4°F | Ht 74.0 in | Wt 257.8 lb

## 2024-01-06 DIAGNOSIS — R748 Abnormal levels of other serum enzymes: Secondary | ICD-10-CM

## 2024-01-06 DIAGNOSIS — K760 Fatty (change of) liver, not elsewhere classified: Secondary | ICD-10-CM | POA: Diagnosis not present

## 2024-01-06 DIAGNOSIS — R161 Splenomegaly, not elsewhere classified: Secondary | ICD-10-CM | POA: Diagnosis not present

## 2024-01-06 DIAGNOSIS — Z860101 Personal history of adenomatous and serrated colon polyps: Secondary | ICD-10-CM | POA: Insufficient documentation

## 2024-01-06 DIAGNOSIS — R7989 Other specified abnormal findings of blood chemistry: Secondary | ICD-10-CM | POA: Diagnosis not present

## 2024-01-06 NOTE — Progress Notes (Signed)
 GI Office Note    Referring Provider: Cindie Crumbly, MD Primary Care Physician:  Elfredia Nevins, MD  Primary Gastroenterologist: Roetta Sessions, MD   Chief Complaint   Chief Complaint  Patient presents with   Elevated LFT's     History of Present Illness   [name] is a 68 y.o. male presenting today at the request of Dr. Anders Simmonds elevated LFTs.    Today: Patient states he just recently found out he had elevated LFTs at his physical. He denies any abdominal pain.  No reflux symptoms.  No bowel concerns.  No blood in the stool or melena.  He has a history of 7 adenomatous colon polyps removed in July 2019, due surveillance in 2022, patient states he he also did not recall that he was due for colonoscopy in 2022.  Chronically on medication for elevated triglycerides and cholesterol.  No family history of cirrhosis.  States he has been told he is prediabetic.  Glucose in the 160s, not clear whether he was fasting.  Patient states he has been able to lose weight in the past but not successful at maintaining the weight loss.  Limited exercise due to need for bilateral knee replacements.  He is open for doing other types of physical activities such as resistance training, chair exercises etc.  Rarely has an alcoholic beverage.  No history of heavy alcohol use in the past.  Labs in February 2025: Hepatitis B surface antigen nonreactive, hepatitis C antibody nonreactive, hepatitis A IgM nonreactive, hepatitis B core IgM nonreactive.  Labs January 2025: Creatinine 1.41, glucose 161, total bilirubin 0.7, alkaline phosphatase 67, AST 50, ALT 70, total cholesterol 146, triglycerides 141, HDL 43, LDL 78, B12 324, folate greater than 20, TSH 2.26.  Additional LFTs as below.  Component     Latest Ref Rng 09/19/2021 03/05/2022 05/17/2022 05/18/2022 05/19/2022 10/03/2022  Albumin     3.5 - 5.0 g/dL 4.2  4.2  4.1      AST     15 - 41 U/L 51 (H)  46 (H)  35      ALT     0 - 44 U/L 84 (H)   66 (H)  59 (H)      Alkaline Phosphatase     38 - 126 U/L 47  56  49      Total Bilirubin     0.3 - 1.2 mg/dL 0.7  0.4  0.9       Component     Latest Ref Rng 11/24/2022 07/28/2023  Albumin     3.5 - 5.0 g/dL 4.1  3.9   AST     15 - 41 U/L 29  26   ALT     0 - 44 U/L 37  34   Alkaline Phosphatase     38 - 126 U/L 41  42   Total Bilirubin     0.3 - 1.2 mg/dL 0.9  1.4 (H)      Abd u/s 12/2023: IMPRESSION: 1. Increased hepatic echotexture, most commonly seen with steatosis. Correlation with LFT's is recommended. 2.  Surgically absent gallbladder.  No biliary dilatation. 3.  Multiple renal cysts. 4.  Splenomegaly.  CT A/P with contrast 07/2023: IMPRESSION: 1. Mildly thickened and poorly distended distal ascending colon which may represent mild colitis. 2. Sigmoid diverticulosis. 3. Evidence of prior cholecystectomy and prior appendectomy. 4. 2 mm nonobstructing right renal calculus. 5. Aortic atherosclerosis.  Colonoscopy 05/2018: -diverticulosis -three 7-107mm polyps in descending  and ascending colon removed -four 5-22mm polyps in descending and ascending colon removed -multiple tubular adenomas -colonoscopy 3 years    Medications   Current Outpatient Medications  Medication Sig Dispense Refill   aspirin EC 81 MG tablet Take 1 tablet (81 mg total) by mouth daily. Swallow whole. 90 tablet 3   fenofibrate 160 MG tablet Take 160 mg by mouth daily.      folic acid (FOLVITE) 1 MG tablet Take 1 mg by mouth daily.     furosemide (LASIX) 20 MG tablet Take 20 mg by mouth daily.     ketoconazole (NIZORAL) 2 % cream daily.     olmesartan (BENICAR) 20 MG tablet Take 1 tablet (20 mg total) by mouth daily. 90 tablet 3   Omega-3 Fatty Acids (FISH OIL) 1200 MG CAPS Take 1,200 mg by mouth daily.     rosuvastatin (CRESTOR) 5 MG tablet TAKE 1 TABLET(5 MG) BY MOUTH DAILY 90 tablet 1   tamsulosin (FLOMAX) 0.4 MG CAPS capsule TAKE 1 CAPSULE(0.4 MG) BY MOUTH DAILY 90 capsule 3   traZODone  (DESYREL) 50 MG tablet Take 50 mg by mouth at bedtime.     Vitamin D, Ergocalciferol, (DRISDOL) 1.25 MG (50000 UNIT) CAPS capsule Take 50,000 Units by mouth once a week.     No current facility-administered medications for this visit.    Allergies   Allergies as of 01/06/2024   (No Known Allergies)    Past Medical History   Past Medical History:  Diagnosis Date   History of kidney stones    Hypertension    Hypertriglyceridemia    OSA on CPAP    POST  SLEEP APNEA SURGERY --- PER SLEEP STUDY 06-26-15-2006  MODERATE OSA   USES CPAP DAILY   Renal disorder    Scrotal swelling    Shortness of breath dyspnea    with exertion   Urgency of urination    OCCASIONAL    Past Surgical History   Past Surgical History:  Procedure Laterality Date   APPENDECTOMY     CHOLECYSTECTOMY N/A 08/07/2018   Procedure: LAPAROSCOPIC CHOLECYSTECTOMY;  Surgeon: Franky Macho, MD;  Location: [facility];  Service: General;  Laterality: N/A;   COLONOSCOPY     COLONOSCOPY N/A 06/05/2018   Procedure: COLONOSCOPY;  Surgeon: Corbin Ade, MD;  Location: [facility];  Service: Endoscopy;  Laterality: N/A;  7:30   DISTAL BICEPS TENDON REPAIR Right 01/30/2015   Procedure: DISTAL BICEPS TENDON REPAIR;  Surgeon: Eldred Manges, MD;  Location: [facility];  Service: Orthopedics;  Laterality: Right;   EXPLORATION LAPAROTOMY/  PARTIAL COLECTOMY WITH RESECTION TERMINAL ILEUM (APPENDICEAL MASS)  01-16-2010   BENIGN NEOPLASM APPENDIX   EXTRACORPOREAL SHOCK WAVE LITHOTRIPSY Right 12/17/2022   Procedure: EXTRACORPOREAL SHOCK WAVE LITHOTRIPSY (ESWL);  Surgeon: Malen Gauze, MD;  Location: [facility];  Service: Urology;  Laterality: Right;   HYDROCELE EXCISION Right 03/18/2014   Procedure:  RIGHT SPERMACELECTOMY;  Surgeon: Garnett Farm, MD;  [facility];  Service: Urology;  Laterality: Right;   KNEE ARTHROSCOPY Right 2010   LITHOTRIPSY     LIVER BIOPSY  08/07/2018   Procedure: LIVER BIOPSY;  Surgeon:  Franky Macho, MD;  Location: [facility];  Service: General;;   POLYPECTOMY  06/05/2018   Procedure: POLYPECTOMY;  Surgeon: Corbin Ade, MD;  Location: [facility];  Service: Endoscopy;;  ascending and descending   TONSILLECTOMY     UVULOPALATOPHARYNGOPLASTY  2000 (APPROX)   W/  DEVIATED SEPTUM  REPAIR  AND TONSILLECTOMY    Past Family History   Family History  Problem Relation Age of Onset   Heart attack Mother    Heart attack Father    Heart attack Maternal Uncle    Colon cancer Neg Hx    Cirrhosis Neg Hx     Past Social History   Social History   Socioeconomic History   Marital status: Married    Spouse name: Not on file   Number of children: Not on file   Years of education: Not on file   Highest education level: Not on file  Occupational History   Not on file  Tobacco Use   Smoking status: Never   Smokeless tobacco: Never  Vaping Use   Vaping status: Never Used  Substance and Sexual Activity   Alcohol use: Not Currently    Comment: RARE   Drug use: No   Sexual activity: Not on file  Other Topics Concern   Not on file  Social History Narrative   Not on file   Social Drivers of Health   Financial Resource Strain: Low Risk  (12/24/2023)   Overall Financial Resource Strain (CARDIA)    Difficulty of Paying Living Expenses: Not hard at all  Food Insecurity: No Food Insecurity (12/24/2023)   Hunger Vital Sign    Worried About Running Out of Food in the Last Year: Never true    Ran Out of Food in the Last Year: Never true  Transportation Needs: No Transportation Needs (12/24/2023)   PRAPARE - Administrator, Civil Service (Medical): No    Lack of Transportation (Non-Medical): No  Physical Activity: Sufficiently Active (12/24/2023)   Exercise Vital Sign    Days of Exercise per Week: 7 days    Minutes of Exercise per Session: 30 min  Stress: No Stress Concern Present (12/24/2023)   [employer] of Occupational Health - Occupational Stress Questionnaire     Feeling of Stress : Not at all  Social Connections: Moderately Integrated (12/24/2023)   Social Connection and Isolation Panel [NHANES]    Frequency of Communication with Friends and Family: More than three times a week    Frequency of Social Gatherings with Friends and Family: Three times a week    Attends Religious Services: More than 4 times per year    Active Member of Clubs or Organizations: No    Attends Banker Meetings: Never    Marital Status: Married  Catering manager Violence: Not At Risk (12/24/2023)   Humiliation, Afraid, Rape, and Kick questionnaire    Fear of Current or Ex-Partner: No    Emotionally Abused: No    Physically Abused: No    Sexually Abused: No    Review of Systems   General: Negative for anorexia, weight loss, fever, chills, fatigue, weakness. Eyes: Negative for vision changes.  ENT: Negative for hoarseness, difficulty swallowing , nasal congestion. CV: Negative for chest pain, angina, palpitations, dyspnea on exertion, peripheral edema.  Respiratory: Negative for dyspnea at rest, dyspnea on exertion, cough, sputum, wheezing.  GI: See history of present illness. GU:  Negative for dysuria, hematuria, urinary incontinence, urinary frequency, nocturnal urination.  MS: Positive for bilateral knee pain.  No low back pain.  Derm: Negative for rash or itching.  Neuro: Negative for weakness, abnormal sensation, seizure, frequent headaches, memory loss,  confusion.  Psych: Negative for anxiety, depression, suicidal ideation, hallucinations.  Endo: Negative for unusual weight change.  Heme: Negative for bruising or bleeding. Allergy:  Negative for rash or hives.  Physical Exam   BP 136/85 (BP Location: Right Arm, Patient Position: Sitting, Cuff Size: Large)   Pulse 76   Temp 98.4 F (36.9 C) (Oral)   Ht 6\' 2"  (1.88 m)   Wt 257 lb 12.8 oz (116.9 kg)   SpO2 95%   BMI 33.10 kg/m    General: Well-nourished, well-developed in no acute distress.   Head: Normocephalic, atraumatic.   Eyes: Conjunctiva pink, no icterus. Mouth: Oropharyngeal mucosa moist and pink  Neck: Supple without thyromegaly, masses, or lymphadenopathy.  Lungs: Clear to auscultation bilaterally.  Heart: Regular rate and rhythm, no murmurs rubs or gallops.  Abdomen: Bowel sounds are normal, nontender, nondistended, no hepatosplenomegaly or masses,  no abdominal bruits or hernia, no rebound or guarding.  Rectus diastases Rectal: Not performed Extremities: No lower extremity edema. No clubbing or deformities.  Neuro: Alert and oriented x 4 , grossly normal neurologically.  Skin: Warm and dry, no rash or jaundice.   Psych: Alert and cooperative, normal mood and affect.  Labs   See HPI Imaging Studies   US Abdomen Complete Result Date: 12/22/2023 : PROCEDURE: ULTRASOUND ABDOMEN COMPLETE HISTORY: Patient is a 68 y/o Male with elevated liver enzymes. Cholecystectomy. Appendectomy. COMPARISON: CT [facility] 07/28/23 TECHNIQUE: Two-dimensional grayscale and color Doppler ultrasound of the abdomen was performed. FINDINGS: The pancreas demonstrates a normal homogenous echotexture with the tail suboptimally visualized due to overlying bowel gas. The liver demonstrates an increased echotexture without intrahepatic biliary dilatation. No masses are visualized. The main portal vein demonstrates normal hepatopedal flow. The gallbladder is surgically absent. The common bile duct measures 0.6 cm. The right kidney measures 14.2 cm in length. Renal cortical echotexture is normal. There is no hydronephrosis. There are no stones. There are multiple simple cysts with the largest measuring 1.1 cm. The left kidney measures 14.1 cm in length. Renal cortical echotexture is normal. There is no hydronephrosis. There are no stones. There are multiple simple cysts with the largest measuring 3.7 cm. The spleen is enlarged and measures 14.7 cm in length. It demonstrates a normal echotexture. There is no  evidence of aneurysm within the visualized segments of the abdominal aorta. The visualized segments of the IVC are unremarkable. IMPRESSION: 1. Increased hepatic echotexture, most commonly seen with steatosis. Correlation with LFT's is recommended. 2.  Surgically absent gallbladder.  No biliary dilatation. 3.  Multiple renal cysts. 4.  Splenomegaly. Thank you for allowing [place] to assist in the care of this patient. Electronically Signed   By: Lestine Box M.D.   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) -Discussed potential for progression to cirrhosis  -Complete serologies to rule out other etiologies, obtain noninvasive fibrosis staging -Recommend increasing physical activity, 10% body weight reduction initially -Continue to manage hyperlipidemia/hypertriglyceridemia -Screen for diabetes -Continue to limit alcohol consumption -Consider healthy weight and wellness referral -Office visit in 4 months  Personal history of adenomatous colon polyps: -Overdue for surveillance -Patient wants to schedule, wife has total knee replacement planned for this week, he will call when he is able to schedule his colonoscopy after her recovery.  He is aware 4 to 6-week waiting list so he will allow adequate time to be is ready to schedule  Leanna Battles. Melvyn Neth, MHS, PA-C [facility]

## 2024-01-06 NOTE — Patient Instructions (Signed)
 Please complete labs at [facility]. We will be in touch with results. You are overdue for colonoscopy given number of polyps removed in 2019. Please call when you are ready to schedule your colonoscopy. We are typically booked out about 4-6 weeks. Call if you are interested in referral to [facility].  Instructions for fatty liver: Recommend 1-2# weight loss per week until ideal body weight through exercise & diet. Low fat/cholesterol diet.   Avoid sweets, sodas, fruit juices, sweetened beverages like tea, etc. Gradually increase exercise from 15 min daily up to 1 hr per day 5 days/week. Limit alcohol use.

## 2024-01-19 DIAGNOSIS — Z860101 Personal history of adenomatous and serrated colon polyps: Secondary | ICD-10-CM | POA: Diagnosis not present

## 2024-01-19 DIAGNOSIS — R748 Abnormal levels of other serum enzymes: Secondary | ICD-10-CM | POA: Diagnosis not present

## 2024-01-19 DIAGNOSIS — R161 Splenomegaly, not elsewhere classified: Secondary | ICD-10-CM | POA: Diagnosis not present

## 2024-01-19 DIAGNOSIS — K760 Fatty (change of) liver, not elsewhere classified: Secondary | ICD-10-CM | POA: Diagnosis not present

## 2024-01-20 ENCOUNTER — Telehealth: Payer: Self-pay

## 2024-01-20 LAB — NASH FIBROSURE(R) PLUS

## 2024-01-20 NOTE — Telephone Encounter (Signed)
 Called Pt to ask him if he was still taking tamsulosin due to a Rx refill request. Per last office visit note he is supposed to be weaning hisself off the medication.

## 2024-01-22 ENCOUNTER — Other Ambulatory Visit: Payer: Self-pay

## 2024-01-22 DIAGNOSIS — N200 Calculus of kidney: Secondary | ICD-10-CM

## 2024-01-22 DIAGNOSIS — N201 Calculus of ureter: Secondary | ICD-10-CM

## 2024-01-22 LAB — NASH FIBROSURE(R) PLUS
ALPHA 2-MACROGLOBULINS, QN: 375 mg/dL — ABNORMAL HIGH (ref 110–276)
ALT (SGPT) P5P: 69 IU/L — ABNORMAL HIGH (ref 0–55)
AST (SGOT) P5P: 51 IU/L — ABNORMAL HIGH (ref 0–40)
Apolipoprotein A-1: 142 mg/dL (ref 101–178)
Bilirubin, Total: 0.2 mg/dL (ref 0.0–1.2)
Cholesterol, Total: 143 mg/dL (ref 100–199)
Fibrosis Score: 0.55 — ABNORMAL HIGH (ref 0.00–0.21)
GGT: 41 IU/L (ref 0–65)
Glucose: 105 mg/dL — ABNORMAL HIGH (ref 70–99)
Haptoglobin: 88 mg/dL (ref 32–363)
NASH Score: 0.83 — ABNORMAL HIGH (ref 0.00–0.25)
Steatosis Score: 0.5 — ABNORMAL HIGH (ref 0.00–0.40)
Triglycerides: 183 mg/dL — ABNORMAL HIGH (ref 0–149)

## 2024-01-22 LAB — CBC WITH DIFFERENTIAL/PLATELET
Basophils Absolute: 0.1 10*3/uL (ref 0.0–0.2)
Basos: 1 %
EOS (ABSOLUTE): 0.3 10*3/uL (ref 0.0–0.4)
Eos: 5 %
Hematocrit: 47.4 % (ref 37.5–51.0)
Hemoglobin: 16 g/dL (ref 13.0–17.7)
Immature Grans (Abs): 0 10*3/uL (ref 0.0–0.1)
Immature Granulocytes: 0 %
Lymphocytes Absolute: 2 10*3/uL (ref 0.7–3.1)
Lymphs: 29 %
MCH: 31 pg (ref 26.6–33.0)
MCHC: 33.8 g/dL (ref 31.5–35.7)
MCV: 92 fL (ref 79–97)
Monocytes Absolute: 0.7 10*3/uL (ref 0.1–0.9)
Monocytes: 11 %
Neutrophils Absolute: 3.7 10*3/uL (ref 1.4–7.0)
Neutrophils: 54 %
Platelets: 238 10*3/uL (ref 150–450)
RBC: 5.16 x10E6/uL (ref 4.14–5.80)
RDW: 12.8 % (ref 11.6–15.4)
WBC: 6.9 10*3/uL (ref 3.4–10.8)

## 2024-01-22 LAB — IRON,TIBC AND FERRITIN PANEL
Ferritin: 425 ng/mL — ABNORMAL HIGH (ref 30–400)
Iron Saturation: 28 % (ref 15–55)
Iron: 84 ug/dL (ref 38–169)
Total Iron Binding Capacity: 300 ug/dL (ref 250–450)
UIBC: 216 ug/dL (ref 111–343)

## 2024-01-22 LAB — COMPREHENSIVE METABOLIC PANEL
ALT: 61 IU/L — ABNORMAL HIGH (ref 0–44)
AST: 44 IU/L — ABNORMAL HIGH (ref 0–40)
Albumin: 4.3 g/dL (ref 3.9–4.9)
Alkaline Phosphatase: 64 IU/L (ref 44–121)
BUN/Creatinine Ratio: 13 (ref 10–24)
BUN: 17 mg/dL (ref 8–27)
Bilirubin Total: 0.4 mg/dL (ref 0.0–1.2)
CO2: 25 mmol/L (ref 20–29)
Calcium: 9.7 mg/dL (ref 8.6–10.2)
Chloride: 104 mmol/L (ref 96–106)
Creatinine, Ser: 1.31 mg/dL — ABNORMAL HIGH (ref 0.76–1.27)
Globulin, Total: 2.9 g/dL (ref 1.5–4.5)
Glucose: 101 mg/dL — ABNORMAL HIGH (ref 70–99)
Potassium: 4.1 mmol/L (ref 3.5–5.2)
Sodium: 142 mmol/L (ref 134–144)
Total Protein: 7.2 g/dL (ref 6.0–8.5)
eGFR: 60 mL/min/{1.73_m2} (ref >59)

## 2024-01-22 LAB — CERULOPLASMIN: Ceruloplasmin: 15.8 mg/dL — ABNORMAL LOW (ref 16.0–31.0)

## 2024-01-22 LAB — MITOCHONDRIAL/SMOOTH MUSCLE AB PNL: Smooth Muscle Ab: 15 U (ref 0–19)

## 2024-01-22 LAB — PROTIME-INR
INR: 1.1 (ref 0.9–1.2)
Prothrombin Time: 11.9 s (ref 9.1–12.0)

## 2024-01-22 LAB — ALPHA-1-ANTITRYPSIN PHENOTYP: A-1 Antitrypsin: 137 mg/dL (ref 101–187)

## 2024-01-22 LAB — IGG, IGA, IGM
IgA/Immunoglobulin A, Serum: 158 mg/dL (ref 61–437)
IgG (Immunoglobin G), Serum: 1403 mg/dL (ref 603–1613)
IgM (Immunoglobulin M), Srm: 276 mg/dL — ABNORMAL HIGH (ref 20–172)

## 2024-01-22 LAB — HEMOGLOBIN A1C
Est. average glucose Bld gHb Est-mCnc: 123 mg/dL
Hgb A1c MFr Bld: 5.9 % — ABNORMAL HIGH (ref 4.8–5.6)

## 2024-01-22 LAB — TISSUE TRANSGLUTAMINASE, IGA: Transglutaminase IgA: <2 U/mL (ref 0–3)

## 2024-01-22 LAB — ANA: Anti Nuclear Antibody (ANA): POSITIVE — AB

## 2024-01-22 MED ORDER — TAMSULOSIN HCL 0.4 MG PO CAPS: 0.4000 mg | ORAL_CAPSULE | Freq: Every day | ORAL | 3 refills | Status: AC

## 2024-01-22 NOTE — Telephone Encounter (Signed)
 Patient stopped by to ask if he needed to continue taking tamsulosin. Was advised to discontinue per previous message.  Expressed no issues.

## 2024-01-26 DIAGNOSIS — M79675 Pain in left toe(s): Secondary | ICD-10-CM | POA: Diagnosis not present

## 2024-01-26 DIAGNOSIS — M79674 Pain in right toe(s): Secondary | ICD-10-CM | POA: Diagnosis not present

## 2024-01-26 DIAGNOSIS — B351 Tinea unguium: Secondary | ICD-10-CM | POA: Diagnosis not present

## 2024-02-09 ENCOUNTER — Other Ambulatory Visit: Payer: Self-pay | Admitting: *Deleted

## 2024-02-09 ENCOUNTER — Other Ambulatory Visit: Payer: Self-pay

## 2024-02-09 DIAGNOSIS — R161 Splenomegaly, not elsewhere classified: Secondary | ICD-10-CM

## 2024-02-09 DIAGNOSIS — K760 Fatty (change of) liver, not elsewhere classified: Secondary | ICD-10-CM

## 2024-02-09 DIAGNOSIS — R748 Abnormal levels of other serum enzymes: Secondary | ICD-10-CM

## 2024-02-19 ENCOUNTER — Ambulatory Visit (HOSPITAL_COMMUNITY)
Admission: RE | Admit: 2024-02-19 | Discharge: 2024-02-19 | Disposition: A | Discharge status: Home / Self Care | Source: Ambulatory Visit | Attending: Gastroenterology | Admitting: Gastroenterology

## 2024-02-19 DIAGNOSIS — K760 Fatty (change of) liver, not elsewhere classified: Secondary | ICD-10-CM | POA: Diagnosis not present

## 2024-02-19 DIAGNOSIS — K759 Inflammatory liver disease, unspecified: Secondary | ICD-10-CM | POA: Diagnosis not present

## 2024-02-19 DIAGNOSIS — Z944 Liver transplant status: Secondary | ICD-10-CM | POA: Diagnosis not present

## 2024-02-19 DIAGNOSIS — R748 Abnormal levels of other serum enzymes: Secondary | ICD-10-CM | POA: Diagnosis not present

## 2024-02-19 DIAGNOSIS — R932 Abnormal findings on diagnostic imaging of liver and biliary tract: Secondary | ICD-10-CM | POA: Diagnosis not present

## 2024-02-19 DIAGNOSIS — K709 Alcoholic liver disease, unspecified: Secondary | ICD-10-CM | POA: Diagnosis not present

## 2024-02-19 DIAGNOSIS — R161 Splenomegaly, not elsewhere classified: Secondary | ICD-10-CM | POA: Diagnosis not present

## 2024-02-24 DIAGNOSIS — K760 Fatty (change of) liver, not elsewhere classified: Secondary | ICD-10-CM | POA: Diagnosis not present

## 2024-02-24 DIAGNOSIS — R161 Splenomegaly, not elsewhere classified: Secondary | ICD-10-CM | POA: Diagnosis not present

## 2024-02-24 LAB — HEMOCHROMATOSIS DNA-PCR(C282Y,H63D)

## 2024-02-24 LAB — HEPATITIS A ANTIBODY, TOTAL: hep A Total Ab: NEGATIVE

## 2024-02-24 LAB — ENHANCED LIVER FIBROSIS (ELF): ELF(TM) Score: 12.06 — ABNORMAL HIGH (ref <9.80)

## 2024-02-24 LAB — HEPATITIS B SURFACE ANTIBODY,QUALITATIVE: Hep B Surface Ab, Qual: REACTIVE

## 2024-02-29 LAB — COPPER, URINE - RANDOM OR 24 HOUR
Copper / Creatinine Ratio: 10 ug/g{creat} (ref 0–49)
Copper, 24H Ur: 25 ug/(24.h) (ref 3–35)
Copper, Ur: 14 ug/L
Creatinine(Crt),U: 1.44 g/L (ref 0.30–3.00)

## 2024-03-04 ENCOUNTER — Ambulatory Visit: Payer: Medicare Other | Admitting: Urology

## 2024-03-08 ENCOUNTER — Telehealth: Payer: Self-pay

## 2024-03-08 DIAGNOSIS — M1712 Unilateral primary osteoarthritis, left knee: Secondary | ICD-10-CM | POA: Diagnosis not present

## 2024-03-08 NOTE — Telephone Encounter (Signed)
 LMTCB 03/08/24 1st attempt

## 2024-03-08 NOTE — Telephone Encounter (Signed)
 LMTCB 03/08/24 1st attempt [facility]

## 2024-03-08 NOTE — Telephone Encounter (Signed)
   Pre-operative Risk Assessment    Patient Name: [name]  DOB: [birth year] MRN: [id number]   Date of last office visit: 04/24/2023 Dr. Dinah Franco, MD Date of next office visit: NONE   Request for Surgical Clearance    Procedure:   Left Total Knee Arthroplasty  Date of Surgery:  Clearance TBD                                Surgeon:  Dr. Adonica Hoose, MD Surgeon's Group or Practice Name: [facility] Phone number: [phone] Fax number: [phone]   Type of Clearance Requested:   - Medical  - Pharmacy:  Hold Aspirin      Type of Anesthesia:  Spinal   Additional requests/questions:    SignedDelroy Fields   03/08/2024, 12:04 PM

## 2024-03-08 NOTE — Telephone Encounter (Signed)
   Name: [name]  DOB: December 29, 1955  MRN: [id number]  Primary Cardiologist: Hazle Lites, MD  Chart reviewed as part of pre-operative protocol coverage. Because of [name]'s past medical history and time since last visit, he will require a follow-up in-office visit in order to better assess preoperative cardiovascular risk.  Pre-op covering staff: - Please schedule appointment and call patient to inform them. If patient already had an upcoming appointment within acceptable timeframe, please add "pre-op clearance" to the appointment notes so provider is aware. - Please contact requesting surgeon's office via preferred method (i.e, phone, fax) to inform them of need for appointment prior to surgery.   Jude Norton, NP  03/08/2024, 12:22 PM

## 2024-03-11 ENCOUNTER — Ambulatory Visit: Payer: Medicare Other | Admitting: Urology

## 2024-03-11 ENCOUNTER — Encounter: Payer: Self-pay | Admitting: Urology

## 2024-03-11 VITALS — BP 129/77 | HR 83

## 2024-03-11 DIAGNOSIS — N281 Cyst of kidney, acquired: Secondary | ICD-10-CM

## 2024-03-11 DIAGNOSIS — R3129 Other microscopic hematuria: Secondary | ICD-10-CM | POA: Diagnosis not present

## 2024-03-11 DIAGNOSIS — N200 Calculus of kidney: Secondary | ICD-10-CM

## 2024-03-11 DIAGNOSIS — N201 Calculus of ureter: Secondary | ICD-10-CM | POA: Diagnosis not present

## 2024-03-11 LAB — URINALYSIS, ROUTINE W REFLEX MICROSCOPIC
Bilirubin, UA: NEGATIVE
Glucose, UA: NEGATIVE
Ketones, UA: NEGATIVE
Leukocytes,UA: NEGATIVE
Nitrite, UA: NEGATIVE
Protein,UA: NEGATIVE
Specific Gravity, UA: 1.01 (ref 1.005–1.030)
Urobilinogen, Ur: 0.2 mg/dL (ref 0.2–1.0)
pH, UA: 6 (ref 5.0–7.5)

## 2024-03-11 LAB — MICROSCOPIC EXAMINATION

## 2024-03-11 NOTE — Progress Notes (Unsigned)
 Subjective: 1. Renal calculi   2. Renal cyst   3. Microhematuria    03/11/24: [name] returns today in f/u.  He had an abdominal US  for abn LFT's on 12/22/23 and was found to have bilateral simple renal cysts but no stones.  CT in 9/24 demonstrated a 2mm non-obstructing right renal stone. His IPSS is 2 with some frequency and urgency.  He has had no hematuria or flank pain.  UA has 3-10 RBC's which is stable.    02/27/23: [name] returns today in f/u.  He has no more pain or hematuria.  He had no obstruction on a renal US  and the KUB today is clear.  He has a left renal cyst.  His IPSS is 1.  UA has 3-10 RBC's.    01/09/23: [name] had right ureteral ESWL on 12/18/22.  He hasn't see a lot pass but did see a few flecks.  He had some suprapubic pain on 12/19/22 and has had no pain since then.  He has no significant LUTS or hematuria.  His IPSS today is 1.    11/28/22: [name] is a 68 yo WM who was in the ER on Sunday with right flank pain that was severe with nausea.   He had microhematuria.  He was found to have 2 stones in the left lower proximal/mid ureter with obstruction. The largest was 10x42mm.   He last took a pain pill this morning.   He has had prior ESWL remotely.  He had a spermatocelectomy in 2015 by Dr. Ottelin.  His has some frequency but no urgency.  His IPSS is 11.  He was hospitalized in July for dehydration and has drinking increased fluid.  He doesn't feel he empties all the way.  His Cr was 1.72 on 11/24/22 which is up from 1.23 in 11/23.   His UA had 50 RBC's.  He had a syncopal episode in 11/23 and was evaluated by cardiology and he was found to have CAD with marked coronary calcification.  He had mild Aortic insufficiency.  The stones are visible in the right mid ureter on KUB today.   His UA has 3-10 RBC's.   ROS:  Review of Systems  Cardiovascular:  Positive for leg swelling (left foot intermittent).  Musculoskeletal:  Positive for joint pain (knees).  All other systems reviewed and are  negative.   No Known Allergies  Past Medical History:  Diagnosis Date   History of kidney stones    Hypertension    Hypertriglyceridemia    OSA on CPAP    POST  SLEEP APNEA SURGERY --- PER SLEEP STUDY 06-26-15-2006  MODERATE OSA   USES CPAP DAILY   Renal disorder    Scrotal swelling    Shortness of breath dyspnea    with exertion   Urgency of urination    OCCASIONAL    Past Surgical History:  Procedure Laterality Date   APPENDECTOMY     CHOLECYSTECTOMY N/A 08/07/2018   Procedure: LAPAROSCOPIC CHOLECYSTECTOMY;  Surgeon: Alanda Allegra, MD;  Location: [facility];  Service: General;  Laterality: N/A;   COLONOSCOPY     COLONOSCOPY N/A 06/05/2018   Procedure: COLONOSCOPY;  Surgeon: Suzette Espy, MD;  Location: [facility];  Service: Endoscopy;  Laterality: N/A;  7:30   DISTAL BICEPS TENDON REPAIR Right 01/30/2015   Procedure: DISTAL BICEPS TENDON REPAIR;  Surgeon: Adah Acron, MD;  Location: [facility];  Service: Orthopedics;  Laterality: Right;   EXPLORATION LAPAROTOMY/  PARTIAL COLECTOMY WITH RESECTION TERMINAL  ILEUM (APPENDICEAL MASS)  01-16-2010   BENIGN NEOPLASM APPENDIX   EXTRACORPOREAL SHOCK WAVE LITHOTRIPSY Right 12/17/2022   Procedure: EXTRACORPOREAL SHOCK WAVE LITHOTRIPSY (ESWL);  Surgeon: Marco Severs, MD;  Location: [facility];  Service: Urology;  Laterality: Right;   HYDROCELE EXCISION Right 03/18/2014   Procedure:  RIGHT SPERMACELECTOMY;  Surgeon: Alanson Alliance, MD;  Location: [facility];  Service: Urology;  Laterality: Right;   KNEE ARTHROSCOPY Right 2010   LITHOTRIPSY     LIVER BIOPSY  08/07/2018   Procedure: LIVER BIOPSY;  Surgeon: Alanda Allegra, MD;  Location: [facility];  Service: General;;   POLYPECTOMY  06/05/2018   Procedure: POLYPECTOMY;  Surgeon: Suzette Espy, MD;  Location: [facility];  Service: Endoscopy;;  ascending and descending   TONSILLECTOMY     UVULOPALATOPHARYNGOPLASTY  2000 (APPROX)   W/  DEVIATED SEPTUM REPAIR  AND TONSILLECTOMY     Social History   Socioeconomic History   Marital status: Married    Spouse name: Not on file   Number of children: Not on file   Years of education: Not on file   Highest education level: Not on file  Occupational History   Not on file  Tobacco Use   Smoking status: Never   Smokeless tobacco: Never  Vaping Use   Vaping status: Never Used  Substance and Sexual Activity   Alcohol use: Not Currently    Comment: RARE   Drug use: No   Sexual activity: Not on file  Other Topics Concern   Not on file  Social History Narrative   Not on file   Social Drivers of Health   Financial Resource Strain: Low Risk  (12/24/2023)   Overall Financial Resource Strain (CARDIA)    Difficulty of Paying Living Expenses: Not hard at all  Food Insecurity: No Food Insecurity (12/24/2023)   Hunger Vital Sign    Worried About Running Out of Food in the Last Year: Never true    Ran Out of Food in the Last Year: Never true  Transportation Needs: No Transportation Needs (12/24/2023)   PRAPARE - Administrator, Civil Service (Medical): No    Lack of Transportation (Non-Medical): No  Physical Activity: Sufficiently Active (12/24/2023)   Exercise Vital Sign    Days of Exercise per Week: 7 days    Minutes of Exercise per Session: 30 min  Stress: No Stress Concern Present (12/24/2023)   [employer] of Occupational Health - Occupational Stress Questionnaire    Feeling of Stress : Not at all  Social Connections: Moderately Integrated (12/24/2023)   Social Connection and Isolation Panel [NHANES]    Frequency of Communication with Friends and Family: More than three times a week    Frequency of Social Gatherings with Friends and Family: Three times a week    Attends Religious Services: More than 4 times per year    Active Member of Clubs or Organizations: No    Attends Banker Meetings: Never    Marital Status: Married  Catering manager Violence: Not At Risk (12/24/2023)    Humiliation, Afraid, Rape, and Kick questionnaire    Fear of Current or Ex-Partner: No    Emotionally Abused: No    Physically Abused: No    Sexually Abused: No    Family History  Problem Relation Age of Onset   Heart attack Mother    Heart attack Father    Heart attack Maternal Uncle    Colon cancer Neg Hx  Cirrhosis Neg Hx     Anti-infectives: Anti-infectives (From admission, onward)    None       Current Outpatient Medications  Medication Sig Dispense Refill   aspirin  EC 81 MG tablet Take 1 tablet (81 mg total) by mouth daily. Swallow whole. 90 tablet 3   fenofibrate  160 MG tablet Take 160 mg by mouth daily.      folic acid  (FOLVITE ) 1 MG tablet Take 1 mg by mouth daily.     furosemide (LASIX) 20 MG tablet Take 20 mg by mouth daily.     ketoconazole (NIZORAL) 2 % cream daily.     olmesartan  (BENICAR ) 20 MG tablet Take 1 tablet (20 mg total) by mouth daily. 90 tablet 3   Omega-3 Fatty Acids (FISH OIL) 1200 MG CAPS Take 1,200 mg by mouth daily.     rosuvastatin  (CRESTOR ) 5 MG tablet TAKE 1 TABLET(5 MG) BY MOUTH DAILY 90 tablet 1   tamsulosin  (FLOMAX ) 0.4 MG CAPS capsule Take 1 capsule (0.4 mg total) by mouth daily. 90 capsule 3   traZODone  (DESYREL ) 50 MG tablet Take 50 mg by mouth at bedtime.     Vitamin D, Ergocalciferol, (DRISDOL) 1.25 MG (50000 UNIT) CAPS capsule Take 50,000 Units by mouth once a week.     No current facility-administered medications for this visit.     Objective: Vital signs in last 24 hours: BP 129/77   Intake/Output from previous day: No intake/output data recorded. Intake/Output this shift: @IOTHISSHIFT @   Physical Exam Vitals reviewed.  Constitutional:      Appearance: Normal appearance.  Neurological:     Mental Status: He is alert.     Lab Results:  No results found for this or any previous visit (from the past 24 hours).    BMET No results for input(s): "NA", "K", "CL", "CO2", "GLUCOSE", "BUN", "CREATININE", "CALCIUM "  in the last 72 hours. PT/INR No results for input(s): "LABPROT", "INR" in the last 72 hours. ABG No results for input(s): "PHART", "HCO3" in the last 72 hours.  Invalid input(s): "PCO2", "PO2" UA has 3-10 RBC's.  No results found for this or any previous visit (from the past 72 hours).    Studies/Results:  No results found. US  ELASTOGRAPHY LIVER Result Date: 02/27/2024 CLINICAL DATA:  Elevated liver enzymes.  Advanced fibrosis. EXAM: US  ELASTOGRAPHY HEPATIC TECHNIQUE: Sonography of the liver was performed. In addition, ultrasound elastography evaluation of the liver was performed. A region of interest was placed within the right lobe of the liver. Following application of a compressive sonographic pulse, tissue compressibility was assessed. Multiple assessments were performed at the selected site. Median tissue compressibility was determined. Previously, hepatic stiffness was assessed by shear wave velocity. Based on recently published Society of Radiologists in Ultrasound consensus article, reporting is now recommended to be performed in the SI units of pressure (kiloPascals) representing hepatic stiffness/elasticity. The obtained result is compared to the published reference standards. (cACLD = compensated Advanced Chronic Liver Disease) COMPARISON:  December 22, 2023 FINDINGS: Liver: No focal lesion. Increased echotexture. Portal vein is patent on color Doppler imaging with normal direction of blood flow towards the liver. ULTRASOUND HEPATIC ELASTOGRAPHY Device: Siemens Helix VTQ Patient position: Oblique Transducer: DAX Number of measurements: 12 Hepatic segment:  8 Median kPa: 3.7 IQR: 1.2 IQR/Median kPa ratio: 0.32 Data quality: IQR/Median kPa ratio of 0.3 or greater indicates reduced accuracy Diagnostic category:  < or = 5 kPa: high probability of being normal The use of hepatic elastography is applicable to patients  with viral hepatitis and non-alcoholic fatty liver disease. At this time,  there is insufficient data for the referenced cut-off values and use in other causes of liver disease, including alcoholic liver disease. Patients, however, may be assessed by elastography and serve as their own reference standard/baseline. In patients with non-alcoholic liver disease, the values suggesting compensated advanced chronic liver disease (cACLD) may be lower, and patients may need additional testing with elasticity results of 7-9 kPa. Please note that abnormal hepatic elasticity and shear wave velocities may also be identified in clinical settings other than with hepatic fibrosis, such as: acute hepatitis, elevated right heart and central venous pressures including use of beta blockers, veno-occlusive disease (Budd-Chiari), infiltrative processes such as mastocytosis/amyloidosis/infiltrative tumor/lymphoma, extrahepatic cholestasis, with hyperemia in the post-prandial state, and with liver transplantation. Correlation with patient history, laboratory data, and clinical condition recommended. Diagnostic Categories: < or =5 kPa: high probability of being normal < or =9 kPa: in the absence of other known clinical signs, rules out cACLD >9 kPa and ?13 kPa: suggestive of cACLD, but needs further testing >13 kPa: highly suggestive of cACLD > or =17 kPa: highly suggestive of cACLD with an increased probability of clinically significant portal hypertension IMPRESSION: ULTRASOUND LIVER: Fatty liver. ULTRASOUND HEPATIC ELASTOGRAPHY: Median kPa:  3.7 Diagnostic category:  < or = 5 kPa: high probability of being normal In the setting of elevated liver function tests, non-fasting state, or vascular congestion, the stage of liver fibrosis may be overestimated. In some patients with NAFLD, the cut-off values for cACLD may be lower (7-9 kPa). In causes other than viral hepatitis and NAFLD, the cut-off values are not well established. Electronically Signed   By: Anna Barnes M.D.   On: 02/27/2024 12:25   US  Abdomen  Complete Result Date: 12/22/2023 : PROCEDURE: ULTRASOUND ABDOMEN COMPLETE HISTORY: Patient is a 68 y/o Male with elevated liver enzymes. Cholecystectomy. Appendectomy. COMPARISON: CT [facility] 07/28/23 TECHNIQUE: Two-dimensional grayscale and color Doppler ultrasound of the abdomen was performed. FINDINGS: The pancreas demonstrates a normal homogenous echotexture with the tail suboptimally visualized due to overlying bowel gas. The liver demonstrates an increased echotexture without intrahepatic biliary dilatation. No masses are visualized. The main portal vein demonstrates normal hepatopedal flow. The gallbladder is surgically absent. The common bile duct measures 0.6 cm. The right kidney measures 14.2 cm in length. Renal cortical echotexture is normal. There is no hydronephrosis. There are no stones. There are multiple simple cysts with the largest measuring 1.1 cm. The left kidney measures 14.1 cm in length. Renal cortical echotexture is normal. There is no hydronephrosis. There are no stones. There are multiple simple cysts with the largest measuring 3.7 cm. The spleen is enlarged and measures 14.7 cm in length. It demonstrates a normal echotexture. There is no evidence of aneurysm within the visualized segments of the abdominal aorta. The visualized segments of the IVC are unremarkable. IMPRESSION: 1. Increased hepatic echotexture, most commonly seen with steatosis. Correlation with LFT's is recommended. 2.  Surgically absent gallbladder.  No biliary dilatation. 3.  Multiple renal cysts. 4.  Splenomegaly. Thank you for allowing us  to assist in the care of this patient. Electronically Signed   By: Beula Brunswick M.D.   On: 12/22/2023 19:59   CT from 9/24 reviewed.    Assessment/Plan: Right renal stone 2mm without obstruction on CT in 9/24. He remains on tamsulosin  and is voiding well.  BPH with BOO.  Continue tamsulosin .  That was refilled by me on 01/22/24.   Microhematuria.  He  still has a few RBC's.  Renal  cysts.  He has benign bilateral renal cysts and I reassured him about those.     No orders of the defined types were placed in this encounter.    Orders Placed This Encounter  Procedures   Urinalysis, Routine w reflex microscopic     Return in about 1 year (around 03/11/2025) for any available provider. .    CC: Dr. Kathyleen Parkins.      Homero Luster 03/11/2024

## 2024-03-11 NOTE — Telephone Encounter (Signed)
 Spoke with pt.  Per pt, he was advised that his surgeon is out of medical leave and he has been referred to a new surgeon, Dr. Bernard Brick, which he sees 05/14/24.  Did not schedule pt for yearly as of yet, will wait til sees new surgeon so we will know when surgery will be planned.  Will send back to [facility] to have them resend clearance after pt sees new surgeon.

## 2024-03-15 DIAGNOSIS — M170 Bilateral primary osteoarthritis of knee: Secondary | ICD-10-CM | POA: Diagnosis not present

## 2024-03-15 DIAGNOSIS — H52223 Regular astigmatism, bilateral: Secondary | ICD-10-CM | POA: Diagnosis not present

## 2024-04-07 DIAGNOSIS — G4733 Obstructive sleep apnea (adult) (pediatric): Secondary | ICD-10-CM | POA: Diagnosis not present

## 2024-04-10 NOTE — Progress Notes (Signed)
 COVID Vaccine received:  []  No [x]  Yes Date of any COVID positive Test in last 90 days: none  PCP - Kathyleen Parkins, MD [phone]  Cardiologist - K. [place] Hilty, MD  Clearance appt not RS yet, patient cancelled appt 05-14-24, Per patient, Kim Pen, PA has resubmitted the request for a preop clearance appt.   Chest x-ray - 07-28-2023  2v  Epic EKG - 04-24-2023 Epic  will repeat   04-13-24 Stress Test - 08-20-2021  Epic ECHO - 10-22-2023  Epic Cardiac Cath -  CT Coronary Calcium  score: 4017  on 10-17-22  Pacemaker / ICD device [x]  No []  Yes   Spinal Cord Stimulator:[x]  No []  Yes       History of Sleep Apnea? []  No [x]  Yes   CPAP used?- []  No [x]  Yes    Does the patient monitor blood sugar?   []  N/A   [x]  No []  Yes  Patient has: []  NO Hx DM   [x]  Pre-DM   []  DM1  []   DM2 Last A1c was:5.9 on 01-19-2024    No meds      Blood Thinner / Instructions:  none Aspirin  Instructions:  ASA 81mg    Hold x 5 days per patient  ERAS Protocol Ordered: []  No  [x]  Yes PRE-SURGERY []  ENSURE  [x]  G2   Patient is to be NPO after: 0815  Dental hx: []  Dentures:  [x]  N/A      []  Bridge or Partial:                   []  Loose or Damaged teeth:   Comments: Patient was given the 5 CHG shower / bath instructions for TKA surgery along with 2 bottles of the CHG soap. Patient will start this on:     04-18-24         Activity level: Able to walk up 2 flights of stairs without becoming significantly short of breath or having chest pain?  []  No   [x]    Yes   Anesthesia review: CAD, aortic valve insuff, ?LFTs, fatty liver, splenomegaly, HTN, OSA- CPAP  Patient denies shortness of breath, fever, cough and chest pain at PAT appointment.  Patient verbalized understanding and agreement to the Pre-Surgical Instructions that were given to them at this PAT appointment. Patient was also educated of the need to review these PAT instructions again prior to his surgery.I reviewed the appropriate phone numbers to call if they have  any and questions or concerns.

## 2024-04-10 NOTE — Patient Instructions (Signed)
 SURGICAL WAITING ROOM VISITATION Patients having surgery or a procedure may have no more than 2 support people in the waiting area - these visitors may rotate in the visitor waiting room.   If the patient needs to stay at the hospital during part of their recovery, the visitor guidelines for inpatient rooms apply.  PRE-OP VISITATION  Pre-op nurse will coordinate an appropriate time for 1 support person to accompany the patient in pre-op.  This support person may not rotate.  This visitor will be contacted when the time is appropriate for the visitor to come back in the pre-op area.  Please refer to the [facility] website for the visitor guidelines for Inpatients (after your surgery is over and you are in a regular room).  You are not required to quarantine at this time prior to your surgery. However, you must do this: Hand Hygiene often Do NOT share personal items Notify your provider if you are in close contact with someone who has COVID or you develop fever 100.4 or greater, new onset of sneezing, cough, sore throat, shortness of breath or body aches.  If you test positive for Covid or have been in contact with anyone that has tested positive in the last 10 days please notify you surgeon.    Your procedure is scheduled on:  THURSDAY  April 22, 2024  Report to [facility] Main Entrance: [address] entrance where the [employer] is available.   Report to admitting at:  08:45   AM  Call this number if you have any questions or problems the morning of surgery [phone]  Do not eat food after Midnight the night prior to your surgery/procedure.  After Midnight you may have the following liquids until 08:15 AM DAY OF SURGERY  Clear Liquid Diet Water  Black Coffee (sugar ok, NO MILK/CREAM OR CREAMERS)  Tea (sugar ok, NO MILK/CREAM OR CREAMERS) regular and decaf                             Plain Jell-O  with no fruit (NO RED)                                           Fruit ices  (not with fruit pulp, NO RED)                                     Popsicles (NO RED)                                                                  Juice: NO CITRUS JUICES: only apple, WHITE grape, WHITE cranberry Sports drinks like Gatorade or Powerade (NO RED)                   The day of surgery:  Drink ONE (1) Pre-Surgery G2 at  8:15 AM the morning of surgery. Drink in one sitting. Do not sip.  This drink was given to you during your hospital pre-op appointment visit. Nothing else to drink after completing the Pre-Surgery  G2 : No candy, chewing gum or throat lozenges.    FOLLOW ANY ADDITIONAL PRE OP INSTRUCTIONS YOU RECEIVED FROM YOUR SURGEON'S OFFICE!!!   Oral Hygiene is also important to reduce your risk of infection.        Remember - BRUSH YOUR TEETH THE MORNING OF SURGERY WITH YOUR REGULAR TOOTHPASTE  Do NOT smoke after Midnight the night before surgery.  STOP TAKING all Vitamins, Herbs and supplements 1 week before your surgery.   Take ONLY these medicines the morning of surgery with A SIP OF WATER : tamsulosin . You may use your Systane Eye drops if needed.   If You have been diagnosed with Sleep Apnea - Bring CPAP mask and tubing day of surgery. We will provide you with a CPAP machine on the day of your surgery.                   You may not have any metal on your body including  jewelry, and body piercing  Do not wear lotions, powders, cologne, or deodorant  Men may shave face and neck.  Contacts, Hearing Aids, dentures or bridgework may not be worn into surgery. DENTURES WILL BE REMOVED PRIOR TO SURGERY PLEASE DO NOT APPLY "Poly grip" OR ADHESIVES!!!  Patients discharged on the day of surgery will not be allowed to drive home.  Someone NEEDS to stay with you for the first 24 hours after anesthesia.  Do not bring your home medications to the hospital. The Pharmacy will dispense medications listed on your medication list to you during your admission in the  Hospital.  Special Instructions: Bring a copy of your healthcare power of attorney and living will documents the day of surgery, if you wish to have them scanned into your [facility] Records- EPIC  Please read over the following fact sheets you were given: IF YOU HAVE QUESTIONS ABOUT YOUR PRE-OP INSTRUCTIONS, PLEASE CALL [phone].     Pre-operative 5 CHG Bath Instructions   You can play a key role in reducing the risk of infection after surgery. Your skin needs to be as free of germs as possible. You can reduce the number of germs on your skin by washing with CHG (chlorhexidine  gluconate) soap before surgery. CHG is an antiseptic soap that kills germs and continues to kill germs even after washing.   DO NOT use if you have an allergy to chlorhexidine /CHG or antibacterial soaps. If your skin becomes reddened or irritated, stop using the CHG and notify one of our RNs at [phone]  Please shower with the CHG soap starting 4 days before surgery using the following schedule: START SHOWERS ON SUNDAY  April 18, 2024  Please keep in mind the following:  DO NOT shave, including legs and underarms, starting the day of your first shower.   You may shave your face at any point before/day of surgery.   Place clean sheets on your bed the day you start using CHG soap. Use a clean washcloth (not used since being washed) for each shower. DO NOT sleep with pets once you start using the CHG.   CHG Shower Instructions:  If you choose to wash your hair and private area, wash first with your normal shampoo/soap.  After you use shampoo/soap, rinse your hair and body thoroughly to remove shampoo/soap residue.  Turn the water  OFF and apply about 3 tablespoons (45 ml) of CHG soap to a CLEAN washcloth.  Apply CHG soap ONLY FROM  YOUR NECK DOWN TO YOUR TOES (washing for 3-5 minutes)  DO NOT use CHG soap on face, private areas, open wounds, or sores.  Pay special attention to the area where your surgery is being performed.  If you are having back surgery, having someone wash your back for you may be helpful.  Wait 2 minutes after CHG soap is applied, then you may rinse off the CHG soap.  Pat dry with a clean towel  Put on clean clothes/pajamas   If you choose to wear lotion, please use ONLY the CHG-compatible lotions on the back of this paper.     Additional instructions for the day of surgery: DO NOT APPLY any lotions, deodorants, cologne, or perfumes.   Put on clean/comfortable clothes.  Brush your teeth.  Ask your nurse before applying any prescription medications to the skin.      CHG Compatible Lotions   Aveeno Moisturizing lotion  Cetaphil Moisturizing Cream  Cetaphil Moisturizing Lotion  Clairol Herbal Essence Moisturizing Lotion, Dry Skin  Clairol Herbal Essence Moisturizing Lotion, Extra Dry Skin  Clairol Herbal Essence Moisturizing Lotion, Normal Skin  Curel Age Defying Therapeutic Moisturizing Lotion with Alpha Hydroxy  Curel Extreme Care Body Lotion  Curel Soothing Hands Moisturizing Hand Lotion  Curel Therapeutic Moisturizing Cream, Fragrance-Free  Curel Therapeutic Moisturizing Lotion, Fragrance-Free  Curel Therapeutic Moisturizing Lotion, Original Formula  Eucerin Daily Replenishing Lotion  Eucerin Dry Skin Therapy Plus Alpha Hydroxy Crme  Eucerin Dry Skin Therapy Plus Alpha Hydroxy Lotion  Eucerin Original Crme  Eucerin Original Lotion  Eucerin Plus Crme Eucerin Plus Lotion  Eucerin TriLipid Replenishing Lotion  Keri Anti-Bacterial Hand Lotion  Keri Deep Conditioning Original Lotion Dry Skin Formula Softly Scented  Keri Deep Conditioning Original Lotion, Fragrance Free Sensitive Skin Formula  Keri Lotion Fast Absorbing Fragrance Free Sensitive Skin Formula  Keri Lotion Fast  Absorbing Softly Scented Dry Skin Formula  Keri Original Lotion  Keri Skin Renewal Lotion Keri Silky Smooth Lotion  Keri Silky Smooth Sensitive Skin Lotion  Nivea Body Creamy Conditioning Oil  Nivea Body Extra Enriched Lotion  Nivea Body Original Lotion  Nivea Body Sheer Moisturizing Lotion Nivea Crme  Nivea Skin Firming Lotion  NutraDerm 30 Skin Lotion  NutraDerm Skin Lotion  NutraDerm Therapeutic Skin Cream  NutraDerm Therapeutic Skin Lotion  ProShield Protective Hand Cream  Provon moisturizing lotion   FAILURE TO FOLLOW THESE INSTRUCTIONS MAY RESULT IN THE CANCELLATION OF YOUR SURGERY  PATIENT SIGNATURE_________________________________  NURSE SIGNATURE__________________________________  ________________________________________________________________________        [name]    An incentive spirometer is a tool that can help keep your lungs clear and active. This tool measures how well you are filling your lungs with each breath.  Taking long deep breaths may help reverse or decrease the chance of developing breathing (pulmonary) problems (especially infection) following: A long period of time when you are unable to move or be active. BEFORE THE PROCEDURE  If the spirometer includes an indicator to show your best effort, your nurse or respiratory therapist will set it to a desired goal. If possible, sit up straight or lean slightly forward. Try not to slouch. Hold the incentive spirometer in an upright position. INSTRUCTIONS FOR USE  Sit on the edge of your bed if possible, or sit up as far as you can in bed or on a chair. Hold the incentive spirometer in an upright position. Breathe out normally. Place the mouthpiece in your mouth and seal your lips tightly around it. Breathe in slowly and as deeply as possible, raising the piston or the ball toward the top of the column. Hold your breath for 3-5 seconds or for as long as possible. Allow the piston or ball  to fall to the bottom of the column. Remove the mouthpiece from your mouth and breathe out normally. Rest for a few seconds and repeat Steps 1 through 7 at least 10 times every 1-2 hours when you are awake. Take your time and take a few normal breaths between deep breaths. The spirometer may include an indicator to show your best effort. Use the indicator as a goal to work toward during each repetition. After each set of 10 deep breaths, practice coughing to be sure your lungs are clear. If you have an incision (the cut made at the time of surgery), support your incision when coughing by placing a pillow or rolled up towels firmly against it. Once you are able to get out of bed, walk around indoors and cough well. You may stop using the incentive spirometer when instructed by your caregiver.  RISKS AND COMPLICATIONS Take your time so you do not get dizzy or light-headed. If you are in pain, you may need to take or ask for pain medication before doing incentive spirometry. It is harder to take a deep breath if you are having pain. AFTER USE Rest and breathe slowly and easily. It can be helpful to keep track of a log of your progress. Your caregiver can provide you with a simple table to help with this. If you are using the spirometer at home, follow these instructions: SEEK MEDICAL CARE IF:  You are having difficultly using the spirometer. You have trouble using the spirometer as often as instructed. Your pain medication is not giving enough relief while using the spirometer. You develop fever of 100.5 F (38.1 C) or higher.                                                                                                    SEEK IMMEDIATE MEDICAL CARE IF:  You cough up bloody sputum that had not been present before. You develop fever of 102 F (38.9 C) or greater. You develop worsening pain at or near the incision site. MAKE SURE YOU:  Understand these instructions. Will watch your  condition. Will get help right away if you are not doing well or get worse. Document Released: 03/17/2007 Document Revised: 01/27/2012 Document Reviewed: 05/18/2007 [facility] Patient Information 2014 [place], [place].        If you would like to see a video about joint replacement:   [email or web address]

## 2024-04-13 ENCOUNTER — Encounter (HOSPITAL_COMMUNITY): Payer: Self-pay

## 2024-04-13 ENCOUNTER — Other Ambulatory Visit: Payer: Self-pay

## 2024-04-13 ENCOUNTER — Encounter (HOSPITAL_COMMUNITY)
Admission: RE | Admit: 2024-04-13 | Discharge: 2024-04-13 | Disposition: A | Discharge status: Home / Self Care | Source: Ambulatory Visit | Attending: Orthopedic Surgery | Admitting: Orthopedic Surgery

## 2024-04-13 VITALS — BP 132/98 | HR 78 | Temp 97.9°F | Resp 16 | Ht 74.0 in | Wt 261.0 lb

## 2024-04-13 DIAGNOSIS — R7303 Prediabetes: Secondary | ICD-10-CM | POA: Diagnosis not present

## 2024-04-13 DIAGNOSIS — I080 Rheumatic disorders of both mitral and aortic valves: Secondary | ICD-10-CM | POA: Diagnosis not present

## 2024-04-13 DIAGNOSIS — G4733 Obstructive sleep apnea (adult) (pediatric): Secondary | ICD-10-CM | POA: Insufficient documentation

## 2024-04-13 DIAGNOSIS — I129 Hypertensive chronic kidney disease with stage 1 through stage 4 chronic kidney disease, or unspecified chronic kidney disease: Secondary | ICD-10-CM | POA: Diagnosis not present

## 2024-04-13 DIAGNOSIS — R748 Abnormal levels of other serum enzymes: Secondary | ICD-10-CM

## 2024-04-13 DIAGNOSIS — I2510 Atherosclerotic heart disease of native coronary artery without angina pectoris: Secondary | ICD-10-CM | POA: Insufficient documentation

## 2024-04-13 DIAGNOSIS — M1712 Unilateral primary osteoarthritis, left knee: Secondary | ICD-10-CM | POA: Insufficient documentation

## 2024-04-13 DIAGNOSIS — N189 Chronic kidney disease, unspecified: Secondary | ICD-10-CM | POA: Diagnosis not present

## 2024-04-13 DIAGNOSIS — Z01818 Encounter for other preprocedural examination: Secondary | ICD-10-CM | POA: Diagnosis not present

## 2024-04-13 DIAGNOSIS — I10 Essential (primary) hypertension: Secondary | ICD-10-CM

## 2024-04-13 HISTORY — DX: Prediabetes: R73.03

## 2024-04-13 HISTORY — DX: Nonrheumatic aortic (valve) insufficiency: I35.1

## 2024-04-13 HISTORY — DX: Unspecified osteoarthritis, unspecified site: M19.90

## 2024-04-13 HISTORY — DX: Atherosclerotic heart disease of native coronary artery without angina pectoris: I25.10

## 2024-04-13 HISTORY — DX: Abnormal levels of other serum enzymes: R74.8

## 2024-04-13 HISTORY — DX: Splenomegaly, not elsewhere classified: R16.1

## 2024-04-13 LAB — CBC
HCT: 46.8 % (ref 39.0–52.0)
Hemoglobin: 15.8 g/dL (ref 13.0–17.0)
MCH: 31.7 pg (ref 26.0–34.0)
MCHC: 33.8 g/dL (ref 30.0–36.0)
MCV: 94 fL (ref 80.0–100.0)
Platelets: 202 10*3/uL (ref 150–400)
RBC: 4.98 MIL/uL (ref 4.22–5.81)
RDW: 13 % (ref 11.5–15.5)
WBC: 7 10*3/uL (ref 4.0–10.5)
nRBC: 0 % (ref 0.0–0.2)

## 2024-04-13 LAB — COMPREHENSIVE METABOLIC PANEL WITH GFR
ALT: 66 U/L — ABNORMAL HIGH (ref 0–44)
AST: 45 U/L — ABNORMAL HIGH (ref 15–41)
Albumin: 4.1 g/dL (ref 3.5–5.0)
Alkaline Phosphatase: 62 U/L (ref 38–126)
Anion gap: 9 (ref 5–15)
BUN: 22 mg/dL (ref 8–23)
CO2: 24 mmol/L (ref 22–32)
Calcium: 9.5 mg/dL (ref 8.9–10.3)
Chloride: 101 mmol/L (ref 98–111)
Creatinine, Ser: 1.47 mg/dL — ABNORMAL HIGH (ref 0.61–1.24)
GFR, Estimated: 52 mL/min — ABNORMAL LOW (ref >60)
Glucose, Bld: 161 mg/dL — ABNORMAL HIGH (ref 70–99)
Potassium: 3.4 mmol/L — ABNORMAL LOW (ref 3.5–5.1)
Sodium: 134 mmol/L — ABNORMAL LOW (ref 135–145)
Total Bilirubin: 0.8 mg/dL (ref 0.0–1.2)
Total Protein: 7.6 g/dL (ref 6.5–8.1)

## 2024-04-13 LAB — SURGICAL PCR SCREEN
MRSA, PCR: NEGATIVE
Staphylococcus aureus: NEGATIVE

## 2024-04-13 NOTE — Telephone Encounter (Signed)
 Received new preop clearance from [facility] with the following updates:  New Procedure date: 04/22/24  Surgeon : Dr Claiborne Crew  Will contact APP and see if type of preop appt is still the same for the patient.

## 2024-04-13 NOTE — Telephone Encounter (Addendum)
 Per Charles Connor, NP patient still needs IN OFFICE appt for Preop clearance.  S/W pt and scheduled IN OFFICE appt for Preop clearance 04/14/24 with Dr. Renna Cary Approved by Dr. Renna Cary

## 2024-04-14 ENCOUNTER — Ambulatory Visit: Attending: Cardiology | Admitting: Cardiology

## 2024-04-14 ENCOUNTER — Encounter: Payer: Self-pay | Admitting: Cardiology

## 2024-04-14 ENCOUNTER — Encounter (HOSPITAL_COMMUNITY): Payer: Self-pay

## 2024-04-14 VITALS — BP 154/95 | HR 76 | Ht 74.0 in | Wt 263.8 lb

## 2024-04-14 DIAGNOSIS — I77810 Thoracic aortic ectasia: Secondary | ICD-10-CM

## 2024-04-14 DIAGNOSIS — E785 Hyperlipidemia, unspecified: Secondary | ICD-10-CM | POA: Diagnosis not present

## 2024-04-14 DIAGNOSIS — I351 Nonrheumatic aortic (valve) insufficiency: Secondary | ICD-10-CM

## 2024-04-14 DIAGNOSIS — Z01810 Encounter for preprocedural cardiovascular examination: Secondary | ICD-10-CM

## 2024-04-14 DIAGNOSIS — I2510 Atherosclerotic heart disease of native coronary artery without angina pectoris: Secondary | ICD-10-CM

## 2024-04-14 DIAGNOSIS — K080 Exfoliation of teeth due to systemic causes: Secondary | ICD-10-CM | POA: Diagnosis not present

## 2024-04-14 NOTE — Progress Notes (Signed)
 Case: [id number] Date/Time: 04/22/24 1100   Procedure: ARTHROPLASTY, KNEE, TOTAL (Left: Knee)   Anesthesia type: Spinal   Diagnosis: Primary osteoarthritis of left knee [M17.12]   Pre-op diagnosis: Left knee osteoarthritis   [facility] ROOM 10 / [facility]   Surgeons: Claiborne Crew, MD       DISCUSSION: Harvest [name] is a 68 yo male who presents to PAT prior to surgery above. PMH of HTN, CAD, AI, aortic root dilation (46mm), OSA (on CPAP), prediabetes, CKD, arthritis.  Patient follows with Cardiology for CAD. He was having progressive fatigue and shortness of breath and underwent coronary CTA in 2023 which showed high CAC score greater than 4000 with multivessel coronary calcification and flow-limiting stenosis of the R-PLB branch. Since he was not having angina he was advised to continue medical therapy. He was last seen by Dr. Renna Cary on 04/14/24 for pre op clearance. Noted to be stable and cleared for surgery. Advised f/u in 1 year:  "Proceed with left total knee arthroplasty as planned. Dr. Bernard Brick with low to moderate risk (known stable CAD)."  VS: BP (!) 132/98 Comment: right arm sitting  Pulse 78   Temp 36.6 C (Oral)   Resp 16   Ht 6\' 2"  (1.88 m)   Wt 118.4 kg   SpO2 98%   BMI 33.51 kg/m   PROVIDERS: Kathyleen Parkins, MD Cardiologist - K. [place] Hilty, MD  LABS: Labs reviewed: Acceptable for surgery. (all labs ordered are listed, but only abnormal results are displayed)  Labs Reviewed  COMPREHENSIVE METABOLIC PANEL WITH GFR - Abnormal; Notable for the following components:      Result Value   Sodium 134 (*)    Potassium 3.4 (*)    Glucose, Bld 161 (*)    Creatinine, Ser 1.47 (*)    AST 45 (*)    ALT 66 (*)    GFR, Estimated 52 (*)    All other components within normal limits  SURGICAL PCR SCREEN  CBC     IMAGES:   EKG 04/13/24 Sinus rhythm with 1st degree A-V block, rate 72 Right bundle branch block  CV:  Echo 10/22/23:  IMPRESSIONS    1. Left ventricular  ejection fraction, by estimation, is 60 to 65%. The left ventricle has normal function. The left ventricle has no regional wall motion abnormalities. There is mild concentric left ventricular hypertrophy. Left ventricular diastolic parameters are consistent with Grade I diastolic dysfunction (impaired relaxation). The average left ventricular global longitudinal strain is -17.4 %. The global longitudinal strain is normal.  2. Right ventricular systolic function is normal. The right ventricular size is normal. Tricuspid regurgitation signal is inadequate for assessing PA pressure.  3. The mitral valve is grossly normal. Trivial mitral valve regurgitation.  4. The aortic valve is tricuspid. Aortic valve regurgitation is mild. Aortic regurgitation PHT measures 703 msec. Aortic valve mean gradient measures 3.0 mmHg.  5. Aortic dilatation noted. There is moderate dilatation of the aortic root, measuring 46 mm.  6. The inferior vena cava is normal in size with greater than 50% respiratory variability, suggesting right atrial pressure of 3 mmHg.  Comparison(s): Prior images reviewed side by side. LVEF normal range at 60-65%. Mild aortic regurgitation. Aortic root 46 mm, increased from prior study. Consider CTA for further assessment if clinically indicated.  Coronary CTA 10/22/23:  IMPRESSION: 1. Coronary artery calcium  score 4017 Agatston units. This places the patient in the 99th percentile for age and gender, suggesting high risk for future cardiac events.  2. Severe stenosis proximal PLV branch of the RCA with noncalcified plaque. This stenosis was hemodynamically significant by FFR.   3. Extensive calcified plaque in the proximal LAD and proximal LCx. Stenosis appeared moderate visually in the proximal LAD and LCx but was not hemodynamically significant by FFR.  FFR 10/18/2022:  IMPRESSION: 1. Hemodynamically significant stenosis in the proximal PLV branch of the RCA.  Past  Medical History:  Diagnosis Date   Aortic valve insufficiency    Arthritis    Coronary artery disease    Elevated liver enzymes    History of kidney stones    Hypertension    Hypertriglyceridemia    OSA on CPAP    POST  SLEEP APNEA SURGERY --- PER SLEEP STUDY 06-26-15-2006  MODERATE OSA   USES CPAP DAILY   Pre-diabetes    Renal disorder    Scrotal swelling    Shortness of breath dyspnea    with exertion   Splenomegaly    Urgency of urination    OCCASIONAL    Past Surgical History:  Procedure Laterality Date   CHOLECYSTECTOMY N/A 08/07/2018   Procedure: LAPAROSCOPIC CHOLECYSTECTOMY;  Surgeon: Alanda Allegra, MD;  Location: [facility];  Service: General;  Laterality: N/A;   COLONOSCOPY     COLONOSCOPY N/A 06/05/2018   Procedure: COLONOSCOPY;  Surgeon: Suzette Espy, MD;  [facility];  Service: Endoscopy;  Laterality: N/A;  7:30   DISTAL BICEPS TENDON REPAIR Right 01/30/2015   Procedure: DISTAL BICEPS TENDON REPAIR;  Surgeon: Adah Acron, MD;  Location: [facility];  Service: Orthopedics;  Laterality: Right;   EXPLORATION LAPAROTOMY/  PARTIAL COLECTOMY WITH RESECTION TERMINAL ILEUM (APPENDICEAL MASS)  01/16/2010   BENIGN NEOPLASM APPENDIX   EXTRACORPOREAL SHOCK WAVE LITHOTRIPSY Right 12/17/2022   Procedure: EXTRACORPOREAL SHOCK WAVE LITHOTRIPSY (ESWL);  Surgeon: Marco Severs, MD;  Location: [facility];  Service: Urology;  Laterality: Right;   HYDROCELE EXCISION Right 03/18/2014   Procedure:  RIGHT SPERMACELECTOMY;  Surgeon: Alanson Alliance, MD;  Location: [facility];  Service: Urology;  Laterality: Right;   KNEE ARTHROSCOPY Right 11/18/2008   LITHOTRIPSY     LIVER BIOPSY  08/07/2018   Procedure: LIVER BIOPSY;  Surgeon: Alanda Allegra, MD;  Location: [facility];  Service: General;;   POLYPECTOMY  06/05/2018   Procedure: POLYPECTOMY;  Surgeon: Suzette Espy, MD;  [facility];  Service: Endoscopy;;  ascending and descending   TONSILLECTOMY      UVULOPALATOPHARYNGOPLASTY  2000 (APPROX)   W/  DEVIATED SEPTUM REPAIR  AND TONSILLECTOMY    MEDICATIONS:  aspirin  EC 81 MG tablet   ciclopirox (PENLAC) 8 % solution   diclofenac (VOLTAREN) 75 MG EC tablet   fenofibrate  160 MG tablet   folic acid  (FOLVITE ) 1 MG tablet   furosemide (LASIX) 20 MG tablet   ketoconazole (NIZORAL) 2 % cream   Omega-3 Fatty Acids (FISH OIL) 1200 MG CAPS   Polyethyl Glycol-Propyl Glycol (SYSTANE) 0.4-0.3 % SOLN   rosuvastatin  (CRESTOR ) 5 MG tablet   tamsulosin  (FLOMAX ) 0.4 MG CAPS capsule   traZODone  (DESYREL ) 50 MG tablet   Vitamin D, Ergocalciferol, (DRISDOL) 1.25 MG (50000 UNIT) CAPS capsule   No current facility-administered medications for this encounter.   Antoinette Kirschner [facility] Surgical Short Stay/Anesthesiology [facility] Phone [phone] 04/14/2024 1:22 PM

## 2024-04-14 NOTE — Anesthesia Preprocedure Evaluation (Addendum)
 Anesthesia Evaluation  Patient identified by MRN, date of birth, ID band Patient awake    Reviewed: Allergy & Precautions, NPO status , Patient's Chart, lab work & pertinent test results  Airway Mallampati: II  TM Distance: >3 FB     Dental  (+) Teeth Intact, Dental Advisory Given, Caps   Pulmonary shortness of breath and with exertion, sleep apnea and Continuous Positive Airway Pressure Ventilation    Pulmonary exam normal breath sounds clear to auscultation       Cardiovascular hypertension, Pt. on medications + CAD  Normal cardiovascular exam+ Valvular Problems/Murmurs AI  Rhythm:Regular Rate:Normal  Echo 10/22/23 1. Left ventricular ejection fraction, by estimation, is 60 to 65%. The  left ventricle has normal function. The left ventricle has no regional  wall motion abnormalities. There is mild concentric left ventricular  hypertrophy. Left ventricular diastolic  parameters are consistent with Grade I diastolic dysfunction (impaired  relaxation). The average left ventricular global longitudinal strain is  -17.4 %. The global longitudinal strain is normal.   2. Right ventricular systolic function is normal. The right ventricular  size is normal. Tricuspid regurgitation signal is inadequate for assessing  PA pressure.   3. The mitral valve is grossly normal. Trivial mitral valve  regurgitation.   4. The aortic valve is tricuspid. Aortic valve regurgitation is mild.  Aortic regurgitation PHT measures 703 msec. Aortic valve mean gradient  measures 3.0 mmHg.   5. Aortic dilatation noted. There is moderate dilatation of the aortic  root, measuring 46 mm.   6. The inferior vena cava is normal in size with greater than 50%  respiratory variability, suggesting right atrial pressure of 3 mmHg.   EKG   Neuro/Psych negative neurological ROS  negative psych ROS   GI/Hepatic negative GI ROS, Neg liver ROS,,,  Endo/Other   Obesity HLD Pre diabetes  Renal/GU Renal InsufficiencyRenal diseaseHx/o renal calculi     Musculoskeletal  (+) Arthritis , Osteoarthritis,  OA Left knee   Abdominal  (+) + obese  Peds  Hematology negative hematology ROS (+)   Anesthesia Other Findings   Reproductive/Obstetrics                              Anesthesia Physical Anesthesia Plan  ASA: 2  Anesthesia Plan: Spinal   Post-op Pain Management: Dilaudid  IV, Precedex, Ofirmev  IV (intra-op)* and Regional block*   Induction: Intravenous  PONV Risk Score and Plan: 3 and Treatment may vary due to age or medical condition and Propofol  infusion  Airway Management Planned: Natural Airway and Simple Face Mask  Additional Equipment: None  Intra-op Plan:   Post-operative Plan:   Informed Consent: I have reviewed the patients History and Physical, chart, labs and discussed the procedure including the risks, benefits and alternatives for the proposed anesthesia with the patient or authorized representative who has indicated his/her understanding and acceptance.     Dental advisory given  Plan Discussed with: CRNA and Anesthesiologist  Anesthesia Plan Comments: (See PAT note from 5/27)         Anesthesia Quick Evaluation

## 2024-04-14 NOTE — Patient Instructions (Signed)
 Medication Instructions:  The current medical regimen is effective;  continue present plan and medications.  *If you need a refill on your cardiac medications before your next appointment, please call your pharmacy*  Cleared for your upcoming planned surgery.  Follow-Up: At [facility], you and your health needs are our priority.  As part of our continuing mission to provide you with exceptional heart care, our providers are all part of one team.  This team includes your primary Cardiologist (physician) and Advanced Practice Providers or APPs (Physician Assistants and Nurse Practitioners) who all work together to provide you with the care you need, when you need it.  Your next appointment:   1 year(s)  Provider:   Hazle Lites, MD    We recommend signing up for the patient portal called "MyChart".  Sign up information is provided on this After Visit Summary.  MyChart is used to connect with patients for Virtual Visits (Telemedicine).  Patients are able to view lab/test results, encounter notes, upcoming appointments, etc.  Non-urgent messages can be sent to your provider as well.   To learn more about what you can do with MyChart, go to [email or web address].

## 2024-04-14 NOTE — Progress Notes (Signed)
 Cardiology Office Note:  .   Date:  04/14/2024  ID:  TREVYON SWOR, DOB 21-Jul-1956, MRN [id number] PCP: Kathyleen Parkins, MD  [facility] Providers Cardiologist:  Hazle Lites, MD     History of Present Illness: .   NAVEEN CLARDY is a 68 y.o. male Discussed the use of AI scribe software for clinical note transcription with the patient, who gave verbal consent to proceed.  History of Present Illness ARCHIMEDES HAROLD is a 68 year old male with coronary artery disease and hyperlipidemia who presents for preoperative risk stratification for upcoming left total knee arthroplasty. He was referred by his family doctor for evaluation of low blood pressure after an episode of syncope.  He is scheduled for a left total knee arthroplasty. His knee has been aching significantly prior to the past few days, but recently it has not been bothering him as much.  He has a history of coronary artery disease, with a CT coronary performed on October 18, 2022, showing a hemodynamically significant stenosis in the proximal posterior lateral branch of the RCA and a calcium  score of 4017. He is currently managed medically with Crestor , fenofibrate , and aspirin . An echocardiogram on October 22, 2023, showed an ejection fraction of 60-65%, grade one diastolic dysfunction, mild aortic insufficiency, and a moderately dilated aortic root of 46 mm. His EKG performed yesterday showed sinus rhythm, a heart rate of 72 bpm, right bundle branch block, and first-degree AV block with a PR interval of 226 ms.  He has a history of hyperlipidemia, treated with Crestor  and fenofibrate , achieving an LDL of 55 and triglycerides of 179. His most recent LDL was 78, and triglycerides were 183. His hemoglobin A1c is 5.9, indicating prediabetes.  He has experienced episodes of hypotension, including one instance where he passed out. He was initially prescribed a blood pressure medication at 20 mg, which was reduced to 5 mg due  to persistent lightheadedness. He has not taken the medication for months as his blood pressure has been stable at home, though it was slightly elevated today at 134/94. He monitors his blood pressure at home and reports it was 139/xx yesterday.  Family history is significant for cardiac arrest in both parents; his father passed at 78 and his mother in her late 67s to early 78s. He has never smoked and wants to lose about 20 pounds, hoping that the knee surgery will aid in this by allowing him to be more active. No chest pain and shortness of breath.      ROS: No CP, No SOB  Studies Reviewed: .        Results LABS LDL: 55 (04/2023) Triglycerides: 179 (04/2023) HbA1c: 5.9 Creatinine: 1.3  RADIOLOGY CT coronary: Hemodynamically significant stenosis in proximal posterior lateral branch of RCA, calcium  score 4017 (10/18/2022)  DIAGNOSTIC Echocardiogram: Ejection fraction 60-65%, grade one diastolic dysfunction, mild aortic insufficiency, moderate dilated aortic root 46 mm (10/22/2023) EKG: Sinus rhythm, heart rate 72 bpm, right bundle branch block, first degree AV block, PR interval 226 ms (04/13/2024)  Risk Assessment/Calculations:           Physical Exam:   VS:  BP (!) 154/95   Pulse 76   Ht 6\' 2"  (1.88 m)   Wt 263 lb 12.8 oz (119.7 kg)   SpO2 93%   BMI 33.87 kg/m    Wt Readings from Last 3 Encounters:  04/14/24 263 lb 12.8 oz (119.7 kg)  04/13/24 261 lb (118.4 kg)  01/06/24 257  lb 12.8 oz (116.9 kg)    GEN: Well nourished, well developed in no acute distress NECK: No JVD; No carotid bruits CARDIAC: RRR, no murmurs, no rubs, no gallops RESPIRATORY:  Clear to auscultation without rales, wheezing or rhonchi  ABDOMEN: Soft, non-tender, non-distended EXTREMITIES:  No edema; No deformity   ASSESSMENT AND PLAN: .    Assessment and Plan Assessment & Plan Coronary artery disease Coronary artery disease with hemodynamically significant stenosis in the proximal posterior  lateral branch of the RCA. Previous coronary CT scan showed extensive calcified plaque with a calcium  score of 4017. Managed with Crestor , fenofibrate , and aspirin . Low to moderate cardiac risk for upcoming orthopedic surgery. - Proceed with left total knee arthroplasty as planned. Dr. Bernard Brick with low to moderate risk (known stable CAD). - Continue Crestor , fenofibrate , and aspirin . - No stress test required prior to surgery due to stable symptoms and recent coronary CT scan.  Hypertension Fluctuating blood pressure with episodes of hypotension and hypertension. Currently not on regular antihypertensive medication due to hypotension episodes. Blood pressure today is 134/94 mmHg, generally stable at home. History of syncope related to hypotension with adjusted medication regimen. - Have antihypertensive medication available during surgery for potential elevated blood pressure. - Monitor blood pressure postoperatively and adjust medication as needed. Olmesartan  20 mg on hand, BP at home in control.   Hyperlipidemia Hyperlipidemia managed with Crestor  and fenofibrate . Recent LDL level is 78 mg/dL and triglycerides are 564 mg/dL, indicating good control. - Continue Crestor  and fenofibrate . Goal LDL < 70  Right bundle branch block Right bundle branch block on recent EKG. No acute changes or symptoms.  First degree AV block First degree AV block with PR interval of 226 milliseconds on recent EKG. No acute changes or symptoms. No syncope  Aortic regurgitation Stable on ECHO  Aortic root dilation 46 mm last check. Continue to monitor.   Prediabetes Prediabetes with recent hemoglobin A1c of 5.9%. Diet, exercise.  Follow-up Follow-up with cardiology and primary care for cardiovascular health and blood pressure management. - Follow up with [facility] in one year for cardiovascular evaluation.         Dispo: 1 yr Dr. Maximo Spar  Signed, Dorothye Gathers, MD

## 2024-04-21 NOTE — H&P (Signed)
 TOTAL KNEE ADMISSION H&P  Patient is being admitted for left total knee arthroplasty.  Therapy Plans: outpatient therapy EO Disposition: Home with wife Planned DVT Prophylaxis: aspirin  81mg  BID DME needed: none PCP: Dr. Lewayne Records clearance received Cardiac: Dr. Maximo Spar - (family hx, hx of syncope) TXA: IV Allergies: NKDA Anesthesia Concerns: none BMI: 33.8 Last HgbA1c: Not diabetic   Other: - SDD if doing well (wife went home SD this year from her TKA with Dr. Abigail Abler) - No hx of VTE or cancer - oxy, robaxin  ,tylenol , celebrex   Subjective:  Chief Complaint:left knee pain.  HPI: [name], 68 y.o. male, has a history of pain and functional disability in the left knee due to arthritis and has failed non-surgical conservative treatments for greater than 12 weeks to includeNSAID's and/or analgesics, corticosteriod injections, and activity modification.  Onset of symptoms was gradual, starting 2 years ago with gradually worsening course since that time. The patient noted no past surgery on the left knee(s).  Patient currently rates pain in the left knee(s) at 8 out of 10 with activity. Patient has night pain, worsening of pain with activity and weight bearing, and pain with passive range of motion.  Patient has evidence of joint space narrowing by imaging studies. There is no active infection.  Patient Active Problem List   Diagnosis Date Noted   Fatty liver 01/06/2024   H/O adenomatous polyp of colon 01/06/2024   Splenomegaly 12/24/2023   Liver dysfunction 12/24/2023   Rotavirus enteritis 05/19/2022   AKI (acute kidney injury) (HCC) 05/17/2022   Diarrhea 05/17/2022   Hypertriglyceridemia 05/17/2022   Class 1 obesity 05/17/2022   Essential hypertension 05/17/2022   Calculus of gallbladder without cholecystitis without obstruction    Elevated liver enzymes    Rupture of distal biceps tendon 01/30/2015   Hydrocele, right 03/18/2014   Past Medical History:  Diagnosis Date    Aortic valve insufficiency    Arthritis    Coronary artery disease    Elevated liver enzymes    History of kidney stones    Hypertension    Hypertriglyceridemia    OSA on CPAP    POST  SLEEP APNEA SURGERY --- PER SLEEP STUDY 06-26-15-2006  MODERATE OSA   USES CPAP DAILY   Pre-diabetes    Renal disorder    Scrotal swelling    Shortness of breath dyspnea    with exertion   Splenomegaly    Urgency of urination    OCCASIONAL    Past Surgical History:  Procedure Laterality Date   CHOLECYSTECTOMY N/A 08/07/2018   Procedure: LAPAROSCOPIC CHOLECYSTECTOMY;  Surgeon: Alanda Allegra, MD;  Location: [facility];  Service: General;  Laterality: N/A;   COLONOSCOPY     COLONOSCOPY N/A 06/05/2018   Procedure: COLONOSCOPY;  Surgeon: Suzette Espy, MD;  Location: [facility];  Service: Endoscopy;  Laterality: N/A;  7:30   DISTAL BICEPS TENDON REPAIR Right 01/30/2015   Procedure: DISTAL BICEPS TENDON REPAIR;  Surgeon: Adah Acron, MD;  Location: [facility];  Service: Orthopedics;  Laterality: Right;   EXPLORATION LAPAROTOMY/  PARTIAL COLECTOMY WITH RESECTION TERMINAL ILEUM (APPENDICEAL MASS)  01/16/2010   BENIGN NEOPLASM APPENDIX   EXTRACORPOREAL SHOCK WAVE LITHOTRIPSY Right 12/17/2022   Procedure: EXTRACORPOREAL SHOCK WAVE LITHOTRIPSY (ESWL);  Surgeon: Marco Severs, MD;  Location: [facility];  Service: Urology;  Laterality: Right;   HYDROCELE EXCISION Right 03/18/2014   Procedure:  RIGHT SPERMACELECTOMY;  Surgeon: Alanson Alliance, MD;  Location: [facility];  Service: Urology;  Laterality: Right;   KNEE ARTHROSCOPY Right 11/18/2008   LITHOTRIPSY     LIVER BIOPSY  08/07/2018   Procedure: LIVER BIOPSY;  Surgeon: Alanda Allegra, MD;  Location: [facility];  Service: General;;   POLYPECTOMY  06/05/2018   Procedure: POLYPECTOMY;  Surgeon: Suzette Espy, MD;  Location: [facility];  Service: Endoscopy;;  ascending and descending   TONSILLECTOMY     UVULOPALATOPHARYNGOPLASTY  2000 (APPROX)    W/  DEVIATED SEPTUM REPAIR  AND TONSILLECTOMY    No current facility-administered medications for this encounter.   Current Outpatient Medications  Medication Sig Dispense Refill Last Dose/Taking   aspirin  EC 81 MG tablet Take 1 tablet (81 mg total) by mouth daily. Swallow whole. 90 tablet 3 Taking   ciclopirox (PENLAC) 8 % solution Apply 1 Application topically once a week.   Taking   diclofenac (VOLTAREN) 75 MG EC tablet Take 75 mg by mouth 2 (two) times daily.   Taking   fenofibrate  160 MG tablet Take 160 mg by mouth daily.    Taking   folic acid  (FOLVITE ) 1 MG tablet Take 1 mg by mouth in the morning.   Taking   furosemide (LASIX) 20 MG tablet Take 20 mg by mouth in the morning.   Taking   Omega-3 Fatty Acids (FISH OIL) 1200 MG CAPS Take 1,200 mg by mouth in the morning.   Taking   Polyethyl Glycol-Propyl Glycol (SYSTANE) 0.4-0.3 % SOLN Place 1 drop into both eyes in the morning.   Taking   rosuvastatin  (CRESTOR ) 5 MG tablet TAKE 1 TABLET(5 MG) BY MOUTH DAILY 90 tablet 1 Taking   tamsulosin  (FLOMAX ) 0.4 MG CAPS capsule Take 1 capsule (0.4 mg total) by mouth daily. 90 capsule 3 Taking   traZODone  (DESYREL ) 50 MG tablet Take 50 mg by mouth at bedtime.   Taking   Vitamin D, Ergocalciferol, (DRISDOL) 1.25 MG (50000 UNIT) CAPS capsule Take 50,000 Units by mouth every Saturday.   Taking   ketoconazole (NIZORAL) 2 % cream daily.      No Known Allergies  Social History   Tobacco Use   Smoking status: Never   Smokeless tobacco: Never  Substance Use Topics   Alcohol use: Not Currently    Comment: RARE    Family History  Problem Relation Age of Onset   Heart attack Mother    Heart attack Father    Heart attack Maternal Uncle    Colon cancer Neg Hx    Cirrhosis Neg Hx      Review of Systems  Constitutional:  Negative for chills and fever.  Respiratory:  Negative for cough and shortness of breath.   Cardiovascular:  Negative for chest pain.  Gastrointestinal:  Negative for nausea  and vomiting.  Musculoskeletal:  Positive for arthralgias.     Objective:  Physical Exam Well nourished and well developed. General: Alert and oriented x3, cooperative and pleasant, no acute distress.  Musculoskeletal: Bilateral knee exams: No palpable effusions, warmth erythema Bilateral genu varum associate with slight flexion contractures with flexion of 110 degrees with tightness over the anterior medial knees Stable medial and lateral collateral ligaments Tenderness medially Neurovascular intact distally without lower extremity edema or erythema  Vital signs in last 24 hours:    Labs:   Estimated body mass index is 33.87 kg/m as calculated from the following:   Height as of 04/14/24: 6\' 2"  (1.88 m).   Weight as of 04/14/24: 119.7 kg.   Imaging Review Plain  radiographs demonstrate severe degenerative joint disease of the left knee(s). The overall alignment isneutral. The bone quality appears to be adequate for age and reported activity level.      Assessment/Plan:  End stage arthritis, left knee   The patient history, physical examination, clinical judgment of the provider and imaging studies are consistent with end stage degenerative joint disease of the left knee(s) and total knee arthroplasty is deemed medically necessary. The treatment options including medical management, injection therapy arthroscopy and arthroplasty were discussed at length. The risks and benefits of total knee arthroplasty were presented and reviewed. The risks due to aseptic loosening, infection, stiffness, patella tracking problems, thromboembolic complications and other imponderables were discussed. The patient acknowledged the explanation, agreed to proceed with the plan and consent was signed. Patient is being admitted for inpatient treatment for surgery, pain control, PT, OT, prophylactic antibiotics, VTE prophylaxis, progressive ambulation and ADL's and discharge planning. The patient is  planning to be discharged home.     Patient's anticipated LOS is less than 2 midnights, meeting these requirements: - Younger than 46 - Lives within 1 hour of care - Has a competent adult at home to recover with post-op recover - NO history of  - Chronic pain requiring opiods  - Diabetes  - Coronary Artery Disease  - Heart failure  - Heart attack  - Stroke  - DVT/VTE  - Cardiac arrhythmia  - Respiratory Failure/COPD  - Renal failure  - Anemia  - Advanced Liver disease  Kim Pen, PA-C Orthopedic Surgery [facility] [place] [phone]

## 2024-04-22 ENCOUNTER — Other Ambulatory Visit: Payer: Self-pay

## 2024-04-22 ENCOUNTER — Ambulatory Visit (HOSPITAL_COMMUNITY): Admitting: Anesthesiology

## 2024-04-22 ENCOUNTER — Observation Stay (HOSPITAL_COMMUNITY)
Admission: RE | Admit: 2024-04-22 | Discharge: 2024-04-23 | Disposition: A | Discharge status: Home / Self Care | Source: Ambulatory Visit | Attending: Orthopedic Surgery | Admitting: Orthopedic Surgery

## 2024-04-22 ENCOUNTER — Encounter (HOSPITAL_COMMUNITY): Payer: Self-pay | Admitting: Orthopedic Surgery

## 2024-04-22 ENCOUNTER — Ambulatory Visit (HOSPITAL_COMMUNITY): Payer: Self-pay | Admitting: Medical

## 2024-04-22 ENCOUNTER — Encounter (HOSPITAL_COMMUNITY)
Admission: RE | Disposition: A | Discharge status: Home / Self Care | Payer: Self-pay | Source: Ambulatory Visit | Attending: Orthopedic Surgery

## 2024-04-22 DIAGNOSIS — I2510 Atherosclerotic heart disease of native coronary artery without angina pectoris: Secondary | ICD-10-CM | POA: Diagnosis not present

## 2024-04-22 DIAGNOSIS — Z96652 Presence of left artificial knee joint: Principal | ICD-10-CM

## 2024-04-22 DIAGNOSIS — M1712 Unilateral primary osteoarthritis, left knee: Principal | ICD-10-CM | POA: Insufficient documentation

## 2024-04-22 DIAGNOSIS — Z79899 Other long term (current) drug therapy: Secondary | ICD-10-CM | POA: Insufficient documentation

## 2024-04-22 DIAGNOSIS — Z7982 Long term (current) use of aspirin: Secondary | ICD-10-CM | POA: Insufficient documentation

## 2024-04-22 DIAGNOSIS — I10 Essential (primary) hypertension: Secondary | ICD-10-CM | POA: Diagnosis not present

## 2024-04-22 DIAGNOSIS — G8918 Other acute postprocedural pain: Secondary | ICD-10-CM | POA: Diagnosis not present

## 2024-04-22 HISTORY — PX: TOTAL KNEE ARTHROPLASTY: SHX125

## 2024-04-22 SURGERY — ARTHROPLASTY, KNEE, TOTAL
Anesthesia: Spinal | Site: Knee | Laterality: Left

## 2024-04-22 MED ORDER — OXYCODONE HCL 5 MG/5ML PO SOLN: 5.0000 mg | Freq: Once | ORAL | Status: DC | PRN

## 2024-04-22 MED ORDER — DEXAMETHASONE SODIUM PHOSPHATE 10 MG/ML IJ SOLN
8.0000 mg | Freq: Once | INTRAMUSCULAR | Status: AC
  Administered 2024-04-22: 8 mg via INTRAVENOUS

## 2024-04-22 MED ORDER — POLYETHYLENE GLYCOL 3350 17 G PO PACK
17.0000 g | PACK | Freq: Two times a day (BID) | ORAL | Status: DC
  Administered 2024-04-22 – 2024-04-23 (×2): 17 g via ORAL
  Filled 2024-04-22 (×3): qty 1

## 2024-04-22 MED ORDER — HYDROMORPHONE HCL 1 MG/ML IJ SOLN: 0.2500 mg | INTRAMUSCULAR | Status: DC | PRN

## 2024-04-22 MED ORDER — ONDANSETRON HCL 4 MG/2ML IJ SOLN
INTRAMUSCULAR | Status: DC | PRN
  Administered 2024-04-22: 4 mg via INTRAVENOUS

## 2024-04-22 MED ORDER — OXYCODONE HCL 5 MG PO TABS: 5.0000 mg | ORAL_TABLET | Freq: Once | ORAL | Status: DC | PRN

## 2024-04-22 MED ORDER — PHENYLEPHRINE 80 MCG/ML (10ML) SYRINGE FOR IV PUSH (FOR BLOOD PRESSURE SUPPORT)
PREFILLED_SYRINGE | INTRAVENOUS | Status: AC
  Filled 2024-04-22: qty 10

## 2024-04-22 MED ORDER — BISACODYL 10 MG RE SUPP: 10.0000 mg | Freq: Every day | RECTAL | Status: DC | PRN

## 2024-04-22 MED ORDER — METHOCARBAMOL 1000 MG/10ML IJ SOLN: 500.0000 mg | Freq: Four times a day (QID) | INTRAMUSCULAR | Status: DC | PRN

## 2024-04-22 MED ORDER — TRAZODONE HCL 50 MG PO TABS
50.0000 mg | ORAL_TABLET | Freq: Every day | ORAL | Status: DC
  Administered 2024-04-22: 50 mg via ORAL
  Filled 2024-04-22: qty 1

## 2024-04-22 MED ORDER — KETOROLAC TROMETHAMINE 30 MG/ML IJ SOLN
INTRAMUSCULAR | Status: AC
  Filled 2024-04-22: qty 1

## 2024-04-22 MED ORDER — CHLORHEXIDINE GLUCONATE 0.12 % MT SOLN
15.0000 mL | Freq: Once | OROMUCOSAL | Status: AC
  Administered 2024-04-22: 15 mL via OROMUCOSAL

## 2024-04-22 MED ORDER — TRANEXAMIC ACID-NACL 1000-0.7 MG/100ML-% IV SOLN
1000.0000 mg | Freq: Once | INTRAVENOUS | Status: AC
  Administered 2024-04-22: 1000 mg via INTRAVENOUS
  Filled 2024-04-22: qty 100

## 2024-04-22 MED ORDER — 0.9 % SODIUM CHLORIDE (POUR BTL) OPTIME
TOPICAL | Status: DC | PRN
  Administered 2024-04-22: 1000 mL

## 2024-04-22 MED ORDER — CEFAZOLIN SODIUM-DEXTROSE 2-4 GM/100ML-% IV SOLN
2.0000 g | Freq: Four times a day (QID) | INTRAVENOUS | Status: AC
  Administered 2024-04-22 – 2024-04-23 (×2): 2 g via INTRAVENOUS
  Filled 2024-04-22 (×2): qty 100

## 2024-04-22 MED ORDER — SENNA 8.6 MG PO TABS
2.0000 | ORAL_TABLET | Freq: Every day | ORAL | Status: DC
  Administered 2024-04-22: 17.2 mg via ORAL
  Filled 2024-04-22: qty 2

## 2024-04-22 MED ORDER — FENOFIBRATE 160 MG PO TABS
160.0000 mg | ORAL_TABLET | Freq: Every day | ORAL | Status: DC
  Administered 2024-04-22 – 2024-04-23 (×2): 160 mg via ORAL
  Filled 2024-04-22 (×2): qty 1

## 2024-04-22 MED ORDER — BUPIVACAINE-EPINEPHRINE (PF) 0.25% -1:200000 IJ SOLN
INTRAMUSCULAR | Status: AC
  Filled 2024-04-22: qty 30

## 2024-04-22 MED ORDER — TRANEXAMIC ACID-NACL 1000-0.7 MG/100ML-% IV SOLN
1000.0000 mg | INTRAVENOUS | Status: AC
  Administered 2024-04-22: 1000 mg via INTRAVENOUS
  Filled 2024-04-22: qty 100

## 2024-04-22 MED ORDER — SODIUM CHLORIDE 0.9 % IR SOLN
Status: DC | PRN
  Administered 2024-04-22: 1000 mL

## 2024-04-22 MED ORDER — OXYCODONE HCL 5 MG PO TABS
5.0000 mg | ORAL_TABLET | ORAL | Status: DC | PRN
  Administered 2024-04-22: 5 mg via ORAL
  Administered 2024-04-22 – 2024-04-23 (×2): 10 mg via ORAL
  Filled 2024-04-22 (×2): qty 2

## 2024-04-22 MED ORDER — MENTHOL 3 MG MT LOZG: 1.0000 | LOZENGE | OROMUCOSAL | Status: DC | PRN

## 2024-04-22 MED ORDER — ONDANSETRON HCL 4 MG/2ML IJ SOLN: 4.0000 mg | Freq: Once | INTRAMUSCULAR | Status: DC | PRN

## 2024-04-22 MED ORDER — PHENOL 1.4 % MT LIQD: 1.0000 | OROMUCOSAL | Status: DC | PRN

## 2024-04-22 MED ORDER — ASPIRIN 81 MG PO CHEW
81.0000 mg | CHEWABLE_TABLET | Freq: Two times a day (BID) | ORAL | Status: DC
  Administered 2024-04-22 – 2024-04-23 (×2): 81 mg via ORAL
  Filled 2024-04-22 (×2): qty 1

## 2024-04-22 MED ORDER — PROPOFOL 10 MG/ML IV BOLUS
INTRAVENOUS | Status: DC | PRN
  Administered 2024-04-22: 20 mg via INTRAVENOUS
  Administered 2024-04-22: 60 mg via INTRAVENOUS

## 2024-04-22 MED ORDER — LACTATED RINGERS IV SOLN: INTRAVENOUS | Status: DC

## 2024-04-22 MED ORDER — PROPOFOL 500 MG/50ML IV EMUL
INTRAVENOUS | Status: DC | PRN
  Administered 2024-04-22 (×2): 150 ug/kg/min via INTRAVENOUS

## 2024-04-22 MED ORDER — SODIUM CHLORIDE 0.9% FLUSH: 3.0000 mL | INTRAVENOUS | Status: DC | PRN

## 2024-04-22 MED ORDER — STERILE WATER FOR IRRIGATION IR SOLN
Status: DC | PRN
  Administered 2024-04-22: 2000 mL

## 2024-04-22 MED ORDER — POVIDONE-IODINE 10 % EX SWAB: 2.0000 | Freq: Once | CUTANEOUS | Status: DC

## 2024-04-22 MED ORDER — FENTANYL CITRATE (PF) 100 MCG/2ML IJ SOLN
INTRAMUSCULAR | Status: AC
  Filled 2024-04-22: qty 2

## 2024-04-22 MED ORDER — PHENYLEPHRINE 80 MCG/ML (10ML) SYRINGE FOR IV PUSH (FOR BLOOD PRESSURE SUPPORT)
PREFILLED_SYRINGE | INTRAVENOUS | Status: DC | PRN
  Administered 2024-04-22: 80 ug via INTRAVENOUS

## 2024-04-22 MED ORDER — METHOCARBAMOL 500 MG PO TABS
500.0000 mg | ORAL_TABLET | Freq: Four times a day (QID) | ORAL | Status: DC | PRN
  Administered 2024-04-22: 500 mg via ORAL
  Filled 2024-04-22: qty 1

## 2024-04-22 MED ORDER — ROPIVACAINE HCL 5 MG/ML IJ SOLN
INTRAMUSCULAR | Status: DC | PRN
  Administered 2024-04-22: 30 mL via PERINEURAL

## 2024-04-22 MED ORDER — DROPERIDOL 2.5 MG/ML IJ SOLN: 0.6250 mg | Freq: Once | INTRAMUSCULAR | Status: DC | PRN

## 2024-04-22 MED ORDER — SODIUM CHLORIDE (PF) 0.9 % IJ SOLN
INTRAMUSCULAR | Status: AC
  Filled 2024-04-22: qty 30

## 2024-04-22 MED ORDER — SODIUM CHLORIDE 0.9% FLUSH
3.0000 mL | Freq: Two times a day (BID) | INTRAVENOUS | Status: DC
  Administered 2024-04-23: 10 mL via INTRAVENOUS

## 2024-04-22 MED ORDER — MIDAZOLAM HCL 2 MG/2ML IJ SOLN
0.5000 mg | Freq: Once | INTRAMUSCULAR | Status: AC
  Administered 2024-04-22: 1 mg via INTRAVENOUS
  Filled 2024-04-22: qty 2

## 2024-04-22 MED ORDER — ONDANSETRON HCL 4 MG PO TABS: 4.0000 mg | ORAL_TABLET | Freq: Four times a day (QID) | ORAL | Status: DC | PRN

## 2024-04-22 MED ORDER — OXYCODONE HCL 5 MG PO TABS
10.0000 mg | ORAL_TABLET | ORAL | Status: DC | PRN
  Filled 2024-04-22: qty 2

## 2024-04-22 MED ORDER — TAMSULOSIN HCL 0.4 MG PO CAPS
0.4000 mg | ORAL_CAPSULE | Freq: Every day | ORAL | Status: DC
  Administered 2024-04-23: 0.4 mg via ORAL
  Filled 2024-04-22 (×2): qty 1

## 2024-04-22 MED ORDER — METOCLOPRAMIDE HCL 5 MG/ML IJ SOLN: 5.0000 mg | Freq: Three times a day (TID) | INTRAMUSCULAR | Status: DC | PRN

## 2024-04-22 MED ORDER — FUROSEMIDE 20 MG PO TABS
20.0000 mg | ORAL_TABLET | Freq: Every day | ORAL | Status: DC
  Administered 2024-04-23: 20 mg via ORAL
  Filled 2024-04-22: qty 1

## 2024-04-22 MED ORDER — KETOROLAC TROMETHAMINE 30 MG/ML IJ SOLN
INTRAMUSCULAR | Status: DC | PRN
Start: 2024-04-22 — End: 2024-04-22
  Administered 2024-04-22: 30 mg

## 2024-04-22 MED ORDER — DEXAMETHASONE SODIUM PHOSPHATE 10 MG/ML IJ SOLN
10.0000 mg | Freq: Once | INTRAMUSCULAR | Status: AC
  Administered 2024-04-23: 10 mg via INTRAVENOUS
  Filled 2024-04-22: qty 1

## 2024-04-22 MED ORDER — PROPOFOL 1000 MG/100ML IV EMUL
INTRAVENOUS | Status: AC
  Filled 2024-04-22: qty 100

## 2024-04-22 MED ORDER — FENTANYL CITRATE PF 50 MCG/ML IJ SOSY
100.0000 ug | PREFILLED_SYRINGE | Freq: Once | INTRAMUSCULAR | Status: AC
  Administered 2024-04-22: 50 ug via INTRAVENOUS
  Filled 2024-04-22: qty 2

## 2024-04-22 MED ORDER — ROSUVASTATIN CALCIUM 5 MG PO TABS
5.0000 mg | ORAL_TABLET | Freq: Every day | ORAL | Status: DC
  Administered 2024-04-22 – 2024-04-23 (×2): 5 mg via ORAL
  Filled 2024-04-22 (×2): qty 1

## 2024-04-22 MED ORDER — FENTANYL CITRATE (PF) 100 MCG/2ML IJ SOLN
INTRAMUSCULAR | Status: DC | PRN
  Administered 2024-04-22 (×2): 50 ug via INTRAVENOUS

## 2024-04-22 MED ORDER — CELECOXIB 200 MG PO CAPS
200.0000 mg | ORAL_CAPSULE | Freq: Two times a day (BID) | ORAL | Status: DC
  Administered 2024-04-22: 200 mg via ORAL
  Filled 2024-04-22: qty 1

## 2024-04-22 MED ORDER — METOCLOPRAMIDE HCL 5 MG PO TABS: 5.0000 mg | ORAL_TABLET | Freq: Three times a day (TID) | ORAL | Status: DC | PRN

## 2024-04-22 MED ORDER — HYDROMORPHONE HCL 1 MG/ML IJ SOLN: 0.5000 mg | INTRAMUSCULAR | Status: DC | PRN

## 2024-04-22 MED ORDER — ONDANSETRON HCL 4 MG/2ML IJ SOLN
4.0000 mg | Freq: Four times a day (QID) | INTRAMUSCULAR | Status: DC | PRN
Start: 2024-04-22 — End: 2024-04-23

## 2024-04-22 MED ORDER — CEFAZOLIN SODIUM-DEXTROSE 2-4 GM/100ML-% IV SOLN
2.0000 g | INTRAVENOUS | Status: AC
  Administered 2024-04-22: 2 g via INTRAVENOUS
  Filled 2024-04-22: qty 100

## 2024-04-22 MED ORDER — ALUM & MAG HYDROXIDE-SIMETH 200-200-20 MG/5ML PO SUSP: 30.0000 mL | ORAL | Status: DC | PRN

## 2024-04-22 MED ORDER — ORAL CARE MOUTH RINSE: 15.0000 mL | Freq: Once | OROMUCOSAL | Status: AC

## 2024-04-22 MED ORDER — ACETAMINOPHEN 500 MG PO TABS
1000.0000 mg | ORAL_TABLET | Freq: Four times a day (QID) | ORAL | Status: DC
  Administered 2024-04-22 – 2024-04-23 (×3): 1000 mg via ORAL
  Filled 2024-04-22 (×3): qty 2

## 2024-04-22 MED ORDER — FOLIC ACID 1 MG PO TABS
1.0000 mg | ORAL_TABLET | Freq: Every day | ORAL | Status: DC
  Administered 2024-04-23: 1 mg via ORAL
  Filled 2024-04-22: qty 1

## 2024-04-22 MED ORDER — SODIUM CHLORIDE (PF) 0.9 % IJ SOLN
INTRAMUSCULAR | Status: DC | PRN
  Administered 2024-04-22: 30 mL

## 2024-04-22 MED ORDER — BUPIVACAINE-EPINEPHRINE (PF) 0.25% -1:200000 IJ SOLN
INTRAMUSCULAR | Status: DC | PRN
  Administered 2024-04-22: 30 mL

## 2024-04-22 MED ORDER — DIPHENHYDRAMINE HCL 12.5 MG/5ML PO ELIX: 12.5000 mg | ORAL_SOLUTION | ORAL | Status: DC | PRN

## 2024-04-22 SURGICAL SUPPLY — 45 items
ATTUNE MED ANAT PAT 38 KNEE (Knees) IMPLANT
BAG COUNTER SPONGE SURGICOUNT (BAG) IMPLANT
BAG ZIPLOCK 12X15 (MISCELLANEOUS) ×1 IMPLANT
BASEPLATE TIB CMT FB PCKT SZ 8 (Knees) IMPLANT
BLADE SAW SGTL 13.0X1.19X90.0M (BLADE) ×1 IMPLANT
BNDG COHESIVE 4X5 WHT NS (GAUZE/BANDAGES/DRESSINGS) IMPLANT
BNDG ELASTIC 6INX 5YD STR LF (GAUZE/BANDAGES/DRESSINGS) ×1 IMPLANT
BOWL SMART MIX CTS (DISPOSABLE) ×1 IMPLANT
CEMENT HV SMART SET (Cement) ×2 IMPLANT
COMPONENT FEM CMT ATTUN LT SZ6 (Femur) IMPLANT
COVER SURGICAL LIGHT HANDLE (MISCELLANEOUS) ×1 IMPLANT
CUFF TRNQT CYL 34X4.125X (TOURNIQUET CUFF) ×1 IMPLANT
DERMABOND ADVANCED .7 DNX12 (GAUZE/BANDAGES/DRESSINGS) ×1 IMPLANT
DRAPE U-SHAPE 47X51 STRL (DRAPES) ×1 IMPLANT
DRESSING AQUACEL AG SP 3.5X10 (GAUZE/BANDAGES/DRESSINGS) ×1 IMPLANT
DURAPREP 26ML APPLICATOR (WOUND CARE) ×2 IMPLANT
ELECT REM PT RETURN 15FT ADLT (MISCELLANEOUS) ×1 IMPLANT
GLOVE BIO SURGEON STRL SZ 6 (GLOVE) ×1 IMPLANT
GLOVE BIOGEL PI IND STRL 6.5 (GLOVE) ×1 IMPLANT
GLOVE BIOGEL PI IND STRL 7.5 (GLOVE) ×1 IMPLANT
GLOVE ORTHO TXT STRL SZ7.5 (GLOVE) ×2 IMPLANT
GOWN STRL REUS W/ TWL LRG LVL3 (GOWN DISPOSABLE) ×2 IMPLANT
HOLDER FOLEY CATH W/STRAP (MISCELLANEOUS) IMPLANT
INSERT MED CMT ATTUNE 6 7 LT (Insert) IMPLANT
KIT TURNOVER KIT A (KITS) ×1 IMPLANT
MANIFOLD NEPTUNE II (INSTRUMENTS) ×1 IMPLANT
NDL SAFETY ECLIPSE 18X1.5 (NEEDLE) IMPLANT
NS IRRIG 1000ML POUR BTL (IV SOLUTION) ×1 IMPLANT
PACK TOTAL KNEE CUSTOM (KITS) ×1 IMPLANT
PENCIL SMOKE EVACUATOR (MISCELLANEOUS) ×1 IMPLANT
PIN FIX SIGMA LCS THRD HI (PIN) IMPLANT
PROTECTOR NERVE ULNAR (MISCELLANEOUS) ×1 IMPLANT
SET HNDPC FAN SPRY TIP SCT (DISPOSABLE) ×1 IMPLANT
SET PAD KNEE POSITIONER (MISCELLANEOUS) ×1 IMPLANT
SPIKE FLUID TRANSFER (MISCELLANEOUS) ×2 IMPLANT
SUT MNCRL AB 4-0 PS2 18 (SUTURE) ×1 IMPLANT
SUT STRATAFIX PDS+ 0 24IN (SUTURE) ×1 IMPLANT
SUT VIC AB 1 CT1 36 (SUTURE) ×1 IMPLANT
SUT VIC AB 2-0 CT1 TAPERPNT 27 (SUTURE) ×2 IMPLANT
SYR 3ML LL SCALE MARK (SYRINGE) ×1 IMPLANT
TOWEL GREEN STERILE FF (TOWEL DISPOSABLE) ×1 IMPLANT
TRAY FOLEY MTR SLVR 16FR STAT (SET/KITS/TRAYS/PACK) ×1 IMPLANT
TUBE SUCTION HIGH CAP CLEAR NV (SUCTIONS) ×1 IMPLANT
WATER STERILE IRR 1000ML POUR (IV SOLUTION) ×2 IMPLANT
WRAP KNEE MAXI GEL POST OP (GAUZE/BANDAGES/DRESSINGS) ×1 IMPLANT

## 2024-04-22 NOTE — Anesthesia Procedure Notes (Addendum)
 Spinal  Patient location during procedure: OR Reason for block: surgical anesthesia Staffing Performed: resident/CRNA  Resident/CRNA: Ezzie Holstein, CRNA Performed by: Ezzie Holstein, CRNA Authorized by: Tura Gaines, MD   Preanesthetic Checklist Completed: patient identified, IV checked, site marked, risks and benefits discussed, surgical consent, monitors and equipment checked, pre-op evaluation and timeout performed Spinal Block Patient position: sitting Prep: DuraPrep Patient monitoring: heart rate, cardiac monitor, continuous pulse ox and blood pressure Approach: midline Location: L3-4 Injection technique: single-shot Needle Needle type: Pencan  Needle gauge: 24 G Needle length: 9 cm Assessment Sensory level: T4 Events: CSF return Additional Notes Dr. Yvonnie Heritage present for preprocedural time out and for spinal placement.

## 2024-04-22 NOTE — Evaluation (Signed)
 Physical Therapy Evaluation Patient Details Name: [name] MRN: [id number] DOB: 30-Aug-1956 Today's Date: 04/22/2024  History of Present Illness  68 yo s/p LTKA 04/22/2024 with PMH with R bicep tear and HTN  Clinical Impression  Pt is s/pL TKA resulting in the deficits listed below (see PT Problem List). .  Pt will benefit from acute skilled PT to increase their independence and safety with mobility to allow discharge. Tolerated POD0 session well this evening. Should be able to progress well in the morning and either DC after first session .           If plan is discharge home, recommend the following: A little help with walking and/or transfers;Assist for transportation;A little help with bathing/dressing/bathroom   Can travel by private vehicle        Equipment Recommendations None recommended by PT (pt has RW already)  Recommendations for Other Services       Functional Status Assessment Patient has had a recent decline in their functional status and demonstrates the ability to make significant improvements in function in a reasonable and predictable amount of time.     Precautions / Restrictions Precautions Precautions: Knee Precaution/Restrictions Comments: educated with importance of knee position in full extension when resting and tips to maintain that extension Restrictions Weight Bearing Restrictions Per Provider Order: No      Mobility  Bed Mobility Overal bed mobility: Modified Independent             General bed mobility comments: assisted with LLE off the bed    Transfers Overall transfer level: Needs assistance Equipment used: Rolling walker (2 wheels) Transfers: Sit to/from Stand Sit to Stand: Min assist           General transfer comment: edcuated on sadety and use of RW    Ambulation/Gait Ambulation/Gait assistance: Min assist Gait Distance (Feet): 50 Feet Assistive device: Rolling walker (2 wheels) Gait Pattern/deviations: Step-to  pattern       General Gait Details: step to pattern due to no glut muscle activity returned at this time. , will be able to progress to step through soon. Very labored and fatigued by the end of the session.  Stairs            Wheelchair Mobility     Tilt Bed    Modified Rankin (Stroke Patients Only)       Balance Overall balance assessment: Needs assistance Sitting-balance support: Feet supported Sitting balance-Leahy Scale: Good     Standing balance support: Bilateral upper extremity supported, During functional activity Standing balance-Leahy Scale: Fair                               Pertinent Vitals/Pain Pain Assessment Pain Assessment: 0-10 Pain Score: 5  Pain Location: L knee anterior Pain Descriptors / Indicators: Aching, Sore Pain Intervention(s): Limited activity within patient's tolerance, Monitored during session, Ice applied, RN gave pain meds during session    Home Living Family/patient expects to be discharged to:: Private residence Living Arrangements: Spouse/significant other Available Help at Discharge: Family Type of Home: House Home Access: Stairs to enter Entrance Stairs-Rails: Right Entrance Stairs-Number of Steps: 3   Home Layout: One level        Prior Function Prior Level of Function : Independent/Modified Independent                     Extremity/Trunk Assessment  Lower Extremity Assessment Lower Extremity Assessment: LLE deficits/detail LLE Deficits / Details: knee extension slight from neutral ( about 5 degress with quad set suping) 3-/5 for quads, no SLR Independent yet, and no glut contraction present yet, spinal was still a bit present.       Communication   Communication Communication: No apparent difficulties    Cognition Arousal: Alert Behavior During Therapy: WFL for tasks assessed/performed   PT - Cognitive impairments: No apparent impairments                          Following commands: Intact       Cueing       General Comments      Exercises Total Joint Exercises Ankle Circles/Pumps: AROM, 10 reps, Supine, Both Quad Sets: AROM, Left, 10 reps, Supine Gluteal Sets:  (unable no glut return yet) Heel Slides: AAROM, 10 reps, Supine, Left Straight Leg Raises: AAROM, Supine, 10 reps, Left Goniometric ROM: 5-75   Assessment/Plan    PT Assessment Patient needs continued PT services  PT Problem List Decreased strength;Decreased range of motion;Decreased activity tolerance;Decreased mobility       PT Treatment Interventions DME instruction;Gait training;Stair training;Functional mobility training;Therapeutic activities;Therapeutic exercise;Patient/family education    PT Goals (Current goals can be found in the Care Plan section)  Acute Rehab PT Goals Patient Stated Goal: to be able to go on their trip in future PT Goal Formulation: With patient Time For Goal Achievement: 05/06/24 Potential to Achieve Goals: Good    Frequency 7X/week     Co-evaluation               AM-PAC PT "6 Clicks" Mobility  Outcome Measure Help needed turning from your back to your side while in a flat bed without using bedrails?: A Little Help needed moving from lying on your back to sitting on the side of a flat bed without using bedrails?: A Little Help needed moving to and from a bed to a chair (including a wheelchair)?: A Little Help needed standing up from a chair using your arms (e.g., wheelchair or bedside chair)?: A Little Help needed to walk in hospital room?: A Little Help needed climbing 3-5 steps with a railing? : A Lot 6 Click Score: 17    End of Session Equipment Utilized During Treatment: Gait belt Activity Tolerance: Patient tolerated treatment well Patient left: in chair;with call bell/phone within reach (educated and agreed to no get up indpendently and pt showed how to call nurse, chair alarm not present or on,) Nurse Communication:  Mobility status PT Visit Diagnosis: Other abnormalities of gait and mobility (R26.89)    Time: 1700-1740 PT Time Calculation (min) (ACUTE ONLY): 40 min   Charges:   PT Evaluation $PT Eval Low Complexity: 1 Low   PT General Charges $$ ACUTE PT VISIT: 1 Visit         [name], PT, MPT Acute Rehabilitation Services Office: [phone] If a weekend: secure chat groups: [facility] PT, [facility] OT, [facility] SLP 04/22/2024   Laporsche Hoeger 04/22/2024, 6:07 PM

## 2024-04-22 NOTE — Progress Notes (Signed)
 Spouse has cpap device and mask / hose in waiting

## 2024-04-22 NOTE — Progress Notes (Signed)
   04/22/24 2258  BiPAP/CPAP/SIPAP  $ Non-Invasive Home Ventilator  Initial  BiPAP/CPAP/SIPAP Pt Type Adult  BiPAP/CPAP/SIPAP Resmed  Mask Type Nasal mask  Respiratory Rate 18 breaths/min  Patient Home Machine Yes  Safety Check Completed by RT for Home Unit Yes, no issues noted  Patient Home Mask Yes  Patient Home Tubing Yes  Auto Titrate No (home settings)

## 2024-04-22 NOTE — Anesthesia Postprocedure Evaluation (Signed)
 Anesthesia Post Note  Patient: [name]  Procedure(s) Performed: ARTHROPLASTY, KNEE, TOTAL (Left: Knee)     Patient location during evaluation: PACU Anesthesia Type: Spinal Level of consciousness: oriented and awake and alert Pain management: pain level controlled Vital Signs Assessment: post-procedure vital signs reviewed and stable Respiratory status: spontaneous breathing, respiratory function stable and nonlabored ventilation Cardiovascular status: blood pressure returned to baseline and stable Postop Assessment: no headache, no backache, no apparent nausea or vomiting and spinal receding Anesthetic complications: no   No notable events documented.  Last Vitals:  Vitals:   04/22/24 1330 04/22/24 1345  BP: 118/83 114/82  Pulse: (!) 58 (!) 54  Resp: 13 10  Temp:    SpO2: 93% 90%    Last Pain:  Vitals:   04/22/24 1345  TempSrc:   PainSc: Asleep                 [name] A.

## 2024-04-22 NOTE — Discharge Instructions (Signed)

## 2024-04-22 NOTE — Transfer of Care (Signed)
 Immediate Anesthesia Transfer of Care Note  Patient: [name]  Procedure(s) Performed: ARTHROPLASTY, KNEE, TOTAL (Left: Knee)  Patient Location: PACU  Anesthesia Type:Spinal  Level of Consciousness: awake and patient cooperative  Airway & Oxygen Therapy: Patient Spontanous Breathing and Patient connected to face mask oxygen  Post-op Assessment: Report given to RN and Post -op Vital signs reviewed and stable  Post vital signs: Reviewed and stable  Last Vitals:  Vitals Value Taken Time  BP 136/87 04/22/24 1306  Temp 36.4 C 04/22/24 1306  Pulse 88 04/22/24 1310  Resp 14 04/22/24 1310  SpO2 95 % 04/22/24 1310  Vitals shown include unfiled device data.  Last Pain:  Vitals:   04/22/24 1050  TempSrc:   PainSc: 0-No pain      Patients Stated Pain Goal: 4 (04/22/24 0916)  Complications: No notable events documented.

## 2024-04-22 NOTE — Op Note (Signed)
 NAME:  [name]                      MEDICAL RECORD NO.:  [id number]                             FACILITY:  [facility]      PHYSICIAN:  Azalea Lento. Bernard Brick, M.D.  DATE OF BIRTH:  Feb 29, 1956      DATE OF PROCEDURE:  04/22/2024                                     OPERATIVE REPORT         PREOPERATIVE DIAGNOSIS:  Left knee osteoarthritis.      POSTOPERATIVE DIAGNOSIS:  Left knee osteoarthritis.      FINDINGS:  The patient was noted to have complete loss of cartilage and   bone-on-bone arthritis with associated osteophytes in the medial and patellofemoral compartments of   the knee with a significant synovitis and associated effusion.  The patient had failed months of conservative treatment including medications, injection therapy, activity modification.     PROCEDURE:  Left total knee replacement.      COMPONENTS USED:  DePuy Attune FB CR MS knee   system, a size 6 femur, 8 tibia, size 7 mm CR MS AOX insert, and 38 anatomic patellar   button.      SURGEON:  Azalea Lento. Bernard Brick, M.D.      ASSISTANT:  Kim Pen, PA-C.      ANESTHESIA:  Regional and Spinal.      SPECIMENS:  None.      COMPLICATION:  None.      DRAINS:  None.  EBL: <200 cc      TOURNIQUET TIME:  34 min at 225 mmHg     The patient was stable to the recovery room.      INDICATION FOR PROCEDURE:  [name] is a 68 y.o. male patient of   mine.  The patient had been seen, evaluated, and treated for months conservatively in the   office with medication, activity modification, and injections.  The patient had   radiographic changes of bone-on-bone arthritis with endplate sclerosis and osteophytes noted.  Based on the radiographic changes and failed conservative measures, the patient   decided to proceed with definitive treatment, total knee replacement.  Risks of infection, DVT, component failure, need for revision surgery, neurovascular injury were reviewed in the office setting.  The postop course was  reviewed stressing the efforts to maximize post-operative satisfaction and function.  Consent was obtained for benefit of pain   relief.      PROCEDURE IN DETAIL:  The patient was brought to the operative theater.   Once adequate anesthesia, preoperative antibiotics, 2 gm of of Ancef ,1 gm of Tranexamic Acid, and 10 mg of Decadron  administered, the patient was positioned supine with a left thigh tourniquet placed.  The  left lower extremity was prepped and draped in sterile fashion.  A time-   out was performed identifying the patient, planned procedure, and the appropriate extremity.      The left lower extremity was placed in the [facility] leg holder.  The leg was   exsanguinated, tourniquet elevated to 225 mmHg.  A midline incision was   made followed by median parapatellar arthrotomy.  Following initial   exposure, attention  was first directed to the patella.  Precut   measurement was noted to be 25 mm.  I resected down to 14 mm and used a   38 anatomic patellar button to restore patellar height as well as cover the cut surface.      The lug holes were drilled and a metal shim was placed to protect the   patella from retractors and saw blade during the procedure.      At this point, attention was now directed to the femur.  The femoral   canal was opened with a drill, irrigated to try to prevent fat emboli.  An   intramedullary rod was passed at 5 degrees valgus, 9 mm of bone was   resected off the distal femur.  Following this resection, the tibia was   subluxated anteriorly.  Using the extramedullary guide, 2 mm of bone was resected off   the proximal media tibia.  We confirmed the gap would be   stable medially and laterally with a size 5 spacer block as well as confirmed that the tibial cut was perpendicular in the coronal plane, checking with an alignment rod.      Once this was done, I sized the femur to be a size 6 in the anterior-   posterior dimension, chose a standard component  based on medial and   lateral dimension.  The size 6 rotation block was then pinned in   position anterior referenced using the C-clamp to set rotation.  The   anterior, posterior, and  chamfer cuts were made without difficulty nor   notching making certain that I was along the anterior cortex to help   with flexion gap stability.      The final box cut was made off the lateral aspect of distal femur.      At this point, the tibia was sized to be a size 8.  The size 8 tray was   then pinned in position through the medial third of the tubercle,   drilled, and keel punched.  Trial reduction was now carried with a 6 femur,  8 tibia, a size 7 mm CR MS insert, and the 38 anatomic patella botton.  The knee was brought to full extension with good flexion stability with the patella   tracking through the trochlea without application of pressure.  Given   all these findings the trial components removed.  Final components were   opened and cement was mixed.  The knee was irrigated with normal saline solution and pulse lavage.  The synovial lining was   then injected with 30 cc of 0.25% Marcaine  with epinephrine , 1 cc of Toradol  and 30 cc of NS for a total of 61 cc.     Final implants were then cemented onto cleaned and dried cut surfaces of bone with the knee brought to extension with a size 7 mm CR MS trial insert.      Once the cement had fully cured, excess cement was removed   throughout the knee.  I confirmed that I was satisfied with the range of   motion and stability, and the final size 7 mm CR MS AOX insert was chosen.  It was   placed into the knee.      The tourniquet had been let down at 34 minutes.  No significant   hemostasis was required.  The extensor mechanism was then reapproximated using #1 Vicryl and #1 Stratafix sutures with the knee   in  flexion.  The   remaining wound was closed with 2-0 Vicryl and running 4-0 Monocryl.   The knee was cleaned, dried, dressed sterilely using  Dermabond and   Aquacel dressing.  The patient was then   brought to recovery room in stable condition, tolerating the procedure   well.   Please note that Physician Assistant, Kim Pen, PA-C was present for the entirety of the case, and was utilized for pre-operative positioning, peri-operative retractor management, general facilitation of the procedure and for primary wound closure at the end of the case.              Azalea Lento Bernard Brick, M.D.    04/22/2024 10:04 AM

## 2024-04-22 NOTE — Interval H&P Note (Signed)
 History and Physical Interval Note:  04/22/2024 10:04 AM  [name]  has presented today for surgery, with the diagnosis of Left knee osteoarthritis.  The various methods of treatment have been discussed with the patient and family. After consideration of risks, benefits and other options for treatment, the patient has consented to  Procedure(s): ARTHROPLASTY, KNEE, TOTAL (Left) as a surgical intervention.  The patient's history has been reviewed, patient examined, no change in status, stable for surgery.  I have reviewed the patient's chart and labs.  Questions were answered to the patient's satisfaction.     Bevin Bucks

## 2024-04-22 NOTE — Anesthesia Procedure Notes (Addendum)
 Procedure Name: MAC Date/Time: 04/22/2024 11:31 AM  Performed by: Ezzie Holstein, CRNAPre-anesthesia Checklist: Patient identified, Emergency Drugs available, Suction available and Patient being monitored Oxygen Delivery Method: Simple face mask Ventilation: Oral airway inserted - appropriate to patient size and Nasal airway inserted- appropriate to patient size Placement Confirmation: positive ETCO2 Dental Injury: Teeth and Oropharynx as per pre-operative assessment  Comments: 8.0 NPA inserted to R nare, easy insertion.

## 2024-04-22 NOTE — Anesthesia Procedure Notes (Signed)
 Anesthesia Regional Block: Adductor canal block   Pre-Anesthetic Checklist: , timeout performed,  Correct Patient, Correct Site, Correct Laterality,  Correct Procedure, Correct Position, site marked,  Risks and benefits discussed,  Surgical consent,  Pre-op evaluation,  At surgeon's request and post-op pain management  Laterality: Left  Prep: chloraprep       Needles:  Injection technique: Single-shot  Needle Type: Echogenic Stimulator Needle     Needle Length: 10cm  Needle Gauge: 21   Needle insertion depth: 7 cm   Additional Needles:   Narrative:  Start time: 04/22/2024 10:40 AM End time: 04/22/2024 10:45 AM Injection made incrementally with aspirations every 5 mL.  Performed by: Personally  Anesthesiologist: Tura Gaines, MD  Additional Notes: Timeout performed. Patient sedated. Relevant anatomy ID'd using US . Incremental 2-5ml injection of LA with frequent aspiration. Patient tolerated procedure well.

## 2024-04-22 NOTE — Plan of Care (Signed)

## 2024-04-23 ENCOUNTER — Encounter (HOSPITAL_COMMUNITY): Payer: Self-pay | Admitting: Orthopedic Surgery

## 2024-04-23 ENCOUNTER — Other Ambulatory Visit: Payer: Self-pay | Admitting: Internal Medicine

## 2024-04-23 DIAGNOSIS — Z7982 Long term (current) use of aspirin: Secondary | ICD-10-CM | POA: Diagnosis not present

## 2024-04-23 DIAGNOSIS — I2510 Atherosclerotic heart disease of native coronary artery without angina pectoris: Secondary | ICD-10-CM | POA: Diagnosis not present

## 2024-04-23 DIAGNOSIS — M1712 Unilateral primary osteoarthritis, left knee: Secondary | ICD-10-CM | POA: Diagnosis not present

## 2024-04-23 DIAGNOSIS — Z79899 Other long term (current) drug therapy: Secondary | ICD-10-CM | POA: Diagnosis not present

## 2024-04-23 DIAGNOSIS — I10 Essential (primary) hypertension: Secondary | ICD-10-CM | POA: Diagnosis not present

## 2024-04-23 LAB — BASIC METABOLIC PANEL WITH GFR
Anion gap: 7 (ref 5–15)
BUN: 23 mg/dL (ref 8–23)
CO2: 25 mmol/L (ref 22–32)
Calcium: 9 mg/dL (ref 8.9–10.3)
Chloride: 103 mmol/L (ref 98–111)
Creatinine, Ser: 1.61 mg/dL — ABNORMAL HIGH (ref 0.61–1.24)
GFR, Estimated: 47 mL/min — ABNORMAL LOW (ref >60)
Glucose, Bld: 136 mg/dL — ABNORMAL HIGH (ref 70–99)
Potassium: 4.1 mmol/L (ref 3.5–5.1)
Sodium: 135 mmol/L (ref 135–145)

## 2024-04-23 LAB — CBC
HCT: 39.6 % (ref 39.0–52.0)
Hemoglobin: 13.5 g/dL (ref 13.0–17.0)
MCH: 31.8 pg (ref 26.0–34.0)
MCHC: 34.1 g/dL (ref 30.0–36.0)
MCV: 93.2 fL (ref 80.0–100.0)
Platelets: 226 10*3/uL (ref 150–400)
RBC: 4.25 MIL/uL (ref 4.22–5.81)
RDW: 13 % (ref 11.5–15.5)
WBC: 15.9 10*3/uL — ABNORMAL HIGH (ref 4.0–10.5)
nRBC: 0 % (ref 0.0–0.2)

## 2024-04-23 MED ORDER — OXYCODONE HCL 5 MG PO TABS: 5.0000 mg | ORAL_TABLET | ORAL | 0 refills | Status: DC | PRN

## 2024-04-23 MED ORDER — METHOCARBAMOL 500 MG PO TABS: 500.0000 mg | ORAL_TABLET | Freq: Four times a day (QID) | ORAL | 2 refills | Status: DC | PRN

## 2024-04-23 MED ORDER — SENNA 8.6 MG PO TABS: 2.0000 | ORAL_TABLET | Freq: Every day | ORAL | 0 refills | Status: AC

## 2024-04-23 MED ORDER — POLYETHYLENE GLYCOL 3350 17 G PO PACK
17.0000 g | PACK | Freq: Two times a day (BID) | ORAL | 0 refills | Status: DC
Start: 2024-04-23 — End: 2024-08-25

## 2024-04-23 MED ORDER — ACETAMINOPHEN 500 MG PO TABS: 1000.0000 mg | ORAL_TABLET | Freq: Four times a day (QID) | ORAL | Status: AC

## 2024-04-23 MED ORDER — ASPIRIN 81 MG PO CHEW: 81.0000 mg | CHEWABLE_TABLET | Freq: Two times a day (BID) | ORAL | 0 refills | Status: AC

## 2024-04-23 NOTE — Plan of Care (Signed)
   Problem: Coping: Goal: Level of anxiety will decrease Outcome: Progressing   Problem: Safety: Goal: Ability to remain free from injury will improve Outcome: Progressing   Problem: Pain Managment: Goal: General experience of comfort will improve and/or be controlled Outcome: Progressing

## 2024-04-23 NOTE — TOC Transition Note (Signed)
 Transition of Care [facility]) - Discharge Note   Patient Details  Name: [name] MRN: [id number] Date of Birth: [birth year]  Transition of Care [facility]) CM/SW Contact:  Delilah Fend, LCSW Phone Number: 04/23/2024, 10:07 AM   Clinical Narrative:     Met with patient who confirms he has needed DME in the home.  OPPT already arranged with [facility] ([place]).  No further TOC needs.   Final next level of care: OP Rehab Barriers to Discharge: No Barriers Identified   Patient Goals and CMS Choice Patient states their goals for this hospitalization and ongoing recovery are:: return home          Discharge Placement                       Discharge Plan and Services Additional resources added to the After Visit Summary for                  DME Arranged: N/A DME Agency: NA                  Social Drivers of Health (SDOH) Interventions SDOH Screenings   Food Insecurity: No Food Insecurity (04/22/2024)  Housing: Low Risk  (04/22/2024)  Transportation Needs: No Transportation Needs (04/22/2024)  Utilities: Not At Risk (04/22/2024)  Alcohol Screen: Low Risk  (12/24/2023)  Depression (PHQ2-9): Low Risk  (12/24/2023)  Financial Resource Strain: Low Risk  (12/24/2023)  Physical Activity: Sufficiently Active (12/24/2023)  Social Connections: Moderately Integrated (04/22/2024)  Stress: No Stress Concern Present (12/24/2023)  Tobacco Use: Low Risk  (04/22/2024)  Health Literacy: Adequate Health Literacy (12/24/2023)     Readmission Risk Interventions     No data to display

## 2024-04-23 NOTE — Progress Notes (Signed)
 Pt received in bed, wife at bedside. Pt able to demonstrate good safety awareness with mobility using RW for support during gait. Continues step to pattern with heavy reliance on RW. Able to negotiate 10 steps with single rail and CG/S. Wife present for all activity giving appropriate cues to pt. Pt appears functionally ready for d/c home. No DME needs.    04/23/24 1200  PT Visit Information  Assistance Needed +1  History of Present Illness 68 yo s/p LTKA 04/22/2024 with PMH with R bicep tear and HTN  Subjective Data  Patient Stated Goal to be able to go on their trip in future  Precautions  Precautions Knee  Precaution Booklet Issued Yes (comment)  Recall of Precautions/Restrictions Intact  Precaution/Restrictions Comments educated with importance of knee position in full extension when resting and tips to maintain that extension  Restrictions  Weight Bearing Restrictions Per Provider Order Yes  LLE Weight Bearing Per Provider Order WBAT  Pain Assessment  Pain Assessment 0-10  Pain Score 2  Pain Location L knee anterior  Pain Descriptors / Indicators Aching;Sore  Pain Intervention(s) Monitored during session  Cognition  Arousal Alert  Behavior During Therapy WFL for tasks assessed/performed  PT - Cognitive impairments No apparent impairments  Following Commands  Following commands Intact  Cueing  Cueing Techniques Verbal cues  Communication  Communication No apparent difficulties  Bed Mobility  Overal bed mobility Modified Independent  General bed mobility comments  (No assistance given for L LE)  Transfers  Overall transfer level Needs assistance  Equipment used Rolling walker (2 wheels)  Transfers Sit to/from Stand  Sit to Stand Supervision  General transfer comment Good demonstration of technique and hand placement  Ambulation/Gait  Ambulation/Gait assistance Supervision  Gait Distance (Feet) 100 Feet  Assistive device Rolling walker (2 wheels)  Gait Pattern/deviations  Step-to pattern  General Gait Details Less fatigued this date. Cues given for heel strike and knee flexion to promote more normalized gait  Gait velocity decr  Stairs Yes  Stairs assistance Contact guard assist  Stair Management One rail Left;Step to pattern;Forwards  Number of Stairs 10  General stair comments Good technique and recall of sequencing. Wife present  Balance  Overall balance assessment Needs assistance  Sitting-balance support Feet supported  Sitting balance-Leahy Scale Good  Standing balance support Bilateral upper extremity supported;During functional activity;Reliant on assistive device for balance  Standing balance-Leahy Scale Fair  General Comments  General comments (skin integrity, edema, etc.) Pt and wife educated on proper positioning at home to promote knee extension  Exercises  Exercises Total Joint;Other exercises  Other Exercises  Other Exercises Pt with a good understanding of HEP  PT - End of Session  Equipment Utilized During Treatment Gait belt  Activity Tolerance Patient tolerated treatment well  Patient left in chair;with call bell/phone within reach  Nurse Communication Mobility status   PT - Assessment/Plan  PT Visit Diagnosis Other abnormalities of gait and mobility (R26.89)  PT Frequency (ACUTE ONLY) 7X/week  Follow Up Recommendations Follow physician's recommendations for discharge plan and follow up therapies  Patient can return home with the following A little help with walking and/or transfers;Assist for transportation;A little help with bathing/dressing/bathroom  PT equipment None recommended by PT  AM-PAC PT "6 Clicks" Mobility Outcome Measure (Version 2)  Help needed turning from your back to your side while in a flat bed without using bedrails? 3  Help needed moving from lying on your back to sitting on the side of a flat  bed without using bedrails? 3  Help needed moving to and from a bed to a chair (including a wheelchair)? 3  Help  needed standing up from a chair using your arms (e.g., wheelchair or bedside chair)? 3  Help needed to walk in hospital room? 3  Help needed climbing 3-5 steps with a railing?  3  6 Click Score 18  Consider Recommendation of Discharge To: Home with [facility]  Progressive Mobility  What is the highest level of mobility based on the progressive mobility assessment? Level 5 (Walks with assist in room/hall) - Balance while stepping forward/back and can walk in room with assist - Complete  Activity Ambulated with assistance in hallway  PT Goal Progression  Progress towards PT goals Progressing toward goals  PT Time Calculation  PT Start Time (ACUTE ONLY) 1112  PT Stop Time (ACUTE ONLY) 1145  PT Time Calculation (min) (ACUTE ONLY) 33 min  PT General Charges  $$ ACUTE PT VISIT 1 Visit  PT Treatments  $Gait Training 8-22 mins  $Therapeutic Exercise 8-22 mins  Melvyn Stagers, PTA 04/23/2024

## 2024-04-23 NOTE — Progress Notes (Signed)
 Subjective: 1 Day Post-Op Procedure(s) (LRB): ARTHROPLASTY, KNEE, TOTAL (Left) Patient reports pain as mild.   Patient seen in rounds with Dr. Bernard Brick. Patient is well, and has had no acute complaints or problems. No acute events overnight. Foley catheter removed. Patient ambulated 50 feet with PT.  We will start therapy today.   Objective: Vital signs in last 24 hours: Temp:  [97.6 F (36.4 C)-98.2 F (36.8 C)] 98 F (36.7 C) (06/06 0546) Pulse Rate:  [49-94] 60 (06/06 0546) Resp:  [10-18] 16 (06/06 0546) BP: (111-182)/(82-117) 111/85 (06/06 0546) SpO2:  [90 %-100 %] 94 % (06/06 0546) Weight:  [118.4 kg] 118.4 kg (06/05 1501)  Intake/Output from previous day:  Intake/Output Summary (Last 24 hours) at 04/23/2024 0752 Last data filed at 04/23/2024 0646 Gross per 24 hour  Intake 1801.33 ml  Output 1675 ml  Net 126.33 ml     Intake/Output this shift: No intake/output data recorded.  Labs: Recent Labs    04/23/24 0312  HGB 13.5   Recent Labs    04/23/24 0312  WBC 15.9*  RBC 4.25  HCT 39.6  PLT 226   Recent Labs    04/23/24 0312  NA 135  K 4.1  CL 103  CO2 25  BUN 23  CREATININE 1.61*  GLUCOSE 136*  CALCIUM  9.0   No results for input(s): "LABPT", "INR" in the last 72 hours.  Exam: General - Patient is Alert and Oriented Extremity - Neurologically intact Sensation intact distally Intact pulses distally Dorsiflexion/Plantar flexion intact Dressing - dressing C/D/I Motor Function - intact, moving foot and toes well on exam.   Past Medical History:  Diagnosis Date   Aortic valve insufficiency    Arthritis    Coronary artery disease    Elevated liver enzymes    History of kidney stones    Hypertension    Hypertriglyceridemia    OSA on CPAP    POST  SLEEP APNEA SURGERY --- PER SLEEP STUDY 06-26-15-2006  MODERATE OSA   USES CPAP DAILY   Pre-diabetes    Renal disorder    Scrotal swelling    Shortness of breath dyspnea    with exertion    Splenomegaly    Urgency of urination    OCCASIONAL    Assessment/Plan: 1 Day Post-Op Procedure(s) (LRB): ARTHROPLASTY, KNEE, TOTAL (Left) Principal Problem:   S/P total knee arthroplasty, left  Estimated body mass index is 33.51 kg/m as calculated from the following:   Height as of this encounter: 6\' 2"  (1.88 m).   Weight as of this encounter: 118.4 kg. Advance diet Up with therapy D/C IV fluids   Patient's anticipated LOS is less than 2 midnights, meeting these requirements: - Younger than 61 - Lives within 1 hour of care - Has a competent adult at home to recover with post-op recover - NO history of  - Chronic pain requiring opiods  - Diabetes  - Coronary Artery Disease  - Heart failure  - Heart attack  - Stroke  - DVT/VTE  - Cardiac arrhythmia  - Respiratory Failure/COPD  - Renal failure  - Anemia  - Advanced Liver disease     DVT Prophylaxis - Aspirin  Weight bearing as tolerated.  Hgb stable at 13.5 this AM Cr. 1.61 - will d/c celebrex  Plan is to go Home after hospital stay. Plan for discharge today following 1-2 sessions of PT as long as they are meeting their goals. Patient is scheduled for OPPT. Follow up in the office in  2 weeks.   Kim Pen, PA-C Orthopedic Surgery [phone] 04/23/2024, 7:52 AM

## 2024-04-26 DIAGNOSIS — M25662 Stiffness of left knee, not elsewhere classified: Secondary | ICD-10-CM | POA: Diagnosis not present

## 2024-04-26 DIAGNOSIS — M25562 Pain in left knee: Secondary | ICD-10-CM | POA: Diagnosis not present

## 2024-04-28 DIAGNOSIS — M25662 Stiffness of left knee, not elsewhere classified: Secondary | ICD-10-CM | POA: Diagnosis not present

## 2024-04-28 DIAGNOSIS — M25562 Pain in left knee: Secondary | ICD-10-CM | POA: Diagnosis not present

## 2024-04-30 DIAGNOSIS — M25562 Pain in left knee: Secondary | ICD-10-CM | POA: Diagnosis not present

## 2024-04-30 DIAGNOSIS — M25662 Stiffness of left knee, not elsewhere classified: Secondary | ICD-10-CM | POA: Diagnosis not present

## 2024-05-03 DIAGNOSIS — M25562 Pain in left knee: Secondary | ICD-10-CM | POA: Diagnosis not present

## 2024-05-03 DIAGNOSIS — M25662 Stiffness of left knee, not elsewhere classified: Secondary | ICD-10-CM | POA: Diagnosis not present

## 2024-05-03 NOTE — Discharge Summary (Signed)
 Patient ID: [name] MRN: [id number] DOB/AGE: 68-Mar-1957 68 y.o.  Admit date: 04/22/2024 Discharge date: 04/23/2024  Admission Diagnoses:  Left knee osteoarthritis  Discharge Diagnoses:  Principal Problem:   S/P total knee arthroplasty, left   Past Medical History:  Diagnosis Date   Aortic valve insufficiency    Arthritis    Coronary artery disease    Elevated liver enzymes    History of kidney stones    Hypertension    Hypertriglyceridemia    OSA on CPAP    POST  SLEEP APNEA SURGERY --- PER SLEEP STUDY 06-26-15-2006  MODERATE OSA   USES CPAP DAILY   Pre-diabetes    Renal disorder    Scrotal swelling    Shortness of breath dyspnea    with exertion   Splenomegaly    Urgency of urination    OCCASIONAL    Surgeries: Procedure(s): ARTHROPLASTY, KNEE, TOTAL on 04/22/2024   Consultants:   Discharged Condition: Improved  Hospital Course: [name] is an 68 y.o. male who was admitted 04/22/2024 for operative treatment ofS/P total knee arthroplasty, left. Patient has severe unremitting pain that affects sleep, daily activities, and work/hobbies. After pre-op clearance the patient was taken to the operating room on 04/22/2024 and underwent  Procedure(s): ARTHROPLASTY, KNEE, TOTAL.    Patient was given perioperative antibiotics:  Anti-infectives (From admission, onward)    Start     Dose/Rate Route Frequency Ordered Stop   04/22/24 1800  ceFAZolin  (ANCEF ) IVPB 2g/100 mL premix        2 g 200 mL/hr over 30 Minutes Intravenous Every 6 hours 04/22/24 1450 04/23/24 0842   04/22/24 0900  ceFAZolin  (ANCEF ) IVPB 2g/100 mL premix        2 g 200 mL/hr over 30 Minutes Intravenous On call to O.R. 04/22/24 0859 04/22/24 1132        Patient was given sequential compression devices, early ambulation, and chemoprophylaxis to prevent DVT. Patient worked with PT and was meeting their goals regarding safe ambulation and transfers.  Patient benefited maximally from hospital stay  and there were no complications.    Recent vital signs: No data found.   Recent laboratory studies: No results for input(s): WBC, HGB, HCT, PLT, NA, K, CL, CO2, BUN, CREATININE, GLUCOSE, INR, CALCIUM  in the last 72 hours.  Invalid input(s): PT, 2   Discharge Medications:   Allergies as of 04/23/2024   No Known Allergies      Medication List     STOP taking these medications    aspirin  EC 81 MG tablet Replaced by: aspirin  81 MG chewable tablet   diclofenac 75 MG EC tablet Commonly known as: VOLTAREN       TAKE these medications    acetaminophen  500 MG tablet Commonly known as: TYLENOL  Take 2 tablets (1,000 mg total) by mouth every 6 (six) hours.   aspirin  81 MG chewable tablet Chew 1 tablet (81 mg total) by mouth 2 (two) times daily for 28 days. Replaces: aspirin  EC 81 MG tablet   ciclopirox 8 % solution Commonly known as: PENLAC Apply 1 Application topically once a week.   fenofibrate  160 MG tablet Take 160 mg by mouth daily.   Fish Oil 1200 MG Caps Take 1,200 mg by mouth in the morning.   folic acid  1 MG tablet Commonly known as: FOLVITE  Take 1 mg by mouth in the morning.   furosemide  20 MG tablet Commonly known as: LASIX  Take 20 mg by mouth in the morning.  ketoconazole 2 % cream Commonly known as: NIZORAL daily.   methocarbamol  500 MG tablet Commonly known as: ROBAXIN  Take 1 tablet (500 mg total) by mouth every 6 (six) hours as needed for muscle spasms.   oxyCODONE  5 MG immediate release tablet Commonly known as: Oxy IR/ROXICODONE  Take 1 tablet (5 mg total) by mouth every 4 (four) hours as needed for severe pain (pain score 7-10).   polyethylene glycol 17 g packet Commonly known as: MIRALAX  / GLYCOLAX  Take 17 g by mouth 2 (two) times daily.   rosuvastatin  5 MG tablet Commonly known as: CRESTOR  TAKE 1 TABLET(5 MG) BY MOUTH DAILY   senna 8.6 MG Tabs tablet Commonly known as: SENOKOT Take 2 tablets (17.2 mg  total) by mouth at bedtime for 14 days.   Systane 0.4-0.3 % Soln Generic drug: Polyethyl Glycol-Propyl Glycol Place 1 drop into both eyes in the morning.   tamsulosin  0.4 MG Caps capsule Commonly known as: FLOMAX  Take 1 capsule (0.4 mg total) by mouth daily.   traZODone  50 MG tablet Commonly known as: DESYREL  Take 50 mg by mouth at bedtime.   Vitamin D (Ergocalciferol) 1.25 MG (50000 UNIT) Caps capsule Commonly known as: DRISDOL Take 50,000 Units by mouth every Saturday.               Discharge Care Instructions  (From admission, onward)           Start     Ordered   04/23/24 0000  Change dressing       Comments: Maintain surgical dressing until follow up in the clinic. If the edges start to pull up, may reinforce with tape. If the dressing is no longer working, may remove and cover with gauze and tape, but must keep the area dry and clean.  Call with any questions or concerns.   04/23/24 0755            Diagnostic Studies: No results found.  Disposition: Discharge disposition: 01-Home or Self Care       Discharge Instructions     Call MD / Call 911   Complete by: As directed    If you experience chest pain or shortness of breath, CALL 911 and be transported to the hospital emergency room.  If you develope a fever above 101 F, pus (white drainage) or increased drainage or redness at the wound, or calf pain, call your surgeon's office.   Change dressing   Complete by: As directed    Maintain surgical dressing until follow up in the clinic. If the edges start to pull up, may reinforce with tape. If the dressing is no longer working, may remove and cover with gauze and tape, but must keep the area dry and clean.  Call with any questions or concerns.   Constipation Prevention   Complete by: As directed    Drink plenty of fluids.  Prune juice may be helpful.  You may use a stool softener, such as Colace (over the counter) 100 mg twice a day.  Use MiraLax   (over the counter) for constipation as needed.   Diet - low sodium heart healthy   Complete by: As directed    Increase activity slowly as tolerated   Complete by: As directed    Weight bearing as tolerated with assist device (walker, cane, etc) as directed, use it as long as suggested by your surgeon or therapist, typically at least 4-6 weeks.   Post-operative opioid taper instructions:   Complete by: As directed  POST-OPERATIVE OPIOID TAPER INSTRUCTIONS: It is important to wean off of your opioid medication as soon as possible. If you do not need pain medication after your surgery it is ok to stop day one. Opioids include: Codeine, Hydrocodone (Norco, Vicodin), Oxycodone (Percocet, oxycontin ) and hydromorphone  amongst others.  Long term and even short term use of opiods can cause: Increased pain response Dependence Constipation Depression Respiratory depression And more.  Withdrawal symptoms can include Flu like symptoms Nausea, vomiting And more Techniques to manage these symptoms Hydrate well Eat regular healthy meals Stay active Use relaxation techniques(deep breathing, meditating, yoga) Do Not substitute Alcohol to help with tapering If you have been on opioids for less than two weeks and do not have pain than it is ok to stop all together.  Plan to wean off of opioids This plan should start within one week post op of your joint replacement. Maintain the same interval or time between taking each dose and first decrease the dose.  Cut the total daily intake of opioids by one tablet each day Next start to increase the time between doses. The last dose that should be eliminated is the evening dose.      TED hose   Complete by: As directed    Use stockings (TED hose) for 2 weeks on both leg(s).  You may remove them at night for sleeping.        Follow-up Information     Claiborne Crew, MD. Schedule an appointment as soon as possible for a visit in 2 week(s).    Specialty: Orthopedic Surgery Contact information: [address] [place] [address] [phone]                  Signed: Earnie Gola 05/03/2024, 2:47 PM

## 2024-05-05 DIAGNOSIS — M25662 Stiffness of left knee, not elsewhere classified: Secondary | ICD-10-CM | POA: Diagnosis not present

## 2024-05-05 DIAGNOSIS — M25562 Pain in left knee: Secondary | ICD-10-CM | POA: Diagnosis not present

## 2024-05-07 DIAGNOSIS — M25662 Stiffness of left knee, not elsewhere classified: Secondary | ICD-10-CM | POA: Diagnosis not present

## 2024-05-07 DIAGNOSIS — M25562 Pain in left knee: Secondary | ICD-10-CM | POA: Diagnosis not present

## 2024-05-10 ENCOUNTER — Other Ambulatory Visit: Payer: Self-pay | Admitting: Internal Medicine

## 2024-05-10 DIAGNOSIS — M25562 Pain in left knee: Secondary | ICD-10-CM | POA: Diagnosis not present

## 2024-05-10 DIAGNOSIS — M25662 Stiffness of left knee, not elsewhere classified: Secondary | ICD-10-CM | POA: Diagnosis not present

## 2024-05-11 ENCOUNTER — Encounter: Payer: Self-pay | Admitting: Gastroenterology

## 2024-05-12 DIAGNOSIS — M25662 Stiffness of left knee, not elsewhere classified: Secondary | ICD-10-CM | POA: Diagnosis not present

## 2024-05-12 DIAGNOSIS — M25562 Pain in left knee: Secondary | ICD-10-CM | POA: Diagnosis not present

## 2024-05-14 ENCOUNTER — Ambulatory Visit: Admitting: Internal Medicine

## 2024-05-14 DIAGNOSIS — M25562 Pain in left knee: Secondary | ICD-10-CM | POA: Diagnosis not present

## 2024-05-14 DIAGNOSIS — M25662 Stiffness of left knee, not elsewhere classified: Secondary | ICD-10-CM | POA: Diagnosis not present

## 2024-05-17 DIAGNOSIS — M25562 Pain in left knee: Secondary | ICD-10-CM | POA: Diagnosis not present

## 2024-05-17 DIAGNOSIS — M25662 Stiffness of left knee, not elsewhere classified: Secondary | ICD-10-CM | POA: Diagnosis not present

## 2024-05-19 DIAGNOSIS — M25662 Stiffness of left knee, not elsewhere classified: Secondary | ICD-10-CM | POA: Diagnosis not present

## 2024-05-19 DIAGNOSIS — M25562 Pain in left knee: Secondary | ICD-10-CM | POA: Diagnosis not present

## 2024-05-24 DIAGNOSIS — M25562 Pain in left knee: Secondary | ICD-10-CM | POA: Diagnosis not present

## 2024-05-24 DIAGNOSIS — M25662 Stiffness of left knee, not elsewhere classified: Secondary | ICD-10-CM | POA: Diagnosis not present

## 2024-05-27 DIAGNOSIS — M25662 Stiffness of left knee, not elsewhere classified: Secondary | ICD-10-CM | POA: Diagnosis not present

## 2024-05-27 DIAGNOSIS — M25562 Pain in left knee: Secondary | ICD-10-CM | POA: Diagnosis not present

## 2024-05-28 ENCOUNTER — Telehealth: Payer: Self-pay

## 2024-05-28 DIAGNOSIS — Z5189 Encounter for other specified aftercare: Secondary | ICD-10-CM | POA: Diagnosis not present

## 2024-05-28 NOTE — Telephone Encounter (Signed)
   Pre-operative Risk Assessment    Patient Name: [name]  DOB: [birth year] MRN: [id number]   Date of last office visit: 04/14/24 Date of next office visit: Not scheduled   Request for Surgical Clearance    Procedure:  Right total knee arthroplasty  Date of Surgery:  Clearance 08/31/24                                Surgeon:  Dr. Donnice Car Surgeon's Group or Practice Name:  [facility] Phone number:  [phone] Fax number:  [phone]   Type of Clearance Requested:   - Medical    Type of Anesthesia:  Spinal   Additional requests/questions:    [name] [name]   05/28/2024, 1:48 PM

## 2024-05-31 NOTE — Telephone Encounter (Addendum)
 Patient seen 03/2024 by Dr. Jeffrie for knee surgery, had done 04/2024. Now pending surgery on contralateral knee. Given duration until surgery, may be best to schedule for virtual visit closer to time of surgery, ie 2 months. However, in meantime want to inquire from Dr. Jeffrie whether ascending TAA requires further imaging prior to potentially clearing at that VV - 46mm by echo 10/2023, not previously commented on CT in 2022, 2023. Dr. Jeffrie - Please route response to P CV DIV PREOP (the pre-op pool). Thank you.  After we hear back from Dr. Jeffrie would schedule VV closer to surgery time as above - when Callback is referred to schedule this, would also ensure he is taking ASA as per 03/2024 treatment plan.

## 2024-06-02 DIAGNOSIS — M25662 Stiffness of left knee, not elsewhere classified: Secondary | ICD-10-CM | POA: Diagnosis not present

## 2024-06-02 DIAGNOSIS — M25562 Pain in left knee: Secondary | ICD-10-CM | POA: Diagnosis not present

## 2024-06-02 NOTE — Telephone Encounter (Signed)
   Patient Name: [name]  DOB: 27-Feb-1956 MRN: [id number]  Primary Cardiologist: Vinie JAYSON Maxcy, MD  Chart reviewed as part of pre-operative protocol coverage. Given past medical history and time since last visit, based on ACC/AHA guidelines, [name] is at acceptable risk for the planned procedure without further cardiovascular testing.   Per Dr. Jeffrie 06/01/2024 Proceed with left total knee arthroplasty as planned. Dr. Ernie with low to moderate risk (known stable CAD).   The patient was advised that if he develops new symptoms prior to surgery to contact our office to arrange for a follow-up visit, and he verbalized understanding.  I will route this recommendation to the requesting party via Epic fax function and remove from pre-op pool.  Please call with questions.  Jimmy Satterfield, NP 06/02/2024, 8:00 AM

## 2024-06-07 ENCOUNTER — Ambulatory Visit: Admitting: Gastroenterology

## 2024-06-08 DIAGNOSIS — M25562 Pain in left knee: Secondary | ICD-10-CM | POA: Diagnosis not present

## 2024-06-08 DIAGNOSIS — M25662 Stiffness of left knee, not elsewhere classified: Secondary | ICD-10-CM | POA: Diagnosis not present

## 2024-06-11 DIAGNOSIS — M25662 Stiffness of left knee, not elsewhere classified: Secondary | ICD-10-CM | POA: Diagnosis not present

## 2024-06-11 DIAGNOSIS — M25562 Pain in left knee: Secondary | ICD-10-CM | POA: Diagnosis not present

## 2024-06-15 DIAGNOSIS — M25562 Pain in left knee: Secondary | ICD-10-CM | POA: Diagnosis not present

## 2024-06-15 DIAGNOSIS — M25662 Stiffness of left knee, not elsewhere classified: Secondary | ICD-10-CM | POA: Diagnosis not present

## 2024-06-18 DIAGNOSIS — M25562 Pain in left knee: Secondary | ICD-10-CM | POA: Diagnosis not present

## 2024-06-18 DIAGNOSIS — M25662 Stiffness of left knee, not elsewhere classified: Secondary | ICD-10-CM | POA: Diagnosis not present

## 2024-06-22 DIAGNOSIS — M25562 Pain in left knee: Secondary | ICD-10-CM | POA: Diagnosis not present

## 2024-06-22 DIAGNOSIS — M25662 Stiffness of left knee, not elsewhere classified: Secondary | ICD-10-CM | POA: Diagnosis not present

## 2024-06-25 DIAGNOSIS — M25662 Stiffness of left knee, not elsewhere classified: Secondary | ICD-10-CM | POA: Diagnosis not present

## 2024-06-25 DIAGNOSIS — M25562 Pain in left knee: Secondary | ICD-10-CM | POA: Diagnosis not present

## 2024-06-29 DIAGNOSIS — M25562 Pain in left knee: Secondary | ICD-10-CM | POA: Diagnosis not present

## 2024-06-29 DIAGNOSIS — M25662 Stiffness of left knee, not elsewhere classified: Secondary | ICD-10-CM | POA: Diagnosis not present

## 2024-07-09 DIAGNOSIS — M179 Osteoarthritis of knee, unspecified: Secondary | ICD-10-CM | POA: Diagnosis not present

## 2024-07-09 DIAGNOSIS — I872 Venous insufficiency (chronic) (peripheral): Secondary | ICD-10-CM | POA: Diagnosis not present

## 2024-07-09 DIAGNOSIS — E6609 Other obesity due to excess calories: Secondary | ICD-10-CM | POA: Diagnosis not present

## 2024-07-09 DIAGNOSIS — G4733 Obstructive sleep apnea (adult) (pediatric): Secondary | ICD-10-CM | POA: Diagnosis not present

## 2024-07-09 DIAGNOSIS — I10 Essential (primary) hypertension: Secondary | ICD-10-CM | POA: Diagnosis not present

## 2024-07-09 DIAGNOSIS — M47812 Spondylosis without myelopathy or radiculopathy, cervical region: Secondary | ICD-10-CM | POA: Diagnosis not present

## 2024-07-09 DIAGNOSIS — Z6833 Body mass index (BMI) 33.0-33.9, adult: Secondary | ICD-10-CM | POA: Diagnosis not present

## 2024-08-24 NOTE — Progress Notes (Signed)
 COVID Vaccine received:  []  No [x]  Yes Date of any COVID positive Test in last 90 days: none   PCP - Jerilynn Carnes, MD [phone]  cleared per Rosina Calin, PA's 08-04-24 note Cardiologist - K. Italy Hilty, MD  Lamarr Jerilynn, NP  cardiac clearance in 06-02-24 note   Chest x-ray - 07-28-2023  2v  Epic EKG -   04-13-24 Stress Test - 08-20-2021  Epic ECHO - 10-22-2023  Epic Cardiac Cath -  CT Coronary Calcium  score: 4017  on 10-17-22   Pacemaker / ICD device [x]  No []  Yes   Spinal Cord Stimulator:[x]  No []  Yes       History of Sleep Apnea? []  No [x]  Yes   CPAP used?- []  No [x]  Yes     Does the patient monitor blood sugar?   []  N/A   [x]  No []  Yes  Patient has: []  NO Hx DM   [x]  Pre-DM   []  DM1  []   DM2 Last A1c was:5.9 on 01-19-2024    No meds       Blood Thinner / Instructions:  none Aspirin  Instructions:  ASA 81mg    Hold x 5 days per patient   ERAS Protocol Ordered: []  No  [x]  Yes PRE-SURGERY []  ENSURE  [x]  G2   Patient is to be NPO after: 0815   Dental hx: []  Dentures:  [x]  N/A      []  Bridge or Partial:                   []  Loose or Damaged teeth:    Comments:    Activity level: Able to walk up 2 flights of stairs without becoming significantly short of breath or having chest pain?  []  No   [x]    Yes    Anesthesia review: CAD, aortic valve insuff, ?LFTs, fatty liver, splenomegaly, HTN, OSA- CPAP   Patient denies shortness of breath, fever, cough and chest pain at PAT appointment.   Patient verbalized understanding and agreement to the Pre-Surgical Instructions that were given to them at this PAT appointment. Patient was also educated of the need to review these PAT instructions again prior to his surgery.I reviewed the appropriate phone numbers to call if they have any and questions or concerns.

## 2024-08-24 NOTE — Patient Instructions (Signed)
 SURGICAL WAITING ROOM VISITATION Patients having surgery or a procedure may have no more than 2 support people in the waiting area - these visitors may rotate in the visitor waiting room.   If the patient needs to stay at the hospital during part of their recovery, the visitor guidelines for inpatient rooms apply.  PRE-OP VISITATION  Pre-op nurse will coordinate an appropriate time for 1 support person to accompany the patient in pre-op.  This support person may not rotate.  This visitor will be contacted when the time is appropriate for the visitor to come back in the pre-op area.  Please refer to the [facility] website for the visitor guidelines for Inpatients (after your surgery is over and you are in a regular room).  You are not required to quarantine at this time prior to your surgery. However, you must do this: Hand Hygiene often Do NOT share personal items Notify your provider if you are in close contact with someone who has COVID or you develop fever 100.4 or greater, new onset of sneezing, cough, sore throat, shortness of breath or body aches.  If you test positive for Covid or have been in contact with anyone that has tested positive in the last 10 days please notify you surgeon.    Your procedure is scheduled on:  Tuesday  August 31, 2024  Report to [facility] Main Entrance: [address] entrance where the [employer] is available.   Report to admitting at: 05:15    AM  Call this number if you have any questions or problems the morning of surgery [phone]  Do not eat food after Midnight the night prior to your surgery/procedure.  After Midnight you may have the following liquids until   04:15 AM  DAY OF SURGERY  Clear Liquid Diet Water  Black Coffee (sugar ok, NO MILK/CREAM OR CREAMERS)  Tea (sugar ok, NO MILK/CREAM OR CREAMERS) regular and decaf                             Plain Jell-O  with no fruit (NO RED)                                           Fruit  ices (not with fruit pulp, NO RED)                                     Popsicles (NO RED)                                                                  Juice: NO CITRUS JUICES: only apple, WHITE grape, WHITE cranberry Sports drinks like Gatorade or Powerade (NO RED)                   The day of surgery:  Drink ONE (1) Pre-Surgery G2 at   04:15  AM the morning of surgery. Drink in one sitting. Do not sip.  This drink was given to you during your hospital pre-op appointment visit. Nothing else to  drink after completing the Pre-Surgery G2 : No candy, chewing gum or throat lozenges.    FOLLOW ANY ADDITIONAL PRE OP INSTRUCTIONS YOU RECEIVED FROM YOUR SURGEON'S OFFICE!!!   Oral Hygiene is also important to reduce your risk of infection.        Remember - BRUSH YOUR TEETH THE MORNING OF SURGERY WITH YOUR REGULAR TOOTHPASTE  Do NOT smoke after Midnight the night before surgery.  STOP TAKING all Vitamins, Herbs and supplements 1 week before your surgery.   Take ONLY these medicines the morning of surgery with A SIP OF WATER : Tamsulosin , and you may EITHER Tylenol  OR Oxycodone  if needed for pain. You may use your Eye drops if needed.    DO NOT TAKE FUROSEMIDE  the morning of your surgery.   If You have been diagnosed with Sleep Apnea - Bring CPAP mask and tubing day of surgery. We will provide you with a CPAP machine on the day of your surgery.                   You may not have any metal on your body including  jewelry, and body piercing  Do not wear lotions, powders, cologne, or deodorant  Men may shave face and neck.  Contacts, Hearing Aids, dentures or bridgework may not be worn into surgery. DENTURES WILL BE REMOVED PRIOR TO SURGERY PLEASE DO NOT APPLY Poly grip OR ADHESIVES!!!  You may bring a small overnight bag with you on the day of surgery, only pack items that are not valuable.  IS NOT RESPONSIBLE   FOR VALUABLES THAT ARE LOST OR STOLEN.   Do not bring your  home medications to the hospital. The Pharmacy will dispense medications listed on your medication list to you during your admission in the Hospital.  Please read over the following fact sheets you were given: IF YOU HAVE QUESTIONS ABOUT YOUR PRE-OP INSTRUCTIONS, PLEASE CALL [phone].    Pre-operative 4 CHG Bath Instructions   You can play a key role in reducing the risk of infection after surgery. Your skin needs to be as free of germs as possible. You can reduce the number of germs on your skin by washing with CHG (chlorhexidine  gluconate) soap before surgery. CHG is an antiseptic soap that kills germs and continues to kill germs even after washing.   DO NOT use if you have an allergy to chlorhexidine /CHG or antibacterial soaps. If your skin becomes reddened or irritated, stop using the CHG and notify one of our RNs at   Please shower with the CHG soap starting 4 days before surgery using the following schedule:  FRIDAY  August 27, 2024    Please keep in mind the following:  DO NOT shave, including legs and underarms, starting the day of your first shower.   You may shave your face at any point before/day of surgery.  Place clean sheets on your bed the day you start using CHG soap. Use a clean washcloth (not used since being washed) for each shower. DO NOT sleep with pets once you start using the CHG.  CHG Shower Instructions:  If you choose to wash your hair and private area, wash first with your normal shampoo/soap.  After you use shampoo/soap, rinse your hair and body thoroughly to remove shampoo/soap residue.  Turn the water  OFF and apply about 3 tablespoons (45 ml) of CHG soap to a CLEAN washcloth.  Apply CHG soap ONLY FROM YOUR NECK DOWN TO YOUR TOES (washing for 3-5  minutes)  DO NOT use CHG soap on face, private areas, open wounds, or sores.  Pay special attention to the area where your surgery is being performed.  If you are having back surgery, having someone wash your  back for you may be helpful. Wait 2 minutes after CHG soap is applied, then you may rinse off the CHG soap.  Pat dry with a clean towel  Put on clean clothes/pajamas   If you choose to wear lotion, please use ONLY the CHG-compatible lotions on the back of this paper.     Additional instructions for the day of surgery: DO NOT APPLY any lotions, deodorants, cologne, or perfumes.   Put on clean/comfortable clothes.  Brush your teeth.  Ask your nurse before applying any prescription medications to the skin.   CHG Compatible Lotions   Aveeno Moisturizing lotion  Cetaphil Moisturizing Cream  Cetaphil Moisturizing Lotion  Clairol Herbal Essence Moisturizing Lotion, Dry Skin  Clairol Herbal Essence Moisturizing Lotion, Extra Dry Skin  Clairol Herbal Essence Moisturizing Lotion, Normal Skin  Curel Age Defying Therapeutic Moisturizing Lotion with Alpha Hydroxy  Curel Extreme Care Body Lotion  Curel Soothing Hands Moisturizing Hand Lotion  Curel Therapeutic Moisturizing Cream, Fragrance-Free  Curel Therapeutic Moisturizing Lotion, Fragrance-Free  Curel Therapeutic Moisturizing Lotion, Original Formula  Eucerin Daily Replenishing Lotion  Eucerin Dry Skin Therapy Plus Alpha Hydroxy Crme  Eucerin Dry Skin Therapy Plus Alpha Hydroxy Lotion  Eucerin Original Crme  Eucerin Original Lotion  Eucerin Plus Crme Eucerin Plus Lotion  Eucerin TriLipid Replenishing Lotion  Keri Anti-Bacterial Hand Lotion  Keri Deep Conditioning Original Lotion Dry Skin Formula Softly Scented  Keri Deep Conditioning Original Lotion, Fragrance Free Sensitive Skin Formula  Keri Lotion Fast Absorbing Fragrance Free Sensitive Skin Formula  Keri Lotion Fast Absorbing Softly Scented Dry Skin Formula  Keri Original Lotion  Keri Skin Renewal Lotion Keri Silky Smooth Lotion  Keri Silky Smooth Sensitive Skin Lotion  Nivea Body Creamy Conditioning Oil  Nivea Body Extra Enriched Lotion  Nivea Body Original Lotion  Nivea  Body Sheer Moisturizing Lotion Nivea Crme  Nivea Skin Firming Lotion  NutraDerm 30 Skin Lotion  NutraDerm Skin Lotion  NutraDerm Therapeutic Skin Cream  NutraDerm Therapeutic Skin Lotion  ProShield Protective Hand Cream  Provon moisturizing lotion   FAILURE TO FOLLOW THESE INSTRUCTIONS MAY RESULT IN THE CANCELLATION OF YOUR SURGERY  PATIENT SIGNATURE_________________________________  NURSE SIGNATURE__________________________________  ________________________________________________________________________         [name]    An incentive spirometer is a tool that can help keep your lungs clear and active. This tool measures how well you are filling your lungs with each breath. Taking long deep breaths may help reverse or decrease the chance of developing breathing (pulmonary) problems (especially infection) following: A long period of time when you are unable to move or be active. BEFORE THE PROCEDURE  If the spirometer includes an indicator to show your best effort, your nurse or respiratory therapist will set it to a desired goal. If possible, sit up straight or lean slightly forward. Try not to slouch. Hold the incentive spirometer in an upright position. INSTRUCTIONS FOR USE  Sit on the edge of your bed if possible, or sit up as far as you can in bed or on a chair. Hold the incentive spirometer in an upright position. Breathe out normally. Place the mouthpiece in your mouth and seal your lips tightly around it. Breathe in slowly and as deeply as possible, raising the piston or the ball  toward the top of the column. Hold your breath for 3-5 seconds or for as long as possible. Allow the piston or ball to fall to the bottom of the column. Remove the mouthpiece from your mouth and breathe out normally. Rest for a few seconds and repeat Steps 1 through 7 at least 10 times every 1-2 hours when you are awake. Take your time and take a few normal breaths between  deep breaths. The spirometer may include an indicator to show your best effort. Use the indicator as a goal to work toward during each repetition. After each set of 10 deep breaths, practice coughing to be sure your lungs are clear. If you have an incision (the cut made at the time of surgery), support your incision when coughing by placing a pillow or rolled up towels firmly against it. Once you are able to get out of bed, walk around indoors and cough well. You may stop using the incentive spirometer when instructed by your caregiver.  RISKS AND COMPLICATIONS Take your time so you do not get dizzy or light-headed. If you are in pain, you may need to take or ask for pain medication before doing incentive spirometry. It is harder to take a deep breath if you are having pain. AFTER USE Rest and breathe slowly and easily. It can be helpful to keep track of a log of your progress. Your caregiver can provide you with a simple table to help with this. If you are using the spirometer at home, follow these instructions: SEEK MEDICAL CARE IF:  You are having difficultly using the spirometer. You have trouble using the spirometer as often as instructed. Your pain medication is not giving enough relief while using the spirometer. You develop fever of 100.5 F (38.1 C) or higher.                                                                                                    SEEK IMMEDIATE MEDICAL CARE IF:  You cough up bloody sputum that had not been present before. You develop fever of 102 F (38.9 C) or greater. You develop worsening pain at or near the incision site. MAKE SURE YOU:  Understand these instructions. Will watch your condition. Will get help right away if you are not doing well or get worse. Document Released: 03/17/2007 Document Revised: 01/27/2012 Document Reviewed: 05/18/2007 [facility] Patient Information 2014 [place], [place].        If you would like to see a video about  joint replacement:   [email or web address]

## 2024-08-25 ENCOUNTER — Other Ambulatory Visit: Payer: Self-pay

## 2024-08-25 ENCOUNTER — Encounter (HOSPITAL_COMMUNITY)
Admission: RE | Admit: 2024-08-25 | Discharge: 2024-08-25 | Disposition: A | Discharge status: Home / Self Care | Source: Ambulatory Visit | Attending: Orthopedic Surgery | Admitting: Orthopedic Surgery

## 2024-08-25 VITALS — BP 148/82 | HR 84 | Temp 98.0°F | Resp 16 | Ht 74.0 in | Wt 260.0 lb

## 2024-08-25 DIAGNOSIS — K769 Liver disease, unspecified: Secondary | ICD-10-CM | POA: Diagnosis not present

## 2024-08-25 DIAGNOSIS — Z01812 Encounter for preprocedural laboratory examination: Secondary | ICD-10-CM | POA: Diagnosis present

## 2024-08-25 DIAGNOSIS — M1711 Unilateral primary osteoarthritis, right knee: Secondary | ICD-10-CM | POA: Insufficient documentation

## 2024-08-25 DIAGNOSIS — I10 Essential (primary) hypertension: Secondary | ICD-10-CM | POA: Diagnosis not present

## 2024-08-25 DIAGNOSIS — Z01818 Encounter for other preprocedural examination: Secondary | ICD-10-CM

## 2024-08-25 LAB — CBC
HCT: 49.8 % (ref 39.0–52.0)
Hemoglobin: 16.3 g/dL (ref 13.0–17.0)
MCH: 30.2 pg (ref 26.0–34.0)
MCHC: 32.7 g/dL (ref 30.0–36.0)
MCV: 92.4 fL (ref 80.0–100.0)
Platelets: 230 K/uL (ref 150–400)
RBC: 5.39 MIL/uL (ref 4.22–5.81)
RDW: 13.6 % (ref 11.5–15.5)
WBC: 6.3 K/uL (ref 4.0–10.5)
nRBC: 0 % (ref 0.0–0.2)

## 2024-08-25 LAB — COMPREHENSIVE METABOLIC PANEL WITH GFR
ALT: 68 U/L — ABNORMAL HIGH (ref 0–44)
AST: 56 U/L — ABNORMAL HIGH (ref 15–41)
Albumin: 4.6 g/dL (ref 3.5–5.0)
Alkaline Phosphatase: 82 U/L (ref 38–126)
Anion gap: 12 (ref 5–15)
BUN: 15 mg/dL (ref 8–23)
CO2: 26 mmol/L (ref 22–32)
Calcium: 10.4 mg/dL — ABNORMAL HIGH (ref 8.9–10.3)
Chloride: 102 mmol/L (ref 98–111)
Creatinine, Ser: 1.33 mg/dL — ABNORMAL HIGH (ref 0.61–1.24)
GFR, Estimated: 58 mL/min — ABNORMAL LOW (ref >60)
Glucose, Bld: 115 mg/dL — ABNORMAL HIGH (ref 70–99)
Potassium: 4.5 mmol/L (ref 3.5–5.1)
Sodium: 141 mmol/L (ref 135–145)
Total Bilirubin: 0.8 mg/dL (ref 0.0–1.2)
Total Protein: 7.7 g/dL (ref 6.5–8.1)

## 2024-08-25 LAB — SURGICAL PCR SCREEN
MRSA, PCR: NEGATIVE
Staphylococcus aureus: NEGATIVE

## 2024-08-31 ENCOUNTER — Ambulatory Visit (HOSPITAL_COMMUNITY)
Admission: RE | Admit: 2024-08-31 | Discharge: 2024-08-31 | Disposition: A | Discharge status: Home / Self Care | Attending: Orthopedic Surgery | Admitting: Orthopedic Surgery

## 2024-08-31 ENCOUNTER — Other Ambulatory Visit: Payer: Self-pay

## 2024-08-31 ENCOUNTER — Ambulatory Visit (HOSPITAL_BASED_OUTPATIENT_CLINIC_OR_DEPARTMENT_OTHER): Payer: Self-pay

## 2024-08-31 ENCOUNTER — Encounter (HOSPITAL_COMMUNITY)
Admission: RE | Disposition: A | Discharge status: Home / Self Care | Payer: Self-pay | Source: Home / Self Care | Attending: Orthopedic Surgery

## 2024-08-31 ENCOUNTER — Encounter (HOSPITAL_COMMUNITY): Payer: Self-pay | Admitting: Orthopedic Surgery

## 2024-08-31 ENCOUNTER — Ambulatory Visit (HOSPITAL_COMMUNITY): Payer: Self-pay | Admitting: Medical

## 2024-08-31 DIAGNOSIS — I10 Essential (primary) hypertension: Secondary | ICD-10-CM | POA: Diagnosis not present

## 2024-08-31 DIAGNOSIS — Z96651 Presence of right artificial knee joint: Secondary | ICD-10-CM

## 2024-08-31 DIAGNOSIS — M1711 Unilateral primary osteoarthritis, right knee: Secondary | ICD-10-CM | POA: Insufficient documentation

## 2024-08-31 DIAGNOSIS — G4733 Obstructive sleep apnea (adult) (pediatric): Secondary | ICD-10-CM | POA: Diagnosis not present

## 2024-08-31 DIAGNOSIS — I2510 Atherosclerotic heart disease of native coronary artery without angina pectoris: Secondary | ICD-10-CM | POA: Diagnosis not present

## 2024-08-31 HISTORY — PX: TOTAL KNEE ARTHROPLASTY: SHX125

## 2024-08-31 SURGERY — ARTHROPLASTY, KNEE, TOTAL
Anesthesia: Monitor Anesthesia Care | Site: Knee | Laterality: Right

## 2024-08-31 MED ORDER — TRANEXAMIC ACID-NACL 1000-0.7 MG/100ML-% IV SOLN
1000.0000 mg | Freq: Once | INTRAVENOUS | Status: AC
  Administered 2024-08-31: 1000 mg via INTRAVENOUS

## 2024-08-31 MED ORDER — PROPOFOL 10 MG/ML IV BOLUS
INTRAVENOUS | Status: DC | PRN
  Administered 2024-08-31: 30 mg via INTRAVENOUS

## 2024-08-31 MED ORDER — PHENYLEPHRINE 80 MCG/ML (10ML) SYRINGE FOR IV PUSH (FOR BLOOD PRESSURE SUPPORT)
PREFILLED_SYRINGE | INTRAVENOUS | Status: DC | PRN
Start: 2024-08-31 — End: 2024-08-31
  Administered 2024-08-31 (×5): 80 ug via INTRAVENOUS
  Administered 2024-08-31: 120 ug via INTRAVENOUS
  Administered 2024-08-31: 80 ug via INTRAVENOUS

## 2024-08-31 MED ORDER — ONDANSETRON HCL 4 MG/2ML IJ SOLN
INTRAMUSCULAR | Status: AC
  Filled 2024-08-31: qty 2

## 2024-08-31 MED ORDER — FENTANYL CITRATE (PF) 100 MCG/2ML IJ SOLN
INTRAMUSCULAR | Status: DC | PRN
  Administered 2024-08-31: 50 ug via INTRAVENOUS

## 2024-08-31 MED ORDER — SODIUM CHLORIDE (PF) 0.9 % IJ SOLN
INTRAMUSCULAR | Status: AC
  Filled 2024-08-31: qty 50

## 2024-08-31 MED ORDER — HYDROMORPHONE HCL 1 MG/ML IJ SOLN
INTRAMUSCULAR | Status: AC
  Filled 2024-08-31: qty 1

## 2024-08-31 MED ORDER — OXYCODONE HCL 5 MG PO TABS
ORAL_TABLET | ORAL | Status: AC
  Filled 2024-08-31: qty 1

## 2024-08-31 MED ORDER — PROPOFOL 10 MG/ML IV BOLUS
INTRAVENOUS | Status: AC
  Filled 2024-08-31: qty 20

## 2024-08-31 MED ORDER — SODIUM CHLORIDE 0.9 % IR SOLN
Status: DC | PRN
  Administered 2024-08-31: 1000 mL

## 2024-08-31 MED ORDER — LACTATED RINGERS IV SOLN: INTRAVENOUS | Status: DC

## 2024-08-31 MED ORDER — CHLORHEXIDINE GLUCONATE 0.12 % MT SOLN
15.0000 mL | Freq: Once | OROMUCOSAL | Status: AC
  Administered 2024-08-31: 15 mL via OROMUCOSAL

## 2024-08-31 MED ORDER — METHOCARBAMOL 500 MG PO TABS
500.0000 mg | ORAL_TABLET | Freq: Four times a day (QID) | ORAL | Status: DC | PRN
Start: 2024-08-31 — End: 2024-08-31

## 2024-08-31 MED ORDER — SODIUM CHLORIDE (PF) 0.9 % IJ SOLN
INTRAMUSCULAR | Status: DC | PRN
  Administered 2024-08-31: 61 mL

## 2024-08-31 MED ORDER — ORAL CARE MOUTH RINSE
15.0000 mL | Freq: Once | OROMUCOSAL | Status: AC
Start: 2024-08-31 — End: 2024-08-31

## 2024-08-31 MED ORDER — MIDAZOLAM HCL 2 MG/2ML IJ SOLN
INTRAMUSCULAR | Status: DC | PRN
  Administered 2024-08-31: 2 mg via INTRAVENOUS

## 2024-08-31 MED ORDER — KETOROLAC TROMETHAMINE 30 MG/ML IJ SOLN
INTRAMUSCULAR | Status: AC
  Filled 2024-08-31: qty 1

## 2024-08-31 MED ORDER — DEXAMETHASONE SOD PHOSPHATE PF 10 MG/ML IJ SOLN
INTRAMUSCULAR | Status: DC | PRN
  Administered 2024-08-31: 10 mg

## 2024-08-31 MED ORDER — OXYCODONE HCL 5 MG PO TABS
5.0000 mg | ORAL_TABLET | ORAL | Status: DC | PRN
  Administered 2024-08-31: 5 mg via ORAL

## 2024-08-31 MED ORDER — TRANEXAMIC ACID-NACL 1000-0.7 MG/100ML-% IV SOLN
1000.0000 mg | INTRAVENOUS | Status: AC
  Administered 2024-08-31: 1000 mg via INTRAVENOUS
  Filled 2024-08-31: qty 100

## 2024-08-31 MED ORDER — CEFAZOLIN SODIUM-DEXTROSE 2-4 GM/100ML-% IV SOLN: 2.0000 g | Freq: Four times a day (QID) | INTRAVENOUS | Status: DC

## 2024-08-31 MED ORDER — PROPOFOL 500 MG/50ML IV EMUL
INTRAVENOUS | Status: DC | PRN
Start: 2024-08-31 — End: 2024-08-31
  Administered 2024-08-31: 125 ug/kg/min via INTRAVENOUS

## 2024-08-31 MED ORDER — ROPIVACAINE HCL 5 MG/ML IJ SOLN
INTRAMUSCULAR | Status: DC | PRN
  Administered 2024-08-31: 30 mL via PERINEURAL

## 2024-08-31 MED ORDER — ONDANSETRON HCL 4 MG/2ML IJ SOLN
INTRAMUSCULAR | Status: DC | PRN
  Administered 2024-08-31: 4 mg via INTRAVENOUS

## 2024-08-31 MED ORDER — 0.9 % SODIUM CHLORIDE (POUR BTL) OPTIME
TOPICAL | Status: DC | PRN
  Administered 2024-08-31: 1000 mL

## 2024-08-31 MED ORDER — BUPIVACAINE IN DEXTROSE 0.75-8.25 % IT SOLN
INTRATHECAL | Status: DC | PRN
  Administered 2024-08-31: 2 mL via INTRATHECAL

## 2024-08-31 MED ORDER — LACTATED RINGERS IV BOLUS
500.0000 mL | Freq: Once | INTRAVENOUS | Status: AC
  Administered 2024-08-31: 500 mL via INTRAVENOUS

## 2024-08-31 MED ORDER — FENTANYL CITRATE (PF) 100 MCG/2ML IJ SOLN
INTRAMUSCULAR | Status: AC
  Filled 2024-08-31: qty 2

## 2024-08-31 MED ORDER — HYDROMORPHONE HCL 1 MG/ML IJ SOLN
0.5000 mg | INTRAMUSCULAR | Status: DC | PRN
  Administered 2024-08-31: 1 mg via INTRAVENOUS

## 2024-08-31 MED ORDER — PROPOFOL 1000 MG/100ML IV EMUL
INTRAVENOUS | Status: AC
  Filled 2024-08-31: qty 100

## 2024-08-31 MED ORDER — POVIDONE-IODINE 10 % EX SWAB: 2.0000 | Freq: Once | CUTANEOUS | Status: DC

## 2024-08-31 MED ORDER — DROPERIDOL 2.5 MG/ML IJ SOLN: 0.6250 mg | Freq: Once | INTRAMUSCULAR | Status: DC | PRN

## 2024-08-31 MED ORDER — METHOCARBAMOL 1000 MG/10ML IJ SOLN
500.0000 mg | Freq: Four times a day (QID) | INTRAMUSCULAR | Status: DC | PRN
Start: 2024-08-31 — End: 2024-08-31

## 2024-08-31 MED ORDER — BUPIVACAINE-EPINEPHRINE (PF) 0.25% -1:200000 IJ SOLN
INTRAMUSCULAR | Status: AC
  Filled 2024-08-31: qty 30

## 2024-08-31 MED ORDER — TRANEXAMIC ACID-NACL 1000-0.7 MG/100ML-% IV SOLN
INTRAVENOUS | Status: AC
  Filled 2024-08-31: qty 100

## 2024-08-31 MED ORDER — FENTANYL CITRATE (PF) 50 MCG/ML IJ SOSY: 25.0000 ug | PREFILLED_SYRINGE | INTRAMUSCULAR | Status: DC | PRN

## 2024-08-31 MED ORDER — MIDAZOLAM HCL 2 MG/2ML IJ SOLN
INTRAMUSCULAR | Status: AC
  Filled 2024-08-31: qty 2

## 2024-08-31 MED ORDER — PHENYLEPHRINE 80 MCG/ML (10ML) SYRINGE FOR IV PUSH (FOR BLOOD PRESSURE SUPPORT)
PREFILLED_SYRINGE | INTRAVENOUS | Status: AC
  Filled 2024-08-31: qty 10

## 2024-08-31 MED ORDER — CEFAZOLIN SODIUM-DEXTROSE 2-4 GM/100ML-% IV SOLN
2.0000 g | INTRAVENOUS | Status: AC
  Administered 2024-08-31: 2 g via INTRAVENOUS
  Filled 2024-08-31: qty 100

## 2024-08-31 MED ORDER — DEXAMETHASONE SOD PHOSPHATE PF 10 MG/ML IJ SOLN
8.0000 mg | Freq: Once | INTRAMUSCULAR | Status: AC
  Administered 2024-08-31: 8 mg via INTRAVENOUS

## 2024-08-31 SURGICAL SUPPLY — 44 items
ATTUNE MED ANAT PAT 38 KNEE (Knees) IMPLANT
BAG ZIPLOCK 12X15 (MISCELLANEOUS) ×1 IMPLANT
BASEPLATE TIB CMT FB PCKT SZ 8 (Knees) IMPLANT
BLADE SAW SGTL 11.0X1.19X90.0M (BLADE) IMPLANT
BLADE SAW SGTL 13.0X1.19X90.0M (BLADE) ×1 IMPLANT
BNDG ELASTIC 6INX 5YD STR LF (GAUZE/BANDAGES/DRESSINGS) ×1 IMPLANT
BOWL SMART MIX CTS (DISPOSABLE) ×1 IMPLANT
CEMENT HV SMART SET (Cement) ×2 IMPLANT
COMPONENT FEM CMT ATTUNE RT 6 (Joint) IMPLANT
COOLER ICEMAN CLASSIC (MISCELLANEOUS) IMPLANT
COVER SURGICAL LIGHT HANDLE (MISCELLANEOUS) ×1 IMPLANT
CUFF TRNQT CYL 34X4.125X (TOURNIQUET CUFF) ×1 IMPLANT
DERMABOND ADVANCED .7 DNX12 (GAUZE/BANDAGES/DRESSINGS) ×1 IMPLANT
DRAPE U-SHAPE 47X51 STRL (DRAPES) ×1 IMPLANT
DRESSING AQUACEL AG SP 3.5X10 (GAUZE/BANDAGES/DRESSINGS) ×1 IMPLANT
DURAPREP 26ML APPLICATOR (WOUND CARE) ×2 IMPLANT
ELECT REM PT RETURN 15FT ADLT (MISCELLANEOUS) ×1 IMPLANT
GLOVE BIO SURGEON STRL SZ 6 (GLOVE) ×1 IMPLANT
GLOVE BIOGEL PI IND STRL 6.5 (GLOVE) ×1 IMPLANT
GLOVE BIOGEL PI IND STRL 7.5 (GLOVE) ×1 IMPLANT
GLOVE ORTHO TXT STRL SZ7.5 (GLOVE) ×2 IMPLANT
GOWN STRL REUS W/ TWL LRG LVL3 (GOWN DISPOSABLE) ×2 IMPLANT
HOLDER FOLEY CATH W/STRAP (MISCELLANEOUS) IMPLANT
INSERT TIB MED ATTUNE 6 8 RT (Insert) IMPLANT
KIT TURNOVER KIT A (KITS) ×1 IMPLANT
MANIFOLD NEPTUNE II (INSTRUMENTS) ×1 IMPLANT
NDL SAFETY ECLIPSE 18X1.5 (NEEDLE) IMPLANT
NS IRRIG 1000ML POUR BTL (IV SOLUTION) ×1 IMPLANT
PACK TOTAL KNEE CUSTOM (KITS) ×1 IMPLANT
PAD COLD SHLDR WRAP-ON (PAD) IMPLANT
PENCIL SMOKE EVACUATOR (MISCELLANEOUS) ×1 IMPLANT
PIN FIX SIGMA LCS THRD HI (PIN) IMPLANT
PROTECTOR NERVE ULNAR (MISCELLANEOUS) ×1 IMPLANT
SET HNDPC FAN SPRY TIP SCT (DISPOSABLE) ×1 IMPLANT
SET PAD KNEE POSITIONER (MISCELLANEOUS) ×1 IMPLANT
SUT MNCRL AB 4-0 PS2 18 (SUTURE) ×1 IMPLANT
SUT STRATAFIX PDS+ 0 24IN (SUTURE) ×1 IMPLANT
SUT VIC AB 1 CT1 36 (SUTURE) ×1 IMPLANT
SUT VIC AB 2-0 CT1 TAPERPNT 27 (SUTURE) ×2 IMPLANT
TOWEL GREEN STERILE FF (TOWEL DISPOSABLE) ×1 IMPLANT
TRAY FOLEY MTR SLVR 16FR STAT (SET/KITS/TRAYS/PACK) ×1 IMPLANT
TUBE SUCTION HIGH CAP CLEAR NV (SUCTIONS) ×1 IMPLANT
WATER STERILE IRR 1000ML POUR (IV SOLUTION) ×2 IMPLANT
WRAP KNEE MAXI GEL POST OP (GAUZE/BANDAGES/DRESSINGS) ×1 IMPLANT

## 2024-08-31 NOTE — Evaluation (Signed)
 Physical Therapy Evaluation Patient Details Name: [name] MRN: [id number] DOB: 11-28-1955 Today's Date: 08/31/2024  History of Present Illness  68 yo male presents to therapy following R TKA on 08/31/2024 due to failure of conservative measures. Pt PMH includes but is not limited to: liver dysfunction, HTN, R biceps tendon rupture, CAD, OSA on CPAP, and L TKA (04/22/2024).  Clinical Impression    [name] is a 68 y.o. male POD 0 s/p T TKA. Patient reports IND with mobility at baseline. Patient is now limited by functional impairments (see PT problem list below) and requires CGA for transfers and gait with RW. Patient was able to ambulate 45 feet with RW and CGA and cues for safe walker management. Patient educated on safe sequencing for stair mobility with B UE support on R handrail, fall risk prevention, use of ice man machine, pain management and goal and car transfers pt and spouse verbalized understanding of safe guarding position for people assisting with mobility. Patient instructed in exercises to facilitate ROM and circulation reviewed and HO provided. Patient will benefit from continued skilled PT interventions to address impairments and progress towards PLOF. Patient has met mobility goals at adequate level for discharge home with family support and OPPT services scheduled for 10/17; will continue to follow if pt continues acute stay to progress towards Mod I goals.       If plan is discharge home, recommend the following: A little help with walking and/or transfers;A little help with bathing/dressing/bathroom;Assistance with cooking/housework;Help with stairs or ramp for entrance;Assist for transportation   Can travel by private vehicle        Equipment Recommendations None recommended by PT  Recommendations for Other Services       Functional Status Assessment Patient has had a recent decline in their functional status and demonstrates the ability to make  significant improvements in function in a reasonable and predictable amount of time.     Precautions / Restrictions Precautions Precautions: Knee;Fall Restrictions Weight Bearing Restrictions Per Provider Order: No      Mobility  Bed Mobility Overal bed mobility: Needs Assistance Bed Mobility: Supine to Sit     Supine to sit: Supervision     General bed mobility comments: min cues and HOB slightly elevated    Transfers Overall transfer level: Needs assistance Equipment used: Rolling walker (2 wheels) Transfers: Sit to/from Stand Sit to Stand: Contact guard assist           General transfer comment: min cues    Ambulation/Gait Ambulation/Gait assistance: Contact guard assist Gait Distance (Feet): 45 Feet Assistive device: Rolling walker (2 wheels) Gait Pattern/deviations: Step-to pattern, Decreased stance time - right, Antalgic, Trunk flexed Gait velocity: decreased     General Gait Details: slight trunk flexion with B UE support at RW to offload R LE in stance phase, min cues for safety and RW managment  Stairs Stairs: Yes Stairs assistance: Contact guard assist Stair Management: Two rails, One rail Right Number of Stairs: 3 General stair comments: min cues for safety, sequncing and technique with step to pattern, step navigation initally assessed with B handrails and pt progressed to B UE support on R handrail to emulate home setting  Wheelchair Mobility     Tilt Bed    Modified Rankin (Stroke Patients Only)       Balance Overall balance assessment: Needs assistance Sitting-balance support: Feet supported Sitting balance-Leahy Scale: Good     Standing balance support: Bilateral upper extremity supported, During functional  activity, Reliant on assistive device for balance Standing balance-Leahy Scale: Poor                               Pertinent Vitals/Pain Pain Assessment Pain Assessment: 0-10 Pain Score: 2  Pain Location: R  knee and LE Pain Descriptors / Indicators: Aching, Constant, Dull, Operative site guarding, Sore, Numbness Pain Intervention(s): Limited activity within patient's tolerance, Monitored during session, Premedicated before session, Repositioned, Ice applied    Home Living Family/patient expects to be discharged to:: Private residence Living Arrangements: Spouse/significant other Available Help at Discharge: Family Type of Home: House Home Access: Stairs to enter Entrance Stairs-Rails: Right Entrance Stairs-Number of Steps: 3   Home Layout: One level Home Equipment: Agricultural consultant (2 wheels);Cane - single point;Toilet riser;Tub bench;Crutches Additional Comments: iceman machine    Prior Function Prior Level of Function : Independent/Modified Independent             Mobility Comments: IND no AD for all ADLs, self care task and IADLs       Extremity/Trunk Assessment        Lower Extremity Assessment Lower Extremity Assessment: RLE deficits/detail RLE Deficits / Details: ankle DF/PF 5/5; SLR < 10 degree lag RLE Sensation: WNL    Cervical / Trunk Assessment Cervical / Trunk Assessment: Normal  Communication   Communication Communication: No apparent difficulties    Cognition Arousal: Alert Behavior During Therapy: WFL for tasks assessed/performed   PT - Cognitive impairments: No apparent impairments                         Following commands: Intact       Cueing       General Comments      Exercises Total Joint Exercises Ankle Circles/Pumps: AROM, Both, 10 reps Quad Sets: AROM, Right, 5 reps Short Arc Quad: AROM, Right, 5 reps Heel Slides: AROM, Right, 5 reps Hip ABduction/ADduction: AROM, Right, 5 reps Straight Leg Raises: AROM, Right, 5 reps Knee Flexion: AROM, Right, 5 reps, Seated   Assessment/Plan    PT Assessment Patient needs continued PT services  PT Problem List Decreased strength;Decreased range of motion;Decreased activity  tolerance;Decreased balance;Decreased mobility;Decreased coordination;Pain       PT Treatment Interventions DME instruction;Gait training;Stair training;Functional mobility training;Therapeutic activities;Therapeutic exercise;Balance training;Neuromuscular re-education;Patient/family education;Modalities    PT Goals (Current goals can be found in the Care Plan section)  Acute Rehab PT Goals Patient Stated Goal: to be able to play golf, hangout with the grandchildren PT Goal Formulation: With patient Time For Goal Achievement: 09/14/24 Potential to Achieve Goals: Good    Frequency 7X/week     Co-evaluation               AM-PAC PT 6 Clicks Mobility  Outcome Measure Help needed turning from your back to your side while in a flat bed without using bedrails?: None Help needed moving from lying on your back to sitting on the side of a flat bed without using bedrails?: None Help needed moving to and from a bed to a chair (including a wheelchair)?: A Little Help needed standing up from a chair using your arms (e.g., wheelchair or bedside chair)?: A Little Help needed to walk in hospital room?: A Little Help needed climbing 3-5 steps with a railing? : A Little 6 Click Score: 20    End of Session Equipment Utilized During Treatment: Gait belt Activity Tolerance: No  increased pain;Patient tolerated treatment well Patient left: in chair;with call bell/phone within reach;with family/visitor present Nurse Communication: Mobility status;Other (comment) (pt readiness for same day d/c from PT perspective) PT Visit Diagnosis: Unsteadiness on feet (R26.81);Other abnormalities of gait and mobility (R26.89);Muscle weakness (generalized) (M62.81);Difficulty in walking, not elsewhere classified (R26.2);Pain Pain - Right/Left: Right Pain - part of body: Knee;Leg    Time: 8664-8571 PT Time Calculation (min) (ACUTE ONLY): 53 min   Charges:   PT Evaluation $PT Eval Low Complexity: 1 Low PT  Treatments $Gait Training: 8-22 mins $Therapeutic Exercise: 8-22 mins $Therapeutic Activity: 8-22 mins PT General Charges $$ ACUTE PT VISIT: 1 Visit         [place], PT Acute Rehab   [place] VEAR Drone 08/31/2024, 3:33 PM

## 2024-08-31 NOTE — Anesthesia Postprocedure Evaluation (Signed)
 Anesthesia Post Note  Patient: [name]  Procedure(s) Performed: ARTHROPLASTY, KNEE, TOTAL (Right: Knee)     Patient location during evaluation: PACU Anesthesia Type: Regional, MAC and Spinal Level of consciousness: awake and alert Pain management: pain level controlled Vital Signs Assessment: post-procedure vital signs reviewed and stable Respiratory status: spontaneous breathing, nonlabored ventilation, respiratory function stable and patient connected to nasal cannula oxygen Cardiovascular status: stable and blood pressure returned to baseline Postop Assessment: no apparent nausea or vomiting Anesthetic complications: no   No notable events documented.  Last Vitals:  Vitals:   08/31/24 1300 08/31/24 1500  BP: (!) 133/91   Pulse: 66 69  Resp: 15 17  Temp:    SpO2: 97% 95%    Last Pain:  Vitals:   08/31/24 1443  TempSrc:   PainSc: 8                  Marinell Igarashi P Katrinia Straker

## 2024-08-31 NOTE — H&P (Signed)
 TOTAL KNEE ADMISSION H&P  Patient is being admitted for right total knee arthroplasty.  Therapy Plans: outpatient therapy [facility] [place] Disposition: Home with wife Planned DVT Prophylaxis: aspirin  81mg  BID DME needed: none PCP: Dr. Bertell clearance received Cardiac: Dr. Mona - (family hx, hx of syncope) TXA: IV Allergies: NKDA Anesthesia Concerns: none BMI: 33.8 Last HgbA1c: Not diabetic  Other:  - Staying overnight - Recent left TKA in June - did well - No hx of VTE or cancer - oxy, robaxin  ,tylenol , celebrex  - ICE MACHINE  Subjective:  Chief Complaint:right knee pain.  HPI: [name], 68 y.o. male, has a history of pain and functional disability in the right knee due to arthritis and has failed non-surgical conservative treatments for greater than 12 weeks to includeNSAID's and/or analgesics, corticosteriod injections, and activity modification.  Onset of symptoms was gradual, starting 2 years ago with gradually worsening course since that time. The patient noted no past surgery on the right knee(s).  Patient currently rates pain in the right knee(s) at 8 out of 10 with activity. Patient has worsening of pain with activity and weight bearing and pain that interferes with activities of daily living.  Patient has evidence of joint space narrowing by imaging studies. There is no active infection.  Patient Active Problem List   Diagnosis Date Noted   S/P total knee arthroplasty, left 04/22/2024   Fatty liver 01/06/2024   H/O adenomatous polyp of colon 01/06/2024   Splenomegaly 12/24/2023   Liver dysfunction 12/24/2023   Rotavirus enteritis 05/19/2022   AKI (acute kidney injury) 05/17/2022   Diarrhea 05/17/2022   Hypertriglyceridemia 05/17/2022   Class 1 obesity 05/17/2022   Essential hypertension 05/17/2022   Calculus of gallbladder without cholecystitis without obstruction    Elevated liver enzymes    Rupture of distal biceps tendon 01/30/2015   Hydrocele, right  03/18/2014   Past Medical History:  Diagnosis Date   Aortic valve insufficiency    Arthritis    Coronary artery disease    Elevated liver enzymes    History of kidney stones    Hypertension    Hypertriglyceridemia    OSA on CPAP    POST  SLEEP APNEA SURGERY --- PER SLEEP STUDY 06-26-15-2006  MODERATE OSA   USES CPAP DAILY   Pre-diabetes    Renal disorder    Scrotal swelling    Shortness of breath dyspnea    with exertion   Splenomegaly    Urgency of urination    OCCASIONAL    Past Surgical History:  Procedure Laterality Date   CHOLECYSTECTOMY N/A 08/07/2018   Procedure: LAPAROSCOPIC CHOLECYSTECTOMY;  Surgeon: Mavis Anes, MD;  Location: [facility];  Service: General;  Laterality: N/A;   COLONOSCOPY     COLONOSCOPY N/A 06/05/2018   Procedure: COLONOSCOPY;  Surgeon: Shaaron Lamar HERO, MD;  Location: [facility];  Service: Endoscopy;  Laterality: N/A;  7:30   DISTAL BICEPS TENDON REPAIR Right 01/30/2015   Procedure: DISTAL BICEPS TENDON REPAIR;  Surgeon: Anes JAYSON Herald, MD;  Location: [facility];  Service: Orthopedics;  Laterality: Right;   EXPLORATION LAPAROTOMY/  PARTIAL COLECTOMY WITH RESECTION TERMINAL ILEUM (APPENDICEAL MASS)  01/16/2010   BENIGN NEOPLASM APPENDIX   EXTRACORPOREAL SHOCK WAVE LITHOTRIPSY Right 12/17/2022   Procedure: EXTRACORPOREAL SHOCK WAVE LITHOTRIPSY (ESWL);  Surgeon: Sherrilee Belvie LITTIE, MD;  Location: [facility];  Service: Urology;  Laterality: Right;   HYDROCELE EXCISION Right 03/18/2014   Procedure:  RIGHT SPERMACELECTOMY;  Surgeon: Anes JAYSON Rafter, MD;  Location:  [facility];  Service: Urology;  Laterality: Right;   KNEE ARTHROSCOPY Right 11/18/2008   LITHOTRIPSY     LIVER BIOPSY  08/07/2018   Procedure: LIVER BIOPSY;  Surgeon: Mavis Anes, MD;  Location: [facility];  Service: General;;   POLYPECTOMY  06/05/2018   Procedure: POLYPECTOMY;  Surgeon: Shaaron Lamar HERO, MD;  Location: [facility];  Service: Endoscopy;;  ascending and descending    TONSILLECTOMY     TOTAL KNEE ARTHROPLASTY Left 04/22/2024   Procedure: ARTHROPLASTY, KNEE, TOTAL;  Surgeon: Ernie Cough, MD;  Location: [facility];  Service: Orthopedics;  Laterality: Left;   UVULOPALATOPHARYNGOPLASTY  2000 (APPROX)   W/  DEVIATED SEPTUM REPAIR  AND TONSILLECTOMY    Current Facility-Administered Medications  Medication Dose Route Frequency Provider Last Rate Last Admin   ceFAZolin  (ANCEF ) IVPB 2g/100 mL premix  2 g Intravenous On Call to OR Patti Rosina SAUNDERS, PA-C       dexamethasone  (DECADRON ) injection 8 mg  8 mg Intravenous Once Patti Rosina SAUNDERS, PA-C       lactated ringers  infusion   Intravenous Continuous Jefm Garnette LABOR, MD 10 mL/hr at 08/31/24 0618 Continued from Pre-op at 08/31/24 0618   povidone-iodine  10 % swab 2 Application  2 Application Topical Once Patti Rosina SAUNDERS, PA-C       tranexamic acid  (CYKLOKAPRON ) IVPB 1,000 mg  1,000 mg Intravenous To OR Patti Rosina SAUNDERS, PA-C       No Known Allergies  Social History   Tobacco Use   Smoking status: Never   Smokeless tobacco: Never  Substance Use Topics   Alcohol use: Not Currently    Comment: RARE    Family History  Problem Relation Age of Onset   Heart attack Mother    Heart attack Father    Heart attack Maternal Uncle    Colon cancer Neg Hx    Cirrhosis Neg Hx      Review of Systems  Constitutional:  Negative for chills and fever.  Respiratory:  Negative for cough and shortness of breath.   Cardiovascular:  Negative for chest pain.  Gastrointestinal:  Negative for nausea and vomiting.  Musculoskeletal:  Positive for arthralgias.     Objective:  Physical Exam Right knee exam: No palpable effusion, warmth erythema Slight flexion contracture with flexion of 110 degrees Tenderness medially  Vital signs in last 24 hours: Temp:  [97.7 F (36.5 C)] 97.7 F (36.5 C) (10/14 0538) Pulse Rate:  [68] 68 (10/14 0538) Resp:  [17] 17 (10/14 0538) BP: (157)/(98) 157/98 (10/14 0538) SpO2:  [95 %] 95  % (10/14 0538) Weight:  [117.9 kg] 117.9 kg (10/14 0541)  Labs:   Estimated body mass index is 33.38 kg/m as calculated from the following:   Height as of this encounter: 6' 2 (1.88 m).   Weight as of this encounter: 117.9 kg.   Imaging Review Plain radiographs demonstrate severe degenerative joint disease of the right knee(s). The overall alignment isneutral. The bone quality appears to be adequate for age and reported activity level.      Assessment/Plan:  End stage arthritis, right knee   The patient history, physical examination, clinical judgment of the provider and imaging studies are consistent with end stage degenerative joint disease of the right knee(s) and total knee arthroplasty is deemed medically necessary. The treatment options including medical management, injection therapy arthroscopy and arthroplasty were discussed at length. The risks and benefits of total knee arthroplasty were presented and reviewed. The risks due  to aseptic loosening, infection, stiffness, patella tracking problems, thromboembolic complications and other imponderables were discussed. The patient acknowledged the explanation, agreed to proceed with the plan and consent was signed. Patient is being admitted for inpatient treatment for surgery, pain control, PT, OT, prophylactic antibiotics, VTE prophylaxis, progressive ambulation and ADL's and discharge planning. The patient is planning to be discharged home.     Patient's anticipated LOS is less than 2 midnights, meeting these requirements: - Younger than 11 - Lives within 1 hour of care - Has a competent adult at home to recover with post-op recover - NO history of  - Chronic pain requiring opiods  - Diabetes  - Coronary Artery Disease  - Heart failure  - Heart attack  - Stroke  - DVT/VTE  - Cardiac arrhythmia  - Respiratory Failure/COPD  - Renal failure  - Anemia  - Advanced Liver disease  Rosina Calin, PA-C Orthopedic  Surgery [facility] [place] [phone]

## 2024-08-31 NOTE — Op Note (Signed)
 NAME:  [name]                      MEDICAL RECORD NO.:  [id number]                             FACILITY:  [facility]      PHYSICIAN:  Donnice BIRCH. Ernie, M.D.  DATE OF BIRTH:  1956-07-25      DATE OF PROCEDURE:  08/31/2024                                     OPERATIVE REPORT         PREOPERATIVE DIAGNOSIS:  Right knee osteoarthritis.      POSTOPERATIVE DIAGNOSIS:  Right knee osteoarthritis.      FINDINGS:  The patient was noted to have complete loss of cartilage and   bone-on-bone arthritis with associated osteophytes in the medial and patellofemoral compartments of   the knee with a significant synovitis and associated effusion.  The patient had failed months of conservative treatment including medications, injection therapy, activity modification.     PROCEDURE:  Right total knee replacement.      COMPONENTS USED:  DePuy Attune FB CR MS knee   system, a size 6 femur, 8 tibia, size 8 mm CR MS AOX insert, and 38 anatomic patellar   button.      SURGEON:  Donnice BIRCH. Ernie, M.D.      ASSISTANT:  Rosina Calin, PA-C.      ANESTHESIA:  Regional and Spinal.      SPECIMENS:  None.      COMPLICATION:  None.      DRAINS:  None.  EBL: <200 cc      TOURNIQUET TIME:  34 min at 225 mmHg     The patient was stable to the recovery room.      INDICATION FOR PROCEDURE:  [name] is a 68 y.o. male patient of   mine.  The patient had been seen, evaluated, and treated for months conservatively in the   office with medication, activity modification, and injections.  The patient had   radiographic changes of bone-on-bone arthritis with endplate sclerosis and osteophytes noted.  Based on the radiographic changes and failed conservative measures, the patient   decided to proceed with definitive treatment, total knee replacement.  Risks of infection, DVT, component failure, need for revision surgery, neurovascular injury were reviewed in the office setting.  The postop course was  reviewed stressing the efforts to maximize post-operative satisfaction and function.  Consent was obtained for benefit of pain   relief.      PROCEDURE IN DETAIL:  The patient was brought to the operative theater.   Once adequate anesthesia, preoperative antibiotics, 2 gm of Ancef ,1 gm of Tranexamic Acid , and 10 mg of Decadron  administered, the patient was positioned supine with a right thigh tourniquet placed.  The  right lower extremity was prepped and draped in sterile fashion.  A time-   out was performed identifying the patient, planned procedure, and the appropriate extremity.      The right lower extremity was placed in the [facility] leg holder.  The leg was   exsanguinated, tourniquet elevated to 225 mmHg.  A midline incision was   made followed by median parapatellar arthrotomy.  Following initial   exposure, attention was  first directed to the patella.  Precut   measurement was noted to be 26 mm.  I resected down to 15 mm and used a   38 anatomic patellar button to restore patellar height as well as cover the cut surface.      The lug holes were drilled and a metal shim was placed to protect the   patella from retractors and saw blade during the procedure.      At this point, attention was now directed to the femur.  The femoral   canal was opened with a drill, irrigated to try to prevent fat emboli.  An   intramedullary rod was passed at 5 degrees valgus, 9 mm of bone was   resected off the distal femur.  Following this resection, the tibia was   subluxated anteriorly.  Using the extramedullary guide, 2 mm of bone was resected off   the proximal medial tibia.  We confirmed the gap would be   stable medially and laterally with a size 5 spacer block as well as confirmed that the tibial cut was perpendicular in the coronal plane, checking with an alignment rod.      Once this was done, I sized the femur to be a size 6 in the anterior-   posterior dimension, chose a standard component  based on medial and   lateral dimension.  The size 6 rotation block was then pinned in   position anterior referenced using the C-clamp to set rotation.  The   anterior, posterior, and  chamfer cuts were made without difficulty nor   notching making certain that I was along the anterior cortex to help   with flexion gap stability.      The final box cut was made off the lateral aspect of distal femur.      At this point, the tibia was sized to be a size 8.  The size 8 tray was   then pinned in position through the medial third of the tubercle,   drilled, and keel punched.  Trial reduction was now carried with a 6 femur,  8 tibia, a size 8 mm CR MS insert, and the 38 anatomic patella botton.  The knee was brought to full extension with good flexion stability with the patella   tracking through the trochlea without application of pressure.  Given   all these findings the trial components removed.  Final components were   opened and cement was mixed.  The knee was irrigated with normal saline solution and pulse lavage.  The synovial lining was   then injected with 30 cc of 0.25% Marcaine  with epinephrine , 1 cc of Toradol  and 30 cc of NS for a total of 61 cc.     Final implants were then cemented onto cleaned and dried cut surfaces of bone with the knee brought to extension with a size 8 mm CR MS trial insert.      Once the cement had fully cured, excess cement was removed   throughout the knee.  I confirmed that I was satisfied with the range of   motion and stability, and the final size 8 mm CR MS AOX insert was chosen.  It was   placed into the knee.      The tourniquet had been let down at 34 minutes.  No significant   hemostasis was required.  The extensor mechanism was then reapproximated using #1 Vicryl and #1 Stratafix sutures with the knee   in flexion.  The   remaining wound was closed with 2-0 Vicryl and running 4-0 Monocryl.   The knee was cleaned, dried, dressed sterilely using  Dermabond and   Aquacel dressing.  The patient was then   brought to recovery room in stable condition, tolerating the procedure   well.   Please note that Physician Assistant, Rosina Calin, PA-C was present for the entirety of the case, and was utilized for pre-operative positioning, peri-operative retractor management, general facilitation of the procedure and for primary wound closure at the end of the case.              Donnice CORDOBA Ernie, M.D.    08/31/2024 7:17 AM

## 2024-08-31 NOTE — Anesthesia Procedure Notes (Signed)
 Spinal  Patient location during procedure: OR Start time: 08/31/2024 7:18 AM Reason for block: surgical anesthesia Staffing Performed: resident/CRNA  Anesthesiologist: Dorethea Cordella SQUIBB, DO Resident/CRNA: Metta Andrea NOVAK, CRNA Performed by: Metta Andrea NOVAK, CRNA Authorized by: Dorethea Cordella SQUIBB, DO   Preanesthetic Checklist Completed: patient identified, IV checked, site marked, risks and benefits discussed, surgical consent, monitors and equipment checked, pre-op evaluation and timeout performed Spinal Block Patient position: sitting Prep: DuraPrep and site prepped and draped Patient monitoring: continuous pulse ox, blood pressure, cardiac monitor and heart rate Approach: midline Location: L3-4 Injection technique: single-shot Needle Needle type: Pencan  Needle gauge: 24 G Needle length: 10 cm Assessment Events: CSF return Additional Notes Pt placed in sitting position, spinal kit expiration date checked and verified, timeout, + CSF, - heme, pt tolerated well. Dr Dorethea present and supervising throughout SAB placement.

## 2024-08-31 NOTE — Transfer of Care (Signed)
 Immediate Anesthesia Transfer of Care Note  Patient: [name]  Procedure(s) Performed: ARTHROPLASTY, KNEE, TOTAL (Right: Knee)  Patient Location: PACU  Anesthesia Type:Spinal  Level of Consciousness: awake, drowsy, and patient cooperative  Airway & Oxygen Therapy: Patient Spontanous Breathing and Patient connected to face mask oxygen  Post-op Assessment: Report given to RN and Post -op Vital signs reviewed and stable  Post vital signs: Reviewed and stable  Last Vitals:  Vitals Value Taken Time  BP 120/82 08/31/24 08:54  Temp    Pulse 79 08/31/24 08:55  Resp 20 08/31/24 08:55  SpO2 95 % 08/31/24 08:55  Vitals shown include unfiled device data.  Last Pain:  Vitals:   08/31/24 0541  TempSrc:   PainSc: 0-No pain         Complications: No notable events documented.

## 2024-08-31 NOTE — Anesthesia Preprocedure Evaluation (Signed)
 Anesthesia Evaluation  Patient identified by MRN, date of birth, ID band Patient awake    Reviewed: Allergy & Precautions, NPO status , Patient's Chart, lab work & pertinent test results  Airway Mallampati: II  TM Distance: >3 FB Neck ROM: Full    Dental no notable dental hx.    Pulmonary sleep apnea    Pulmonary exam normal        Cardiovascular hypertension, + CAD   Rhythm:Regular Rate:Normal     Neuro/Psych negative neurological ROS  negative psych ROS   GI/Hepatic negative GI ROS, Neg liver ROS,,,  Endo/Other  negative endocrine ROS    Renal/GU   negative genitourinary   Musculoskeletal  (+) Arthritis , Osteoarthritis,    Abdominal Normal abdominal exam  (+)   Peds  Hematology Lab Results      Component                Value               Date                      WBC                      6.3                 08/25/2024                HGB                      16.3                08/25/2024                HCT                      49.8                08/25/2024                MCV                      92.4                08/25/2024                PLT                      230                 08/25/2024             Lab Results      Component                Value               Date                      NA                       141                 08/25/2024                K  4.5                 08/25/2024                CO2                      26                  08/25/2024                GLUCOSE                  115 (H)             08/25/2024                BUN                      15                  08/25/2024                CREATININE               1.33 (H)            08/25/2024                CALCIUM                   10.4 (H)            08/25/2024                EGFR                     60                  01/19/2024                GFRNONAA                 58 (L)              08/25/2024               Anesthesia Other Findings   Reproductive/Obstetrics                              Anesthesia Physical Anesthesia Plan  ASA: 2  Anesthesia Plan: MAC, Regional and Spinal   Post-op Pain Management: Regional block*   Induction: Intravenous  PONV Risk Score and Plan: 1 and Ondansetron , Dexamethasone , Propofol  infusion, Midazolam  and Treatment may vary due to age or medical condition  Airway Management Planned: Simple Face Mask and Nasal Cannula  Additional Equipment: None  Intra-op Plan:   Post-operative Plan:   Informed Consent: I have reviewed the patients History and Physical, chart, labs and discussed the procedure including the risks, benefits and alternatives for the proposed anesthesia with the patient or authorized representative who has indicated his/her understanding and acceptance.     Dental advisory given  Plan Discussed with: CRNA  Anesthesia Plan Comments:         Anesthesia Quick Evaluation

## 2024-08-31 NOTE — Anesthesia Procedure Notes (Signed)
 Anesthesia Regional Block: Adductor canal block   Pre-Anesthetic Checklist: , timeout performed,  Correct Patient, Correct Site, Correct Laterality,  Correct Procedure, Correct Position, site marked,  Risks and benefits discussed,  Surgical consent,  Pre-op evaluation,  At surgeon's request and post-op pain management  Laterality: Right  Prep: Dura Prep       Needles:  Injection technique: Single-shot  Needle Type: Echogenic Stimulator Needle     Needle Length: 10cm  Needle Gauge: 20     Additional Needles:   Procedures:,,,, ultrasound used (permanent image in chart),,    Narrative:  Start time: 08/31/2024 6:53 AM End time: 08/31/2024 6:56 AM Injection made incrementally with aspirations every 5 mL.  Performed by: Personally  Anesthesiologist: Dorethea Cordella SQUIBB, DO  Additional Notes: Patient identified. Risks/Benefits/Options discussed with patient including but not limited to bleeding, infection, nerve damage, failed block, incomplete pain control. Patient expressed understanding and wished to proceed. All questions were answered. Sterile technique was used throughout the entire procedure. Please see nursing notes for vital signs. Aspirated in 5cc intervals with injection for negative confirmation. Patient was given instructions on fall risk and not to get out of bed. All questions and concerns addressed with instructions to call with any issues or inadequate analgesia.

## 2024-08-31 NOTE — Interval H&P Note (Signed)
 History and Physical Interval Note:  08/31/2024 7:03 AM  [name]  has presented today for surgery, with the diagnosis of Right knee osteoarthritis.  The various methods of treatment have been discussed with the patient and family. After consideration of risks, benefits and other options for treatment, the patient has consented to  Procedure(s): ARTHROPLASTY, KNEE, TOTAL (Right) as a surgical intervention.  The patient's history has been reviewed, patient examined, no change in status, stable for surgery.  I have reviewed the patient's chart and labs.  Questions were answered to the patient's satisfaction.     [name]

## 2024-08-31 NOTE — Discharge Instructions (Signed)

## 2024-08-31 NOTE — Anesthesia Procedure Notes (Signed)
 Procedure Name: MAC Date/Time: 08/31/2024 7:11 AM  Performed by: Metta Andrea NOVAK, CRNAPre-anesthesia Checklist: Patient identified, Emergency Drugs available, Suction available, Patient being monitored and Timeout performed Oxygen Delivery Method: Simple face mask Placement Confirmation: positive ETCO2

## 2024-09-01 ENCOUNTER — Encounter (HOSPITAL_COMMUNITY): Payer: Self-pay | Admitting: Orthopedic Surgery

## 2025-03-11 ENCOUNTER — Ambulatory Visit: Admitting: Urology
